# Patient Record
Sex: Male | Born: 1955 | Race: White | Hispanic: No | Smoking: Former smoker
Health system: Southern US, Community
[De-identification: ages and names within clinical notes are randomized; demographics above are authoritative.]

## PROBLEM LIST (undated history)

## (undated) DIAGNOSIS — IMO0002 Reserved for concepts with insufficient information to code with codable children: Secondary | ICD-10-CM

## (undated) DIAGNOSIS — L409 Psoriasis, unspecified: Secondary | ICD-10-CM

## (undated) DIAGNOSIS — D369 Benign neoplasm, unspecified site: Secondary | ICD-10-CM

## (undated) DIAGNOSIS — C61 Malignant neoplasm of prostate: Secondary | ICD-10-CM

## (undated) DIAGNOSIS — M87180 Osteonecrosis due to drugs, jaw: Secondary | ICD-10-CM

## (undated) DIAGNOSIS — K2970 Gastritis, unspecified, without bleeding: Secondary | ICD-10-CM

## (undated) DIAGNOSIS — I10 Essential (primary) hypertension: Secondary | ICD-10-CM

## (undated) DIAGNOSIS — Q631 Lobulated, fused and horseshoe kidney: Secondary | ICD-10-CM

## (undated) DIAGNOSIS — T50905A Adverse effect of unspecified drugs, medicaments and biological substances, initial encounter: Secondary | ICD-10-CM

## (undated) DIAGNOSIS — K219 Gastro-esophageal reflux disease without esophagitis: Secondary | ICD-10-CM

## (undated) DIAGNOSIS — J449 Chronic obstructive pulmonary disease, unspecified: Secondary | ICD-10-CM

## (undated) DIAGNOSIS — J45909 Unspecified asthma, uncomplicated: Secondary | ICD-10-CM

## (undated) DIAGNOSIS — M1990 Unspecified osteoarthritis, unspecified site: Secondary | ICD-10-CM

## (undated) DIAGNOSIS — F32A Depression, unspecified: Secondary | ICD-10-CM

## (undated) DIAGNOSIS — F419 Anxiety disorder, unspecified: Secondary | ICD-10-CM

## (undated) DIAGNOSIS — K5730 Diverticulosis of large intestine without perforation or abscess without bleeding: Secondary | ICD-10-CM

## (undated) DIAGNOSIS — I219 Acute myocardial infarction, unspecified: Secondary | ICD-10-CM

## (undated) DIAGNOSIS — F329 Major depressive disorder, single episode, unspecified: Secondary | ICD-10-CM

## (undated) DIAGNOSIS — I2510 Atherosclerotic heart disease of native coronary artery without angina pectoris: Secondary | ICD-10-CM

## (undated) DIAGNOSIS — K648 Other hemorrhoids: Secondary | ICD-10-CM

## (undated) HISTORY — PX: ANTERIOR FUSION CERVICAL SPINE: SUR626

## (undated) HISTORY — PX: OTHER SURGICAL HISTORY: SHX169

## (undated) HISTORY — DX: Lobulated, fused and horseshoe kidney: Q63.1

## (undated) HISTORY — DX: Diverticulosis of large intestine without perforation or abscess without bleeding: K57.30

## (undated) HISTORY — DX: Other hemorrhoids: K64.8

## (undated) HISTORY — DX: Chronic obstructive pulmonary disease, unspecified: J44.9

## (undated) HISTORY — DX: Reserved for concepts with insufficient information to code with codable children: IMO0002

## (undated) HISTORY — DX: Atherosclerotic heart disease of native coronary artery without angina pectoris: I25.10

## (undated) HISTORY — DX: Unspecified osteoarthritis, unspecified site: M19.90

## (undated) HISTORY — DX: Gastritis, unspecified, without bleeding: K29.70

## (undated) HISTORY — PX: POSTERIOR FUSION CERVICAL SPINE: SUR628

## (undated) HISTORY — DX: Malignant neoplasm of prostate: C61

## (undated) HISTORY — PX: APPENDECTOMY: SHX54

## (undated) HISTORY — DX: Anxiety disorder, unspecified: F41.9

## (undated) HISTORY — DX: Adverse effect of unspecified drugs, medicaments and biological substances, initial encounter: T50.905A

## (undated) HISTORY — DX: Essential (primary) hypertension: I10

## (undated) HISTORY — DX: Gastro-esophageal reflux disease without esophagitis: K21.9

## (undated) HISTORY — DX: Benign neoplasm, unspecified site: D36.9

## (undated) HISTORY — DX: Osteonecrosis due to drugs, jaw: M87.180

## (undated) HISTORY — DX: Depression, unspecified: F32.A

## (undated) HISTORY — DX: Psoriasis, unspecified: L40.9

## (undated) HISTORY — DX: Major depressive disorder, single episode, unspecified: F32.9

---

## 1999-08-23 ENCOUNTER — Encounter: Payer: Self-pay | Admitting: Internal Medicine

## 1999-08-23 ENCOUNTER — Ambulatory Visit (HOSPITAL_COMMUNITY): Admission: RE | Admit: 1999-08-23 | Discharge: 1999-08-23 | Payer: Self-pay | Admitting: Internal Medicine

## 1999-08-30 ENCOUNTER — Encounter: Payer: Self-pay | Admitting: Emergency Medicine

## 1999-08-30 ENCOUNTER — Emergency Department (HOSPITAL_COMMUNITY): Admission: EM | Admit: 1999-08-30 | Discharge: 1999-08-30 | Payer: Self-pay | Admitting: Emergency Medicine

## 2002-05-11 ENCOUNTER — Encounter: Payer: Self-pay | Admitting: Emergency Medicine

## 2002-05-11 ENCOUNTER — Emergency Department (HOSPITAL_COMMUNITY): Admission: EM | Admit: 2002-05-11 | Discharge: 2002-05-11 | Payer: Self-pay | Admitting: Emergency Medicine

## 2004-10-08 ENCOUNTER — Emergency Department (HOSPITAL_COMMUNITY): Admission: EM | Admit: 2004-10-08 | Discharge: 2004-10-08 | Payer: Self-pay | Admitting: Emergency Medicine

## 2005-09-27 ENCOUNTER — Ambulatory Visit (HOSPITAL_COMMUNITY): Admission: RE | Admit: 2005-09-27 | Discharge: 2005-09-27 | Payer: Self-pay | Admitting: Family Medicine

## 2005-11-19 ENCOUNTER — Ambulatory Visit: Payer: Self-pay | Admitting: Orthopedic Surgery

## 2005-11-23 ENCOUNTER — Ambulatory Visit (HOSPITAL_COMMUNITY): Admission: RE | Admit: 2005-11-23 | Discharge: 2005-11-23 | Payer: Self-pay | Admitting: Orthopedic Surgery

## 2005-12-13 ENCOUNTER — Ambulatory Visit: Payer: Self-pay | Admitting: Orthopedic Surgery

## 2005-12-21 ENCOUNTER — Ambulatory Visit (HOSPITAL_COMMUNITY): Admission: RE | Admit: 2005-12-21 | Discharge: 2005-12-21 | Payer: Self-pay | Admitting: Gastroenterology

## 2005-12-21 ENCOUNTER — Ambulatory Visit: Payer: Self-pay | Admitting: Gastroenterology

## 2005-12-25 ENCOUNTER — Encounter (INDEPENDENT_AMBULATORY_CARE_PROVIDER_SITE_OTHER): Payer: Self-pay | Admitting: *Deleted

## 2005-12-25 ENCOUNTER — Other Ambulatory Visit: Admission: RE | Admit: 2005-12-25 | Discharge: 2005-12-25 | Payer: Self-pay | Admitting: Urology

## 2006-01-11 ENCOUNTER — Encounter (HOSPITAL_COMMUNITY): Admission: RE | Admit: 2006-01-11 | Discharge: 2006-02-10 | Payer: Self-pay | Admitting: Urology

## 2006-02-28 HISTORY — PX: PROSTATECTOMY: SHX69

## 2006-07-19 ENCOUNTER — Encounter (HOSPITAL_COMMUNITY): Admission: RE | Admit: 2006-07-19 | Discharge: 2006-08-18 | Payer: Self-pay | Admitting: Oncology

## 2006-07-19 ENCOUNTER — Ambulatory Visit: Admission: RE | Admit: 2006-07-19 | Discharge: 2006-09-24 | Payer: Self-pay | Admitting: Radiation Oncology

## 2006-07-19 ENCOUNTER — Ambulatory Visit (HOSPITAL_COMMUNITY): Payer: Self-pay | Admitting: Oncology

## 2006-08-30 ENCOUNTER — Encounter (HOSPITAL_COMMUNITY): Admission: RE | Admit: 2006-08-30 | Discharge: 2006-09-29 | Payer: Self-pay | Admitting: Oncology

## 2006-10-11 ENCOUNTER — Ambulatory Visit (HOSPITAL_COMMUNITY): Payer: Self-pay | Admitting: Oncology

## 2006-10-11 ENCOUNTER — Encounter (HOSPITAL_COMMUNITY): Admission: RE | Admit: 2006-10-11 | Discharge: 2006-11-10 | Payer: Self-pay | Admitting: Oncology

## 2006-11-12 ENCOUNTER — Encounter (HOSPITAL_COMMUNITY): Admission: RE | Admit: 2006-11-12 | Discharge: 2006-12-12 | Payer: Self-pay | Admitting: Oncology

## 2006-12-09 ENCOUNTER — Ambulatory Visit (HOSPITAL_COMMUNITY): Admission: RE | Admit: 2006-12-09 | Discharge: 2006-12-09 | Payer: Self-pay | Admitting: Neurosurgery

## 2006-12-10 ENCOUNTER — Ambulatory Visit (HOSPITAL_COMMUNITY): Admission: RE | Admit: 2006-12-10 | Discharge: 2006-12-10 | Payer: Self-pay | Admitting: Family Medicine

## 2006-12-17 ENCOUNTER — Encounter (HOSPITAL_COMMUNITY): Admission: RE | Admit: 2006-12-17 | Discharge: 2007-01-16 | Payer: Self-pay | Admitting: Oncology

## 2006-12-17 ENCOUNTER — Ambulatory Visit (HOSPITAL_COMMUNITY): Payer: Self-pay | Admitting: Oncology

## 2007-03-11 ENCOUNTER — Inpatient Hospital Stay (HOSPITAL_COMMUNITY): Admission: RE | Admit: 2007-03-11 | Discharge: 2007-03-13 | Payer: Self-pay | Admitting: Neurosurgery

## 2007-04-15 ENCOUNTER — Ambulatory Visit (HOSPITAL_COMMUNITY): Payer: Self-pay | Admitting: Oncology

## 2007-04-15 ENCOUNTER — Encounter (HOSPITAL_COMMUNITY): Admission: RE | Admit: 2007-04-15 | Discharge: 2007-05-15 | Payer: Self-pay | Admitting: Oncology

## 2007-07-14 ENCOUNTER — Ambulatory Visit (HOSPITAL_COMMUNITY): Payer: Self-pay | Admitting: Oncology

## 2007-07-14 ENCOUNTER — Encounter (HOSPITAL_COMMUNITY): Admission: RE | Admit: 2007-07-14 | Discharge: 2007-08-13 | Payer: Self-pay | Admitting: Oncology

## 2007-08-11 ENCOUNTER — Ambulatory Visit (HOSPITAL_COMMUNITY): Admission: RE | Admit: 2007-08-11 | Discharge: 2007-08-11 | Payer: Self-pay | Admitting: Neurosurgery

## 2007-08-27 ENCOUNTER — Encounter (HOSPITAL_COMMUNITY): Admission: RE | Admit: 2007-08-27 | Discharge: 2007-09-26 | Payer: Self-pay | Admitting: Oncology

## 2007-08-30 ENCOUNTER — Ambulatory Visit (HOSPITAL_COMMUNITY): Admission: RE | Admit: 2007-08-30 | Discharge: 2007-08-30 | Payer: Self-pay | Admitting: Family Medicine

## 2007-10-02 ENCOUNTER — Encounter (HOSPITAL_COMMUNITY): Admission: RE | Admit: 2007-10-02 | Discharge: 2007-11-01 | Payer: Self-pay | Admitting: Neurosurgery

## 2007-10-08 ENCOUNTER — Encounter (HOSPITAL_COMMUNITY): Admission: RE | Admit: 2007-10-08 | Discharge: 2007-11-07 | Payer: Self-pay | Admitting: Oncology

## 2007-10-08 ENCOUNTER — Ambulatory Visit (HOSPITAL_COMMUNITY): Payer: Self-pay | Admitting: Oncology

## 2007-11-03 ENCOUNTER — Encounter (HOSPITAL_COMMUNITY): Admission: RE | Admit: 2007-11-03 | Discharge: 2007-12-03 | Payer: Self-pay | Admitting: Neurosurgery

## 2008-02-17 ENCOUNTER — Ambulatory Visit (HOSPITAL_COMMUNITY): Payer: Self-pay | Admitting: Oncology

## 2008-02-17 ENCOUNTER — Encounter (HOSPITAL_COMMUNITY): Admission: RE | Admit: 2008-02-17 | Discharge: 2008-03-01 | Payer: Self-pay | Admitting: Oncology

## 2008-04-01 ENCOUNTER — Ambulatory Visit (HOSPITAL_COMMUNITY): Admission: RE | Admit: 2008-04-01 | Discharge: 2008-04-01 | Payer: Self-pay | Admitting: Family Medicine

## 2008-05-24 ENCOUNTER — Encounter (HOSPITAL_COMMUNITY): Admission: RE | Admit: 2008-05-24 | Discharge: 2008-06-23 | Payer: Self-pay | Admitting: Oncology

## 2008-05-24 ENCOUNTER — Ambulatory Visit (HOSPITAL_COMMUNITY): Payer: Self-pay | Admitting: Oncology

## 2008-06-08 ENCOUNTER — Ambulatory Visit (HOSPITAL_COMMUNITY): Admission: RE | Admit: 2008-06-08 | Discharge: 2008-06-08 | Payer: Self-pay | Admitting: Family Medicine

## 2008-07-09 ENCOUNTER — Inpatient Hospital Stay (HOSPITAL_COMMUNITY): Admission: RE | Admit: 2008-07-09 | Discharge: 2008-07-10 | Payer: Self-pay | Admitting: Neurosurgery

## 2008-08-12 ENCOUNTER — Encounter (HOSPITAL_COMMUNITY): Admission: RE | Admit: 2008-08-12 | Discharge: 2008-09-11 | Payer: Self-pay | Admitting: Oncology

## 2008-08-12 ENCOUNTER — Ambulatory Visit (HOSPITAL_COMMUNITY): Payer: Self-pay | Admitting: Oncology

## 2008-11-22 ENCOUNTER — Ambulatory Visit (HOSPITAL_COMMUNITY): Payer: Self-pay | Admitting: Oncology

## 2008-11-22 ENCOUNTER — Encounter (HOSPITAL_COMMUNITY): Admission: RE | Admit: 2008-11-22 | Discharge: 2008-12-22 | Payer: Self-pay | Admitting: Oncology

## 2009-01-31 ENCOUNTER — Ambulatory Visit (HOSPITAL_COMMUNITY): Payer: Self-pay | Admitting: Oncology

## 2009-01-31 ENCOUNTER — Encounter (HOSPITAL_COMMUNITY): Admission: RE | Admit: 2009-01-31 | Discharge: 2009-03-02 | Payer: Self-pay | Admitting: Oncology

## 2009-02-08 ENCOUNTER — Ambulatory Visit (HOSPITAL_COMMUNITY): Admission: RE | Admit: 2009-02-08 | Discharge: 2009-02-08 | Payer: Self-pay | Admitting: Oncology

## 2009-05-02 ENCOUNTER — Ambulatory Visit (HOSPITAL_COMMUNITY): Payer: Self-pay | Admitting: Oncology

## 2009-05-02 ENCOUNTER — Encounter (HOSPITAL_COMMUNITY): Admission: RE | Admit: 2009-05-02 | Discharge: 2009-06-01 | Payer: Self-pay | Admitting: Oncology

## 2009-06-14 ENCOUNTER — Ambulatory Visit (HOSPITAL_COMMUNITY): Payer: Self-pay | Admitting: Oncology

## 2009-06-14 ENCOUNTER — Encounter (HOSPITAL_COMMUNITY): Admission: RE | Admit: 2009-06-14 | Discharge: 2009-07-14 | Payer: Self-pay | Admitting: Oncology

## 2009-08-03 ENCOUNTER — Ambulatory Visit (HOSPITAL_COMMUNITY): Payer: Self-pay | Admitting: Oncology

## 2009-08-10 ENCOUNTER — Encounter (HOSPITAL_COMMUNITY): Admission: RE | Admit: 2009-08-10 | Discharge: 2009-09-09 | Payer: Self-pay | Admitting: Oncology

## 2009-08-19 ENCOUNTER — Encounter (HOSPITAL_COMMUNITY): Admission: RE | Admit: 2009-08-19 | Discharge: 2009-09-18 | Payer: Self-pay | Admitting: Oncology

## 2009-09-13 ENCOUNTER — Encounter (HOSPITAL_COMMUNITY): Admission: RE | Admit: 2009-09-13 | Discharge: 2009-10-13 | Payer: Self-pay | Admitting: Oncology

## 2009-09-28 ENCOUNTER — Encounter (HOSPITAL_COMMUNITY): Admission: RE | Admit: 2009-09-28 | Discharge: 2009-10-28 | Payer: Self-pay | Admitting: Oncology

## 2009-09-28 ENCOUNTER — Ambulatory Visit (HOSPITAL_COMMUNITY): Payer: Self-pay | Admitting: Oncology

## 2009-10-14 ENCOUNTER — Encounter (HOSPITAL_COMMUNITY)
Admission: RE | Admit: 2009-10-14 | Discharge: 2009-11-13 | Payer: Self-pay | Source: Home / Self Care | Admitting: Oncology

## 2009-11-08 ENCOUNTER — Ambulatory Visit (HOSPITAL_COMMUNITY): Admission: RE | Admit: 2009-11-08 | Discharge: 2009-11-08 | Payer: Self-pay | Admitting: Neurosurgery

## 2009-11-15 ENCOUNTER — Emergency Department (HOSPITAL_COMMUNITY): Admission: EM | Admit: 2009-11-15 | Discharge: 2009-11-15 | Payer: Self-pay | Admitting: Emergency Medicine

## 2009-11-21 ENCOUNTER — Ambulatory Visit (HOSPITAL_COMMUNITY): Payer: Self-pay | Admitting: Oncology

## 2009-12-16 ENCOUNTER — Encounter (HOSPITAL_COMMUNITY): Admission: RE | Admit: 2009-12-16 | Discharge: 2010-01-15 | Payer: Self-pay | Admitting: Oncology

## 2010-01-13 ENCOUNTER — Ambulatory Visit (HOSPITAL_COMMUNITY): Payer: Self-pay | Admitting: Oncology

## 2010-03-14 ENCOUNTER — Encounter (HOSPITAL_COMMUNITY)
Admission: RE | Admit: 2010-03-14 | Discharge: 2010-04-13 | Payer: Self-pay | Source: Home / Self Care | Admitting: Oncology

## 2010-03-14 ENCOUNTER — Ambulatory Visit (HOSPITAL_COMMUNITY): Payer: Self-pay | Admitting: Oncology

## 2010-04-11 ENCOUNTER — Ambulatory Visit: Payer: Self-pay | Admitting: Gastroenterology

## 2010-04-11 DIAGNOSIS — K625 Hemorrhage of anus and rectum: Secondary | ICD-10-CM | POA: Insufficient documentation

## 2010-04-11 DIAGNOSIS — K219 Gastro-esophageal reflux disease without esophagitis: Secondary | ICD-10-CM | POA: Insufficient documentation

## 2010-05-03 ENCOUNTER — Ambulatory Visit (HOSPITAL_COMMUNITY)
Admission: RE | Admit: 2010-05-03 | Discharge: 2010-05-03 | Payer: Self-pay | Source: Home / Self Care | Admitting: Gastroenterology

## 2010-05-08 ENCOUNTER — Ambulatory Visit (HOSPITAL_COMMUNITY)
Admission: RE | Admit: 2010-05-08 | Discharge: 2010-05-08 | Payer: Self-pay | Source: Home / Self Care | Admitting: Family Medicine

## 2010-05-22 ENCOUNTER — Ambulatory Visit: Payer: Self-pay | Admitting: Internal Medicine

## 2010-05-23 ENCOUNTER — Encounter (INDEPENDENT_AMBULATORY_CARE_PROVIDER_SITE_OTHER): Payer: Self-pay | Admitting: *Deleted

## 2010-05-25 ENCOUNTER — Encounter: Payer: Self-pay | Admitting: Internal Medicine

## 2010-06-02 ENCOUNTER — Ambulatory Visit (HOSPITAL_COMMUNITY): Payer: Self-pay | Admitting: Oncology

## 2010-06-02 ENCOUNTER — Encounter (HOSPITAL_COMMUNITY)
Admission: RE | Admit: 2010-06-02 | Discharge: 2010-07-02 | Payer: Self-pay | Source: Home / Self Care | Attending: Oncology | Admitting: Oncology

## 2010-06-04 HISTORY — PX: OTHER SURGICAL HISTORY: SHX169

## 2010-06-07 ENCOUNTER — Emergency Department (HOSPITAL_COMMUNITY)
Admission: EM | Admit: 2010-06-07 | Discharge: 2010-06-08 | Payer: Self-pay | Source: Home / Self Care | Admitting: Emergency Medicine

## 2010-06-08 ENCOUNTER — Emergency Department (HOSPITAL_COMMUNITY)
Admission: RE | Admit: 2010-06-08 | Discharge: 2010-06-08 | Payer: Self-pay | Source: Home / Self Care | Attending: Internal Medicine | Admitting: Internal Medicine

## 2010-06-08 LAB — BASIC METABOLIC PANEL
BUN: 6 mg/dL (ref 6–23)
CO2: 19 mEq/L (ref 19–32)
Calcium: 8.8 mg/dL (ref 8.4–10.5)
Chloride: 111 mEq/L (ref 96–112)
Creatinine, Ser: 1.06 mg/dL (ref 0.4–1.5)
GFR calc Af Amer: >60 mL/min (ref >60)
GFR calc non Af Amer: >60 mL/min (ref >60)
Glucose, Bld: 95 mg/dL (ref 70–99)
Potassium: 3.6 mEq/L (ref 3.5–5.1)
Sodium: 141 mEq/L (ref 135–145)

## 2010-06-08 LAB — DIFFERENTIAL
Basophils Absolute: 0 10*3/uL (ref 0.0–0.1)
Basophils Relative: 0 % (ref 0–1)
Eosinophils Absolute: 0 10*3/uL (ref 0.0–0.7)
Eosinophils Relative: 1 % (ref 0–5)
Lymphocytes Relative: 24 % (ref 12–46)
Lymphs Abs: 1.6 10*3/uL (ref 0.7–4.0)
Monocytes Absolute: 0.6 10*3/uL (ref 0.1–1.0)
Monocytes Relative: 9 % (ref 3–12)
Neutro Abs: 4.2 10*3/uL (ref 1.7–7.7)
Neutrophils Relative %: 66 % (ref 43–77)

## 2010-06-08 LAB — POCT CARDIAC MARKERS
CKMB, poc: 1.8 ng/mL (ref 1.0–8.0)
Myoglobin, poc: 128 ng/mL (ref 12–200)
Troponin i, poc: <0.05 ng/mL (ref 0.00–0.09)

## 2010-06-08 LAB — CBC
HCT: 33.6 % — ABNORMAL LOW (ref 39.0–52.0)
Hemoglobin: 11.7 g/dL — ABNORMAL LOW (ref 13.0–17.0)
MCH: 30.7 pg (ref 26.0–34.0)
MCHC: 34.8 g/dL (ref 30.0–36.0)
MCV: 88.2 fL (ref 78.0–100.0)
Platelets: 260 10*3/uL (ref 150–400)
RBC: 3.81 MIL/uL — ABNORMAL LOW (ref 4.22–5.81)
RDW: 12.8 % (ref 11.5–15.5)
WBC: 6.4 10*3/uL (ref 4.0–10.5)

## 2010-06-08 LAB — D-DIMER, QUANTITATIVE: D-Dimer, Quant: 0.39 ug/mL-FEU (ref 0.00–0.48)

## 2010-06-21 ENCOUNTER — Encounter: Payer: Self-pay | Admitting: Cardiology

## 2010-06-21 ENCOUNTER — Ambulatory Visit
Admission: RE | Admit: 2010-06-21 | Discharge: 2010-06-21 | Payer: Self-pay | Source: Home / Self Care | Attending: Cardiology | Admitting: Cardiology

## 2010-06-22 ENCOUNTER — Encounter: Payer: Self-pay | Admitting: Cardiology

## 2010-06-28 ENCOUNTER — Inpatient Hospital Stay (HOSPITAL_COMMUNITY)
Admission: AD | Admit: 2010-06-28 | Discharge: 2010-07-04 | Disposition: A | Discharge status: Home / Self Care | Payer: Self-pay | Attending: Internal Medicine | Admitting: Internal Medicine

## 2010-06-28 ENCOUNTER — Ambulatory Visit (HOSPITAL_COMMUNITY): Admission: RE | Admit: 2010-06-28 | Payer: Self-pay | Source: Home / Self Care | Admitting: Cardiology

## 2010-06-28 ENCOUNTER — Encounter (HOSPITAL_COMMUNITY)
Admission: RE | Admit: 2010-06-28 | Discharge: 2010-07-04 | Payer: Self-pay | Source: Home / Self Care | Attending: Cardiology | Admitting: Cardiology

## 2010-06-28 ENCOUNTER — Emergency Department (HOSPITAL_COMMUNITY)
Admission: EM | Admit: 2010-06-28 | Discharge: 2010-06-28 | Disposition: A | Discharge status: Other Acute Inpatient Hospital | Payer: Self-pay | Source: Home / Self Care | Admitting: Emergency Medicine

## 2010-06-28 LAB — CBC
HCT: 34.6 % — ABNORMAL LOW (ref 39.0–52.0)
Hemoglobin: 12.1 g/dL — ABNORMAL LOW (ref 13.0–17.0)
MCH: 31.1 pg (ref 26.0–34.0)
MCHC: 35 g/dL (ref 30.0–36.0)
MCV: 88.9 fL (ref 78.0–100.0)
Platelets: 284 10*3/uL (ref 150–400)
RBC: 3.89 MIL/uL — ABNORMAL LOW (ref 4.22–5.81)
RDW: 12.7 % (ref 11.5–15.5)
WBC: 7.2 10*3/uL (ref 4.0–10.5)

## 2010-06-28 LAB — BASIC METABOLIC PANEL
BUN: 7 mg/dL (ref 6–23)
CO2: 23 mEq/L (ref 19–32)
Calcium: 8.6 mg/dL (ref 8.4–10.5)
Chloride: 103 mEq/L (ref 96–112)
Creatinine, Ser: 0.99 mg/dL (ref 0.4–1.5)
GFR calc Af Amer: >60 mL/min (ref >60)
GFR calc non Af Amer: >60 mL/min (ref >60)
Glucose, Bld: 118 mg/dL — ABNORMAL HIGH (ref 70–99)
Potassium: 3.4 mEq/L — ABNORMAL LOW (ref 3.5–5.1)
Sodium: 136 mEq/L (ref 135–145)

## 2010-06-28 LAB — POCT CARDIAC MARKERS
CKMB, poc: <1 ng/mL — ABNORMAL LOW (ref 1.0–8.0)
CKMB, poc: <1 ng/mL — ABNORMAL LOW (ref 1.0–8.0)
Myoglobin, poc: 51.1 ng/mL (ref 12–200)
Myoglobin, poc: 86.9 ng/mL (ref 12–200)
Troponin i, poc: <0.05 ng/mL (ref 0.00–0.09)
Troponin i, poc: <0.05 ng/mL (ref 0.00–0.09)

## 2010-06-28 LAB — PROTIME-INR
INR: 0.97 (ref 0.00–1.49)
Prothrombin Time: 13.1 seconds (ref 11.6–15.2)

## 2010-06-28 LAB — DIFFERENTIAL
Basophils Absolute: 0.1 10*3/uL (ref 0.0–0.1)
Basophils Relative: 1 % (ref 0–1)
Eosinophils Absolute: 0.3 10*3/uL (ref 0.0–0.7)
Eosinophils Relative: 4 % (ref 0–5)
Lymphocytes Relative: 46 % (ref 12–46)
Lymphs Abs: 3.3 10*3/uL (ref 0.7–4.0)
Monocytes Absolute: 0.5 10*3/uL (ref 0.1–1.0)
Monocytes Relative: 7 % (ref 3–12)
Neutro Abs: 3.1 10*3/uL (ref 1.7–7.7)
Neutrophils Relative %: 43 % (ref 43–77)

## 2010-06-28 LAB — MRSA PCR SCREENING: MRSA by PCR: NEGATIVE

## 2010-06-29 LAB — CBC
HCT: 34.1 % — ABNORMAL LOW (ref 39.0–52.0)
Hemoglobin: 11.2 g/dL — ABNORMAL LOW (ref 13.0–17.0)
MCH: 29.7 pg (ref 26.0–34.0)
MCHC: 32.8 g/dL (ref 30.0–36.0)
MCV: 90.5 fL (ref 78.0–100.0)
Platelets: 261 10*3/uL (ref 150–400)
RBC: 3.77 MIL/uL — ABNORMAL LOW (ref 4.22–5.81)
RDW: 12.8 % (ref 11.5–15.5)
WBC: 6.2 10*3/uL (ref 4.0–10.5)

## 2010-06-29 LAB — BASIC METABOLIC PANEL
BUN: 9 mg/dL (ref 6–23)
CO2: 25 mEq/L (ref 19–32)
Calcium: 8.1 mg/dL — ABNORMAL LOW (ref 8.4–10.5)
Chloride: 113 mEq/L — ABNORMAL HIGH (ref 96–112)
Creatinine, Ser: 0.98 mg/dL (ref 0.4–1.5)
GFR calc Af Amer: >60 mL/min (ref >60)
GFR calc non Af Amer: >60 mL/min (ref >60)
Glucose, Bld: 92 mg/dL (ref 70–99)
Potassium: 3.8 mEq/L (ref 3.5–5.1)
Sodium: 143 mEq/L (ref 135–145)

## 2010-06-30 ENCOUNTER — Encounter: Payer: Self-pay | Admitting: Internal Medicine

## 2010-06-30 ENCOUNTER — Encounter: Payer: Self-pay | Admitting: Gastroenterology

## 2010-06-30 NOTE — H&P (Addendum)
[name], [name] NO.:  [id number]  MEDICAL RECORD NO.:  [id number]          PATIENT TYPE:  EMS  LOCATION:  ED                            FACILITY:  [facility]  PHYSICIAN:  Jonelle Sidle, MD DATE OF BIRTH:  [birth year]  DATE OF ADMISSION:  06/28/2010 DATE OF DISCHARGE:  LH                             HISTORY & PHYSICAL   PRIMARY CARE PHYSICIAN:  [name], M.D.  ONCOLOGIST:  Ladona Horns. Neijstrom, MD.  REASON FOR ADMISSION:  Chest pain concerning for unstable angina.  HISTORY OF PRESENT ILLNESS:  [name] is a 55 year old male, recently seen by me in consultation in the office on June 21, 2010, with symptoms of chest pain and shortness of breath that have been progressive over the last several weeks.  These symptoms are described with both typical and atypical features, and noted in the setting of anxiety, and plan for a gastroenterology evaluation including EGD and colonoscopy based on additional reported symptoms of dysphagia and hematochezia.  In fact, he was originally referred to me in light of chest pain symptoms resulting in cancellation of an EGD and emergency department visit.  Please refer to my full consultation note from June 21, 2010, for additional details.  The patient was referred for a Myoview study, which was to be performed earlier today.  He presented for the initial rest portion of the study, and during this time began to experience chest discomfort consistent with his previous symptoms, left arm discomfort, diaphoresis, and anxiety.  This study was cancelled and he was transported from radiology to the emergency department at [facility] for further assessment.  He continues to complain of intermittent chest discomfort, although his ECG does not show any acute ST-segment changes, and initial point of care cardiac markers are normal.  I discussed the situation with the patient, and also by phone with Dr. Mariel Sleet.  Main  question remains as to whether his symptoms are related to significant obstructive underlying coronary artery disease.  We discussed transfer to [facility] to undergo a diagnostic cardiac catheterization, for clear assessment of the patient's coronary anatomy, and then determine best course of action from there.  The patient is in agreement to proceed, and Dr. Mariel Sleet reports no major reservations with this plan.  ALLERGIES:  ZOMETA.  Past medical history, social history, surgical history and family history were just completely reviewed in my cardiology consultation note from June 21, 2010, and will not be redictated here.  Please refer to this documented for full discussion.  There has otherwise been no major interval change.  REVIEW OF SYSTEMS:  The patient continues to have intermittent chest discomfort.  Denies any recent bloody stools on baseline reported history of hematochezia.  No cough, hemoptysis, fevers or chills. Reports stable appetite.  He does have intermittent anxiety symptoms.  PHYSICAL EXAMINATION:  VITAL SIGNS:  The patient is hemodynamically stable with temperature 97.9 degrees, blood pressure 118/81, heart rate 60 in sinus rhythm by telemetry and respirations 16 with oxygen saturation of 100% on 2 liters nasal cannula. GENERAL APPEARANCE:  He is a chronically ill-appearing male  in no acute distress. HEENT:  Conjunctivae and lids are normal.  Oropharynx with poor dentition. NECK:  Supple.  No elevated JVP or significant carotid bruits.  No thyromegaly. LUNGS:  Exhibit diminished breath sounds but nonlabored breathing at rest and no wheezing. CARDIAC:  Exam reveals a regular rate and rhythm, soft basal systolic murmur, preserved second heart sound, no S3. ABDOMEN:  Soft, nontender.  Bowel sounds present. SKIN:  Warm and dry, scattered ecchymoses on the forearms are less prominent compared to his prior exam.  No petechiae. MUSCULOSKELETAL:  No  kyphosis is noted. EXTREMITIES:  No pitting edema.  Distal pulses are 1+. NEUROPSYCHIATRIC:  The patient alert and oriented x3.  Affect appropriate to situation.  LABORATORY DATA:  WBC 7.2, hemoglobin 12.1, hematocrit 34.6, MCV 88.9, platelets 284.  Sodium 136, potassium 3.4, chloride 103, bicarb 23, glucose 118, BUN 7, creatinine 0.9.  Point of care CK-MB is less than 1.0.  Point of care troponin I is less than 0.05.  Chest x-ray from June 28, 2010, reports bronchitic and emphysematous changes with no acute abnormality.  ECG from today shows normal sinus rhythm at 63 beats per minute with some lead motion artifact and nonspecific T-wave changes.  IMPRESSION: 1. Progressive symptoms of chest pain and shortness of breath, also     associated with diaphoresis and anxiety as outlined above,     concerning for unstable angina, although associated with a     nonspecific electrocardiogram and normal cardiac markers.  Cardiac     risk factors include a significant family history of premature     cardiovascular disease, gender, and former tobacco use.  Following     his original office consultation, noninvasive cardiac testing was     arranged, however, this was not able to be completed related to     recurrent symptoms. 2. History of metastatic prostate cancer, stage IV, status post     radical prostatectomy in 2007, and reportedly quite stable with a     PSA of essentially 0 in close follow-up with Dr. Mariel Sleet. 3. Reported history of dysphasia and hematochezia, gastroenterology     workup deferred related to chest pain symptoms.  Hemoglobin is     noted to be 12.1 with normal MCV. 4. History of anxiety and depression. 5. Reported history of chronic obstructive pulmonary disease, prior     tobacco use. 6. History of degenerative disk disease status post anterior and     posterior cervical fusions. 7. Reported history of "easy bleeding", not on any antiplatelet drugs     long-term.   No proven bleeding diathesis, however.  I discussed     this with Dr. Mariel Sleet, and he voiced no major concerns with the     patient potentially being on aspirin and Plavix were the need to     arise.  This is of course a separate issue from having     gastroenterology clearance to use these medications pending further     evaluation.  PLAN:  I reviewed the situation in detail with the patient.  I also discussed things by phone with Dr. Mariel Sleet and Dr. Riley Kill in the Cardiac Catheterization Lab at [facility].  The plan at this point will be to transfer Mr. Lisbon to [facility] today in anticipation of a diagnostic cardiac catheterization to clearly document his coronary anatomy and help guide further evaluation and management.  If he has nonobstructive disease, then would search for other causes of chest pain and likely  proceed with a gastroenterology evaluation in light of his aforementioned symptoms of dysphagia and hematochezia.  If, on the other hand, he has significant obstructive disease in need of revascularization, then the situation becomes more complicated as he will need further discussion with gastroenterology as it relates to timing  of further assessment of his GI tract prior to potential use of antiplatelet  agents long-term.  The patient is being kept n.p.o. at this point and hydrated  in anticipation of his diagnostic cardiac catheterization later today.     Jonelle Sidle, MD     SGM/MEDQ  D:  06/28/2010  T:  06/28/2010  Job:  [id number]  cc:   [name], M.D. Fax: [phone]  Ladona Horns. Mariel Sleet, MD Fax: [phone]  Electronically Signed by Nona Dell MD on 06/28/2010 02:12:35 PM

## 2010-07-02 LAB — CBC
HCT: 36.7 % — ABNORMAL LOW (ref 39.0–52.0)
Hemoglobin: 11.8 g/dL — ABNORMAL LOW (ref 13.0–17.0)
MCH: 29.4 pg (ref 26.0–34.0)
MCHC: 32.2 g/dL (ref 30.0–36.0)
MCV: 91.3 fL (ref 78.0–100.0)
Platelets: 252 10*3/uL (ref 150–400)
RBC: 4.02 MIL/uL — ABNORMAL LOW (ref 4.22–5.81)
RDW: 12.7 % (ref 11.5–15.5)
WBC: 6.2 10*3/uL (ref 4.0–10.5)

## 2010-07-02 LAB — COMPREHENSIVE METABOLIC PANEL
ALT: 18 U/L (ref 0–53)
AST: 19 U/L (ref 0–37)
Albumin: 3.7 g/dL (ref 3.5–5.2)
Alkaline Phosphatase: 50 U/L (ref 39–117)
BUN: 7 mg/dL (ref 6–23)
CO2: 26 mEq/L (ref 19–32)
Calcium: 9 mg/dL (ref 8.4–10.5)
Chloride: 107 mEq/L (ref 96–112)
Creatinine, Ser: 1 mg/dL (ref 0.4–1.5)
GFR calc Af Amer: >60 mL/min (ref >60)
GFR calc non Af Amer: >60 mL/min (ref >60)
Glucose, Bld: 94 mg/dL (ref 70–99)
Potassium: 3.9 mEq/L (ref 3.5–5.1)
Sodium: 141 mEq/L (ref 135–145)
Total Bilirubin: 0.3 mg/dL (ref 0.3–1.2)
Total Protein: 6.2 g/dL (ref 6.0–8.3)

## 2010-07-02 LAB — TROPONIN I: Troponin I: 0.01 ng/mL (ref 0.00–0.06)

## 2010-07-04 ENCOUNTER — Encounter: Payer: Self-pay | Admitting: Gastroenterology

## 2010-07-04 LAB — BASIC METABOLIC PANEL
BUN: 7 mg/dL (ref 6–23)
CO2: 26 mEq/L (ref 19–32)
Calcium: 8.2 mg/dL — ABNORMAL LOW (ref 8.4–10.5)
Chloride: 110 mEq/L (ref 96–112)
Creatinine, Ser: 0.92 mg/dL (ref 0.4–1.5)
GFR calc Af Amer: >60 mL/min (ref >60)
GFR calc non Af Amer: >60 mL/min (ref >60)
Glucose, Bld: 94 mg/dL (ref 70–99)
Potassium: 4 mEq/L (ref 3.5–5.1)
Sodium: 145 mEq/L (ref 135–145)

## 2010-07-04 LAB — CBC
HCT: 32.8 % — ABNORMAL LOW (ref 39.0–52.0)
Hemoglobin: 10.8 g/dL — ABNORMAL LOW (ref 13.0–17.0)
MCH: 29.7 pg (ref 26.0–34.0)
MCHC: 32.9 g/dL (ref 30.0–36.0)
MCV: 90.1 fL (ref 78.0–100.0)
Platelets: 243 10*3/uL (ref 150–400)
RBC: 3.64 MIL/uL — ABNORMAL LOW (ref 4.22–5.81)
RDW: 12.8 % (ref 11.5–15.5)
WBC: 5.9 10*3/uL (ref 4.0–10.5)

## 2010-07-04 NOTE — Progress Notes (Signed)
  NAMEHAVEN, FOSS                ACCOUNT NO.:  [id number]  MEDICAL RECORD NO.:  [id number]          PATIENT TYPE:  EMS  LOCATION:  ED                            FACILITY:  [facility]  PHYSICIAN:  R. Roetta Sessions, M.D. DATE OF BIRTH:  Oct 28, 1955  DATE OF PROCEDURE: DATE OF DISCHARGE:  06/08/2010                                PROGRESS NOTE   INDICATIONS FOR PROCEDURE:  The patient is a 55 year old gentleman who was seen in our office recently for esophageal dysphagia and intermittent rectal bleeding and set up for EGD with possible esophageal dilation and colonoscopy for today.  He apparently is having trouble with dyspnea and chest pain and last night presented to the emergency department and spent some time in the emergency department overnight and was discharged early this morning. He did take his colonoscopy prep.  He presented today for short stay for his endoscopy and appeared to be in acute distress and complaining of severe shortness of breath, chest pain and numbness and weakness on his left side (upper and lower extremities).  The 02 saturation was 100% on presentation.  However he appears to be in acute distress and has a distant history of pulmonary embolus. I spoke to Dr. Aileen Pilot in the emergency department and I am sending Mr. Worton down to the emergency department for further evaluation.  For now the EGD and colonoscopy have been cancelled. We will regroup once his acute symptoms have been sorted out.     Jonathon Bellows, M.D.     RMR/MEDQ  D:  06/08/2010  T:  06/08/2010  Job:  [id number]  Electronically Signed by Lorrin Goodell M.D. on 07/04/2010 01:43:07 PM

## 2010-07-05 LAB — POCT ACTIVATED CLOTTING TIME: Activated Clotting Time: 404 seconds

## 2010-07-05 NOTE — Discharge Summary (Addendum)
[name] [name]                ACCOUNT NO.:  [id number]  MEDICAL RECORD NO.:  [id number]          [name] TYPE:  INP  LOCATION:  6526                         FACILITY:  [facility]  PHYSICIAN:  [name] [name], M.D.DATE OF BIRTH:  1956-03-26  DATE OF ADMISSION:  06/28/2010 DATE OF DISCHARGE:  07/04/2010                              DISCHARGE SUMMARY   DISCHARGE DIAGNOSES: 1. Unstable angina. 2. Newly diagnosed coronary artery disease, status post overlapping     drug-eluting stent placement to [name] proximal and mid left anterior     descending artery, July 03, 2010. 3. Gastritis/gastroesophageal reflux disease by EGD on June 30, 2010 with no evidence for Helicobacter pylori. 4. Tubular adenoma by pathology as well as internal hemorrhoids by     colonoscopy on June 30, 2010, initiated on PPI. 5. History of sigmoid diverticulosis. 6. Metastatic prostate cancer, stage IV, status post radical     prostatectomy in September 2007. 7. Degenerative joint disease, status post anterior and posterior     cervical fusion. 8. Anxiety/depression. 9. Chronic obstructive pulmonary disease. 10.Psoriasis. 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 12.Status post left foot surgery. 13.Status post appendectomy.  HOSPITAL COURSE:  [name] [name] is a 55 year old gentleman who presented to Dr. Ival [name] office with complaints of both typical and atypical chest discomfort.  He had undergone [name] prep and was n.p.o. for EGD and colonoscopy recently when he began experiencing significant chest discomfort.  He reports a 1 year history of intermittent chest pressure in [name] left to right side usually noted in [name] evening, sometimes waking him up at night.  He also felt muscle cramps in his midsternal area sporadically.  He did have significant family history of premature coronary artery disease.  This is also in [name] setting of tobacco abuse and elevated blood pressure.  EKG was reassuring, but it was  felt that he would need further cardiovascular evaluation to rule out ischemic symptoms.  He underwent Myoview testing.  During [name] resting portion of study, he began experience chest discomfort consistent with his previous symptoms with arm discomfort, diaphoresis, and anxiety.  [name] study was cancelled and he was subsequently transferred to [facility].  EKG did not show any acute ST-segment changes.  Decision was made to transfer [name] [name] to [facility] to undergo diagnostic cardiac cath to clearly assess [name] [name]'s coronary anatomy.  He was transferred and enzymes upon arrival to [facility] were negative.    He underwent diagnostic cath on June 28, 2010, which showed scattered coronary anatomy, abnormalities involving RCA with 40% proximal narrowing as well as moderately high-grade proximal mid LAD stenosis that did not appear to be critical, but likely flow limiting.  However, prior to proceeding with PCI, it was felt that [name] [name] would require GI evaluation to ensure stability for appropriateness of being on blood donner.  He underwent EGD and colonoscopy on June 30, 2010 with [name] above findings.  He was initiated initially on an H2 blocker, but this was changed to Protonix by Dr. Christella [name].  He felt that it was okay to use Plavix and [name] [name] subsequently  underwent IVUS of [name] LAD and overlapping Promus drug-eluting stent placement to [name] proximal and mid LAD by Dr. Excell [name].  After this procedure, [name] [name] was noted to have some confusion, but was not felt to have any signs of acute CVA.  This was helped by Ativan.  Dr. Deborah [name] felt that there may have been some component of narcotic withdrawal.  Because of his home regimen; however, on day of discharge, [name] confusion is resolved and [name] [name] was doing well.  Dr. Deborah [name] has seen and examined him today and feels he is stable for discharge.  [name] [name] also complained of left shoulder/arm pain  and was seen by Neurosurgery, Dr. Venetia [name].  MRI and plain films were obtained and [name] [name] will be instructed to follow his primary care doctor at followup for this problem.  Pain was stable on day of discharge.  DISCHARGE LABS:  WBC 5.9, hemoglobin 10.8, hematocrit 32.8, platelet count 243.  Sodium 145, potassium 4.0, chloride 110, CO2 26, glucose 94, BUN 7, creatinine 0.72.  LFTs within normal limits on July 02, 2010. Cardiac enzymes negative x2.  STUDIES: 1. Chest x-ray on June 28, 2010 showed bronchitic and emphysematous     changes.  No acute abnormalities. 2. MRI of [name] C-spine on June 29, 2010, showed interval additional     posterior body and pedicle screw fixation from C5 through C7.  [name]     ACDF is unchanged, [name] spinal canal is decompressed, mild right     foraminal stenosis with associated uncovertebral sprain at C5     through C6, mild left foraminal stenosis C6-C7 associated with     uncovertebral sprain, C7 through T1 right greater than left,     bilateral facet necrosis. 3. C-spine, plain film on June 29, 2010, showed C5-C6 and C6-C7     anterior posterior fusion with no adverse procedures.  No abnormal     motion identified in [name] C-spine. 4. Cardiac catheterization on June 28, 2010 and July 03, 2010,     please see above for summary as well full report for details.  DISCHARGE MEDICATIONS: 1. Aspirin 81 mg daily. 2. Plavix 75 mg daily. 3. Nitroglycerin sublingual 0.4 mg every 5 minutes as needed up to     three doses. 4. Pravachol 40 mg nightly. 5. Protonix 40 mg daily. 6. Toprol-XL 25 mg daily. 7. Clobex 0.05% spray one application b.i.d. as needed for psoriasis. 8. Gabapentin 300 mg two capsules nightly. 9. Lupron 3.75 mg injection intramuscularly monthly. 10.Oxycodone CR 20 mg q.12 h. 11.Percocet 10/325 mg every 6 hours as needed. 12.Taclonex one application topically daily. 13.Xanax 0.5 mg t.i.d.  DISPOSITION:  [name] [name] will be  discharged in stable condition to home. He is not to lift anything for 1 week or participate in sexual activity for 1 week.  He is not to drive for 3 days.  He is to follow a heart- healthy diet and if he notices any pain, swelling, bleeding or pus at his cath site, he is to call or return.  He will follow up with Dr. Diona Browner on July 13, 2010, as previously scheduled at 8:40 a.m. and then follow up with either Dr. Gerda Diss or [name] neurosurgeon to discuss his back and shoulder pain within 1 week.  DURATION OF DISCHARGE ENCOUNTER:  Greater than 30 minutes including physician and PA time.     Dayna Dunn, P.A.C.   ______________________________ [name] [name], M.D.    DD/MEDQ  D:  07/04/2010  T:  07/05/2010  Job:  [id number]  cc:   Ladona Horns. Mariel Sleet, MD Jonelle Sidle, MD [name], M.D.  Electronically Signed by Ronie Spies  on 07/05/2010 09:30:57 PM Electronically Signed by Roger Shelter M.D. on 07/25/2010 09:12:15 AM

## 2010-07-06 NOTE — Letter (Signed)
Summary: [facility]mill (Nuc Med Stress)  [facility]  [address], [place] [address]   Phone: [phone]  Fax: [phone]    Nuclear Medicine 1-Day Stress Test Information Sheet  Re:     [name] [name]   DOB:     1956/01/30 MRN:     [id number] Weight:  Appointment Date: Register at: Appointment Time: Referring MD:  _X__Exercise Stress  __Adenosine   __Dobutamine  __Lexiscan  __Persantine   __Thallium  Urgency: ____1 (next day)   ____2 (one week)    ____3 (PRN)  Patient will receive Follow Up call with results: Patient needs follow-up appointment:  Instructions regarding medication:  How to prepare for your stress test: 1. DO NOT eat or drink after midnight th night before test. This includes no caffeine (coffee, tea, sodas, chocolate) if you were instructed to take your medications, drink water with it. 2. DO NOT use any tobacco products for at leaset 8 hours prior to arrival. 3. DO NOT wear dresses or any clothing that may have metal clasps or buttons. 4. Wear short sleeve shirts, loose clothing, and comfortalbe walking shoes. 5. DO NOT use lotions, oils or powder on your chest before the test. 6. The test will take approximately 3-4 hours from the time you arrive until completion. 7. To register the day of the test, go to the Short Stay entrance at [facility]. 8. If you must cancel your test, call [phone] as soon as you are aware.  After you arrive for test:   When you arrive at [facility], you will go to Short Stay to be registered. They will then send you to Radiology to check in. The Nuclear Medicine Tech will get you and start an IV in your arm or hand. A small amount of a radioactive tracer will then be injected into your IV. This tracer will then have to circulate for 30-45 minutes. During this time you will wait in the waiting room and you will be able to drink something without caffeine. A series of pictures will be taken  of your heart follwoing this waiting period. After the 1st set of pictures you will go to the stress lab to get ready for your stress test. During the stress test, another small amount of a radioactive tracer will be injected through your IV. When the stress test is complete, there is a short rest period while your heart rate and blood pressure will be monitored. When this monitoring period is complete you will have another set of pictrues taken. (The same as the 1st set of pictures). These pictures are taken between 15 minutes and 1 hour after the stress test. The time depends on the type of stress test you had. Your doctor will inform you of your test results within 7 days after test.    The possibilities of certain changes are possible during the test. They include abnormal blood pressure and disorders of the heart. Side effects of persantine or adenosine can include flushing, chest pain, shortness of breath, stomach tightness, headache and light-headedness. These side effects usually do not last long and are self-resolving. Every effort will be made to keep you comfortable and to minimize complications by obtaining a medical history and by close observation during the test. Emergency equipment, medications, and trained personnel are available to deal with any unusual situation which may arise.  Please notify office at least 48 hours in advance if you are unable to keep this  appt.

## 2010-07-06 NOTE — Assessment & Plan Note (Signed)
Summary: RECTAL BLEEDING/LAW   Visit Type:  Initial Consult Referring Provider:  Neijstrom Primary Care Provider:  Lubertha South  CC:  rectal bleeding.  History of Present Illness: [name] [name] is a pleasant 55 year old male who presents today at the request of Dr. Mariel Sleet secondary to rectal bleeding. He has a hx significant for metastatic prostate ca stage IV, s/p radical prostatectomy by Dr. Margo Aye on 02/28/06. He reports that approximately 2 mos ago was the first incidence of noting blood in stool, bright red. He has a BM daily, soft, formed. Denies constipation. Denies abdominal pain or nausea. Continues to have intermittent brbpr. Also reports + esophageal dysphagia with tough textures X 4-5 months. Hx of GERD at least 3 years, started Ranitidine at that time and reports resolution of reflux. No prior hx of EGD, last TCS here per pt report at least 5 years ago? Chart not available during office visit for report. No loss of weight or appetite. Recent labs 03/27/10: H/H 12.7/37.2.,   Current Medications (verified): 1)  Alprazolam 0.5 Mg Tabs (Alprazolam) .... Take 1 Tablet By Mouth Three Times A Day 2)  Ranitidine Hcl 300 Mg Tabs (Ranitidine Hcl) .... Take 1 Tablet By Mouth Two Times A Day 3)  Spray For Psoriasis 4)  Cream For Psoriasis 5)  Percocet 10-325 Mg Tabs (Oxycodone-Acetaminophen) .... As Needed 6)  Oxycontin 20 Mg Xr12h-Tab (Oxycodone Hcl) .... One Tablet Every 12 Hours 7)  Gabapentin 300 Mg Caps (Gabapentin) .... Take Two Tablets At Bedtime 8)  Prolia 60 Mg/ml Soln (Denosumab) .... As Directed 9)  Lupron Depot 3.75 Mg Kit (Leuprolide Acetate) .... As Directed  Allergies (verified): 1)  ! * Zometa  Past History:  Past Medical History: Prostate ca osteoporosis DDD Anxiety Depression pamidronate-induced skin toxicity COPD (quit smoking 2 years ago) Psoriasis GERD  Past Surgical History: anterior cervical fusion posterior cervical fusion left foot  radical  prostatectomy  Family History: Father: deceased, lung ca Mother: living, alzheimers No FH of Colon Cancer:  Social History: Patient is a former smoker. quit 2 years ago hx of smoking X 15 years Alcohol Use - no Illicit Drug Use - no Occupation: part-time small jobs  Smoking Status:  quit Drug Use:  no  Review of Systems General:  Denies fever, chills, and anorexia. Eyes:  Denies blurring, diplopia, and discharge. ENT:  Denies sore throat, hoarseness, and difficulty swallowing. CV:  Denies chest pains, angina, syncope, and peripheral edema. Resp:  Denies dyspnea at rest, cough, and wheezing. GI:  Complains of difficulty swallowing and bloody BM's; denies pain on swallowing, nausea, indigestion/heartburn, abdominal pain, and constipation. GU:  Denies urinary burning, blood in urine, and urinary frequency. MS:  Denies joint pain / LOM, joint swelling, and joint deformity. Derm:  Denies rash, itching, and dry skin. Neuro:  Denies weakness, syncope, and frequent headaches. Psych:  Denies depression and anxiety. Endo:  Denies cold intolerance and heat intolerance.  Vital Signs:  Patient profile:   55 year old male Height:      69 inches Weight:      162.50 pounds BMI:     24.08 Temp:     97.4 degrees F oral Pulse rate:   68 / minute BP sitting:   120 / 88  (left arm) Cuff size:   regular  Vitals Entered By: Cloria Spring LPN (April 11, 2010 8:55 AM)  Physical Exam  General:  Well developed, well nourished, no acute distress. Head:  Normocephalic and atraumatic. Mouth:  No deformity  or lesions,dentures.  Lungs:  Clear throughout to auscultation. Heart:  Regular rate and rhythm; no murmurs, rubs,  or bruits. Abdomen:  normal bowel sounds, without guarding, without rebound, no distesion, no tenderness, no masses, and no hepatomegally or splenomegaly.   Msk:  Symmetrical with no gross deformities. Normal posture. Pulses:  Normal pulses noted. Extremities:  No clubbing,  cyanosis, edema or deformities noted. Neurologic:  Alert and  oriented x4;  grossly normal neurologically. Psych:  Alert and cooperative. Normal mood and affect.  Impression & Recommendations:  Problem # 1:  RECTAL BLEEDING (ICD-37.65)  55 year old male with brbpr X 2 mos, no abdominal pain, no N/V, no loss of appetite or weight. +BM daily. Recent labs 03/27/10: H/H 12.7/37.2., Last TCS approximately 5 years ago, report not currently available. Reportedly at this office. The risks and benefits have been discussed with the patient; he agrees to proceed and gave verbal consent.   TCS in OR secondary to polypharmacy with Dr. Darrick Penna.   Orders: Consultation Level III (98119)  Problem # 2:  DYSPHAGIA UNSPECIFIED (ICD-787.20)  +dysphagia x 4-5 months, hx of GERD for at least past 3 years, controlled on Ranitidine. diff dx include esophageal web, stricture, ring. Risks and benefits have been discussed and he desires to proceed.  EGD/ED in OR secondary to polypharmacy (along with TCS) Continue Ranitidine as reflux symptoms controlled.   Orders: Consultation Level III (14782)  Problem # 3:  GERD (ICD-530.81)  See #2.   Orders: Consultation Level III [phone])  Patient Instructions: 1)  Continue Ranitidine 2)  EGD/ED with TCS with Dr. Darrick Penna in OR secondary to polypharmacy 3)  Review results of prior TCS (reportedly done here 5+ years ago)  Appended Document: RECTAL BLEEDING/LAW last TCS December 21, 2005 with Dr. Darrick Penna: occasional sigmoid diverticulosis, no polyps, masses, inflammatory changes, normal rectum.

## 2010-07-06 NOTE — Letter (Signed)
Summary: Recall Radiology  [facility]  [address]   [place], [place] [address]   Phone: [phone]  Fax: [phone]    May 23, 2010  HOSIE SHARMAN [address] [place], [place]  [address] 02/12/1956   Dear Mr. [facility],   Our office needs to get you scheduled for your Colonoscopy/Upper Endoscopy. Please give our office a call to schedule this.  You may call the office at your convenience at [phone].  Please ask for the Referral Coordinator to make arrangements for this to be scheduled.  You may have to leave a message on our voice mail.  We will return your call.  If for any reason you do not wish to schedule this, please advise the office.  Please do not neglect your health.   Thank you,    Ave Filter  [facility] Ph: [phone]   Fax: [phone]

## 2010-07-06 NOTE — Letter (Signed)
Summary: tcs/egd with ed order  tcs/egd with ed order   Imported By: Ave Filter 05/22/2010 16:01:37  _____________________________________________________________________  External Attachment:    Type:   Image     Comment:   External Document

## 2010-07-06 NOTE — Assessment & Plan Note (Signed)
Summary: ***NP6 SYNCOPE, CHANGES IN BP, DEHYDRATION   Visit Type:  Initial Consult Referring Provider:  Dr. Glenford Peers Primary Provider:  Dr. Simone Curia   History of Present Illness: 55 year old male referred for cardiology consultation. Records indicate that he was seen recently in the emergency department with chest pain and dyspnea, also anxiety. He states that he had undergone a prep and was n.p.o. for an EGD and colonoscopy. He states that he began experiencing significant chest discomfort approximately 30 minutes after taking some medicines in the evening, specifically alprazolam, Zantac, and gabapentin. Symptoms reportedly continued into the next morning when he was sent to the emergency room.  Recent labs from 5 January showed WBCs 6.4, hemoglobin 11.7, platelets 260, d-dimer 0.39, sodium 141, potassium 3.6, BUN 6, creatinine 1.0, troponin I less than 0.05, CK-MB 1.8.  He reports a one-year history of intermittent chest "pressure" either on the left or right side, usually noted in the evening, sometimes waking him up at night, and present on the side on which he sleeps. Denies any clear reproducible exertional component to this. He also feels a "muscle cramp" in his midsternal area, very sporadic as well, noted over the last 3 months.  He reports a family history of cardiovascular disease stating that his father had cardiac disease diagnosed in his 36s, and that he more recently had a brother die at age 20 of a sudden MI.  He reports that he is metastatic prostate cancer has been stable over the past last few years, essentially with a PSA of zero, and close followup by Dr. Mariel Sleet. I confirmed this with Dr. Mariel Sleet by phone.  The also reports "easy bleeding" and has not been on antiplatelet drugs long-term. In addition to having dysphasia with plans for EGD, he also has had recent hematochezia with plans for colonoscopy.    Preventive Screening-Counseling &  Management  Alcohol-Tobacco     Smoking Status: quit  Current Medications (verified): 1)  Alprazolam 0.5 Mg Tabs (Alprazolam) .... Take 1 Tablet By Mouth Three Times A Day 2)  Ranitidine Hcl 300 Mg Tabs (Ranitidine Hcl) .... Take 1 Tablet By Mouth Two Times A Day 3)  Spray For Psoriasis 4)  Cream For Psoriasis 5)  Percocet 10-325 Mg Tabs (Oxycodone-Acetaminophen) .... As Needed 6)  Oxycontin 20 Mg Xr12h-Tab (Oxycodone Hcl) .... One Tablet Every 12 Hours 7)  Gabapentin 300 Mg Caps (Gabapentin) .... Take Two Tablets At Bedtime 8)  Prolia 60 Mg/ml Soln (Denosumab) .... As Directed 9)  Lupron Depot 3.75 Mg Kit (Leuprolide Acetate) .... As Directed  Allergies (verified): 1)  ! * Zometa  Comments:  Nurse/Medical Assistant: patient didint bring meds or list reviewed from what [facility]  Past History:  Family History: Last updated: 07/07/10 Father: lung cancer, died age 71 with CAD Mother: Alzheimer's dementia Siblings: Brother died age 55 with MI Also maternal uncles with CAD  Social History: Last updated: 2010/07/07 Alcohol Use - no Illicit Drug Use - no Occupation: part-time small jobs  Tobacco Use - Former.   Past Medical History: Metastatic prostate cancer stage IV s/p radical prostatectomy 9/07 Sigmoid diverticulosis DDD Anxiety Depression Pamidronate - induced skin toxicity Zometa - induced toxicity COPD Psoriasis GERD - history of dysphasia Horseshoe kidney - single kidney  Past Surgical History: Anterior cervical fusion Posterior cervical fusion Left foot surgery Radical prostatectomy Appendectomy  Family History: Father: lung cancer, died age 57 with CAD Mother: Alzheimer's dementia Siblings: Brother died age 81 with MI Also maternal  uncles with CAD  Social History: Alcohol Use - no Illicit Drug Use - no Occupation: part-time small jobs  Tobacco Use - Former.   Review of Systems       The patient complains of chest  pain and hematochezia.  The patient denies anorexia, fever, weight gain, syncope, dyspnea on exertion, peripheral edema, prolonged cough, hemoptysis, and melena.         Otherwise negative except as outlined above.  Vital Signs:  Patient profile:   55 year old male Weight:      164 pounds BMI:     24.31 Pulse rate:   67 / minute BP sitting:   146 / 94  (left arm)  Vitals Entered By: Dreama Saa, CNA (June 21, 2010 2:48 PM)  Physical Exam  Additional Exam:  Chronically ill-appearing male in no acute distress. HEENT: Conjunctiva and lids normal, oropharynx with poor dentition. Neck: Supple, no elevated JVP or loud bruits, no thyromegaly. Lungs: Diminished breath sounds, nonlabored, no wheezing. Excellent cardiac: Regular rate and rhythm, soft systolic murmur at the base, but her second heart sound, no S3. Abdomen: Soft, nontender, bowel sounds present. Skin: Warm and dry, somewhat friable and thin, scattered ecchymoses particularly in the forearms. Musculoskeletal: No kyphosis. Extremities: No pitting edema, distal pulses one plus. Neuropsychiatric: Alert and oriented x3, somewhat anxious, affect appropriate.   CXR  Procedure date:  06/08/2010  Findings:      Findings:   Normal heart size, mediastinal contours, and pulmonary vascularity.   Minimal chronic bronchitic changes.   Lungs otherwise clear.   No pleural effusion or pneumothorax.   Prior cervical spine fusion.    IMPRESSION:   Minimal chronic bronchitic changes.   No acute abnormalities.   EKG  Procedure date:  06/21/2010  Findings:      Sinus bradycardia at 58 beats per minutes, sinus arrhythmia, no significant ST segment changes.  Impression & Recommendations:  Problem # 1:  CHEST PAIN (ICD-786.50)  Fairly long-term history of chest pain, more notable over the last few months, and recently culminating in evaluation in the emergency department in association with shortness of breath and anxiety.  Characterized by both typical and atypical features, and noted in the setting of prior history of tobacco use, elevated blood pressure today, and some family history of premature cardiovascular disease. He denies any prior ischemic testing. ECG today is reassuring. We discussed the matter today, and reviewed diagnostic options, including both noninvasive and invasive techniques. Frankly at this point I am concerned about his bleeding risk, and ability to take antiplatelet therapy were we to find obstructive CAD amenable to revascularization. I spoke with Dr. Mariel Sleet about this, and he reported no major reservation about the patient using either aspirin or Plavix. Having said that, he has still not undergone formal gastroenterology evaluation with history of reported hematochezia and dysphasia. It would be important to exclude a bleeding source prior to initiating long-term antiplatelet therapy. In view of these issues and complexity, plan will be to proceed with noninvasive cardiac testing first, specifically an exercise Myoview and echocardiogram, to review ischemic burden and overall risk. I will then bring him back in the office and discuss things further, and formulate a plan going forward.  Orders: Nuclear Stress Test (Nuc Stress Test) 2-D Echocardiogram (2D Echo)  Problem # 2:  R/O HYPERLIPIDEMIA (ICD-272.4)  Uncertain lipid status. Plan followup fasting lipid profile to assess.  Orders: T-Lipid Profile [phone]) T-Hepatic Function [phone])  Problem # 3:  PROSTATE CANCER (ICD-185)  Stage IV metastatic prostate cancer, reportedly well controlled at this time with PSA essentially at zero. Followed by Dr. Mariel Sleet.  Patient Instructions: 1)  Your physician recommends that you schedule a follow-up appointment in: 2 to 3 weeks 2)  Your physician recommends that you return for lab work in: This week 3)  Your physician recommends that you continue on your current medications as  directed. Please refer to the Current Medication list given to you today. 4)  Your physician has requested that you have an echocardiogram.  Echocardiography is a painless test that uses sound waves to create images of your heart. It provides your doctor with information about the size and shape of your heart and how well your heart's chambers and valves are working.  This procedure takes approximately one hour. There are no restrictions for this procedure. 5)  Your physician has requested that you have an exercise stress myoview.  For further information please visit [email or web address]  Please follow instruction sheet, as given.

## 2010-07-06 NOTE — Procedures (Signed)
Summary: Colonoscopy  Patient: [name] [name] Note: All result statuses are Final unless otherwise noted.  Tests: (1) Colonoscopy (COL)   COL Colonoscopy           DONE     [facility]     [address]     [place], [place]  [address]           COLONOSCOPY PROCEDURE REPORT     PATIENT:  [name], [name]  MR#:  [id number]     BIRTHDATE:  1956/03/24, 54 yrs. old  GENDER:  male     ENDOSCOPIST:  Rachael Fee, MD     REF. BY:  Arturo Morton. Riley Kill, M.D.     PROCEDURE DATE:  06/30/2010     PROCEDURE:  Colonoscopy with snare polypectomy     ASA CLASS:  Class II     INDICATIONS:  minor, intermittent rectal bleeding     MEDICATIONS:   Fentanyl 100 mcg IV, Versed 10 mg IV, Benadryl 25     mg IV     DESCRIPTION OF PROCEDURE:   After the risks benefits and     alternatives of the procedure were thoroughly explained, informed     consent was obtained.  Digital rectal exam was performed and     revealed no rectal masses.   The [id number] ([id number]) endoscope was     introduced through the anus and advanced to the cecum, which was     identified by both the appendix and ileocecal valve, without     limitations.  The quality of the prep was good, using MoviPrep.     The instrument was then slowly withdrawn as the colon was fully     examined.     <<PROCEDUREIMAGES>>     FINDINGS:  A sessile polyp was found in the distal transverse     colon. This was 4mm across, removed with cold snare and sent to     pathology (jar 1) (see image004).  Internal hemorrhoids were     found. These were small, not thrombosed.  This was otherwise a     normal examination of the colon (see image006, image001, and     image002).   Retroflexed views in the rectum revealed no     abnormalities.    The scope was then withdrawn from the patient     and the procedure completed.     COMPLICATIONS:  None           ENDOSCOPIC IMPRESSION:     1) Small essile polyp in the distal transverse colon, removed   and sent to pathology     2) Internal hemorrhoids     3) Otherwise normal examination           RECOMMENDATIONS:     1) If the polyp(s) removed today are proven to be adenomatous     (pre-cancerous) polyps, you will need a repeat colonoscopy in 5     years. Otherwise you should continue to follow colorectal cancer     screening guidelines for "routine risk" patients with colonoscopy     in 10 years.     2) You will receive a letter within 1-2 weeks with the results     of your biopsy as well as final recommendations. Please call my     office if you have not received a letter after 3 weeks.     3) EGD now.  ______________________________     Rachael Fee, MD           n.     eSIGNED:   Rachael Fee at 06/30/2010 10:41 AM           Doylene Bode, [id number]  Note: An exclamation mark (!) indicates a result that was not dispersed into the flowsheet. Document Creation Date: 06/30/2010 10:42 AM _______________________________________________________________________  (1) Order result status: Final Collection or observation date-time: 06/30/2010 10:37 Requested date-time:  Receipt date-time:  Reported date-time:  Referring Physician:   Ordering Physician: Rob Bunting [phone]) Specimen Source:  Source: Launa Grill Order Number: [id number] Lab site:

## 2010-07-06 NOTE — Letter (Signed)
Summary: TCS/EGD/ED ORDER  TCS/EGD/ED ORDER   Imported By: Ave Filter 05/25/2010 09:39:31  _____________________________________________________________________  External Attachment:    Type:   Image     Comment:   External Document  Appended Document: TCS/EGD/ED ORDER Pt wants to f/u with his pcp first. He will call back after that ov and reschedule his procedure.

## 2010-07-06 NOTE — Letter (Signed)
Summary: records from [facility]  records from [facility]   Imported By: Faythe Ghee 06/22/2010 11:01:09  _____________________________________________________________________  External Attachment:    Type:   Image     Comment:   External Document

## 2010-07-06 NOTE — Procedures (Signed)
Summary: Upper Endoscopy  Patient: [name] Note: All result statuses are Final unless otherwise noted.  Tests: (1) Upper Endoscopy (EGD)   EGD Upper Endoscopy       DONE     [facility]     [address]     [place], [place]  [address]           ENDOSCOPY PROCEDURE REPORT           PATIENT:  [name] [name], [name] [name]  MR#:  [id number]     BIRTHDATE:  04/25/1956, 54 yrs. old  GENDER:  male           ENDOSCOPIST:  Rachael Fee, MD     PROCEDURE DATE:  06/30/2010     PROCEDURE:  EGD with biopsy, [id number]     ASA CLASS:  Class II     INDICATIONS:  intermittent dysphagia, minor rectal bleeding           MEDICATIONS:  There was residual sedation effect present from     prior procedure., Versed 2 mg IV     TOPICAL ANESTHETIC:  Cetacaine Spray           DESCRIPTION OF PROCEDURE:   After the risks benefits and     alternatives of the procedure were thoroughly explained, informed     consent was obtained.  The [id number] ([id number]) endoscope was     introduced through the mouth and advanced to the second portion of     the duodenum, without limitations.  The instrument was slowly     withdrawn as the mucosa was fully examined.           Pictures were taken, however not saved due to computer problem           There was mild, non specific gastritis. Biopsies taken to check     for H. pylori (pathology jar 1).  There was mild edema at GE     junction, likely GERD related.  Dilation was not performed.     Otherwise the examination was normal.    Retroflexed views revealed     no abnormalities.    The scope was then withdrawn from the patient     and the procedure completed.           COMPLICATIONS:  None           ENDOSCOPIC IMPRESSION:     1) Mild gastritis; biopsied to check for H. pylori     2) GERD related edema at GE junction     3) Otherwise normal examination           RECOMMENDATIONS:     Will change from H2 blocker to PPI given his chronic GERD and  intermittent dysphagia (likely related to GERD).     If biopsies show H. pylori, he will be started on appropriate     antibtiotics.     Ok to use blood thinning meds as needed to treat cardiac     disease.           ______________________________     Rachael Fee, MD           n.     eSIGNED:   Rachael Fee at 06/30/2010 10:54 AM           Doylene Bode, [id number]  Note: An exclamation mark (!) indicates a result that was not dispersed into the flowsheet. Document Creation Date: 06/30/2010 10:54 AM  _______________________________________________________________________  (1) Order result status: Final Collection or observation date-time: 06/30/2010 10:48 Requested date-time:  Receipt date-time:  Reported date-time:  Referring Physician:   Ordering Physician: Rob Bunting [phone]) Specimen Source:  Source: Launa Grill Order Number: [id number] Lab site:

## 2010-07-06 NOTE — Assessment & Plan Note (Signed)
Summary: [name] [name] needs to be seen to set up procedure/cm   Visit Type:  Follow-up Visit Primary Care Provider:  Lubertha South  Chief Complaint:  To schedule procedures.  History of Present Illness: [name] [name] here to set up colonoscopy for hematochezia.  Seen by Gerrit Halls, NP in Nov 11, but car broke down just prior to TCS/EGD.  No further rectal bleeding.  c/o dysphagia w/ beef & some other solids stuck in upper esophagus.  Denies heartburn & indigestion.  Denies constipation or diarrhea.  Denies rectal bleeding or melena.  Denies wt loss, fever, or chills.  Current Problems (verified): 1)  Gerd  (ICD-530.81) 2)  Dysphagia Unspecified  (ICD-787.20) 3)  Rectal Bleeding  (ICD-569.3) 4)  Hx of Pulmonary Embolism  (ICD-415.19)  Current Medications (verified): 1)  Alprazolam 0.5 Mg Tabs (Alprazolam) .... Take 1 Tablet By Mouth Three Times A Day 2)  Ranitidine Hcl 300 Mg Tabs (Ranitidine Hcl) .... Take 1 Tablet By Mouth Two Times A Day 3)  Spray For Psoriasis 4)  Cream For Psoriasis 5)  Percocet 10-325 Mg Tabs (Oxycodone-Acetaminophen) .... As Needed 6)  Oxycontin 20 Mg Xr12h-Tab (Oxycodone Hcl) .... One Tablet Every 12 Hours 7)  Gabapentin 300 Mg Caps (Gabapentin) .... Take Two Tablets At Bedtime 8)  Prolia 60 Mg/ml Soln (Denosumab) .... As Directed 9)  Lupron Depot 3.75 Mg Kit (Leuprolide Acetate) .... As Directed  Allergies (verified): 1)  ! * Zometa  Past History:  Past Medical History: Prostate ca-metastatic prostate ca stage IV, s/p radical prostatectomy by Dr. Margo Aye on 02/28/06. last TCS 7/07 Dr Tomi Bamberger diverticulosis DDD Anxiety Depression pamidronate-induced skin toxicity COPD (quit smoking 2 years ago) Psoriasis GERD horseshoe kidney (1 kidney) hx PE  Past Surgical History: anterior cervical fusion posterior cervical fusion left foot  radical prostatectomy appendectomy age 50  Family History: Father: deceased, lung ca Mother: living, alzheimers No FH CRCA, liver or  chronic GI problems  Social History: Reviewed history from 04/11/2010 and no changes required. Patient is a former smoker. quit 2 years ago hx of smoking X 15 years Alcohol Use - no Illicit Drug Use - no Occupation: part-time small jobs   Review of Systems      See HPI General:  Denies fever, chills, sweats, anorexia, fatigue, weakness, malaise, weight loss, and sleep disorder. CV:  Denies chest pains, angina, palpitations, syncope, dyspnea on exertion, orthopnea, PND, peripheral edema, and claudication. Resp:  Denies dyspnea at rest, dyspnea with exercise, cough, sputum, wheezing, coughing up blood, and pleurisy. GI:  See HPI; Denies jaundice and fecal incontinence. GU:  Denies urinary burning, blood in urine, urinary frequency, urinary hesitancy, and nocturnal urination. MS:  Denies joint pain / LOM, joint swelling, joint stiffness, joint deformity, low back pain, muscle weakness, muscle cramps, muscle atrophy, leg pain at night, leg pain with exertion, and shoulder pain / LOM hand / wrist pain (CTS). Derm:  Denies rash, itching, dry skin, hives, moles, warts, and unhealing ulcers. Psych:  Denies depression, anxiety, memory loss, suicidal ideation, hallucinations, paranoia, phobia, and confusion. Heme:  Denies bruising, bleeding, and enlarged lymph nodes.  Vital Signs:  Patient profile:   55 year old male Height:      69 inches Weight:      168 pounds BMI:     24.90 Temp:     97.9 degrees F oral Pulse rate:   68 / minute BP sitting:   132 / 84  (left arm) Cuff size:   large  Vitals Entered By:  Cloria Spring LPN (May 22, 2010 3:24 PM)  Physical Exam  General:  Well developed, well nourished, no acute distress. Head:  Normocephalic and atraumatic. Eyes:  Sclera clear, no icterus. Ears:  Normal auditory acuity. Nose:  No deformity, discharge,  or lesions. Mouth:  No deformity or lesions,dentures.  Neck:  Supple; no masses or thyromegaly. Lungs:  Clear throughout to  auscultation. Heart:  Regular rate and rhythm; no murmurs, rubs,  or bruits. Abdomen:  normal bowel sounds, without guarding, without rebound, no distesion, no tenderness, no masses, and no hepatomegally or splenomegaly.   Msk:  Symmetrical with no gross deformities. Normal posture. Pulses:  Normal pulses noted. Extremities:  No clubbing, cyanosis, edema or deformities noted. Neurologic:  Alert and  oriented x4;  grossly normal neurologically. Skin:  Intact without significant lesions or rashes. Cervical Nodes:  No significant cervical adenopathy. Psych:  Alert and cooperative. Normal mood and affect.  Impression & Recommendations:  Problem # 1:  RECTAL BLEEDING (ICD-46.38) 55 year old male with hx hematochezia x 2 months here to reschedule colonoscopy.  Diagnostic colonoscopy & EGD with possible esophageal dilation to be performed by Dr. Suszanne Conners Rourk in the near future in Dr. Evelina Dun absence.  I have discussed risks and benefits which include, but are not limited to, bleeding, infection, perforation, or medication reaction.  The patient agrees with this plan and consent will be obtained.  Procedure scheduled in OR secondary to polypharmacy.   Orders: Est. Patient Level III ([address])  Problem # 2:  GERD (ICD-530.81) Stable on Ranitidine two times a day.  Problem # 3:  DYSPHAGIA UNSPECIFIED (ICD-787.20) Solid food dysphagia.  ? esophageal web, ring or stricture.

## 2010-07-07 NOTE — Procedures (Signed)
[name] [name], [name] [name]                ACCOUNT NO.:  [id number]  MEDICAL RECORD NO.:  [id number]          PATIENT TYPE:  INP  LOCATION:  6526                         FACILITY:  [facility]  PHYSICIAN:  Veverly Fells. Excell Seltzer, MD  DATE OF BIRTH:  09/23/1955  DATE OF PROCEDURE:  07/03/2010 DATE OF DISCHARGE:                           CARDIAC CATHETERIZATION   PROCEDURE: 1. Intravascular ultrasound of the left anterior descending. 2. Stenting of the left anterior descending.  SURGEON:  Veverly Fells. Excell Seltzer, MD  Hilarie Fredrickson INDICATIONS:  [name] [name] is a 55 year old gentleman who presented with progressive chest pain.  His symptoms were felt to be consistent with unstable angina and he underwent cardiac catheterization on June 28, 2010 by Dr. Riley Kill.  This demonstrated long segment moderately severe stenosis of the proximal and mid LAD.  The patient was evaluated for lower GI bleeding.  He was found to have internal hemorrhoids.  He had no other clear source of bleeding and his hemoglobin remained stable.  He was referred for PCI of the LAD.  I thought his lesion was indeterminate and should be evaluated with intravascular ultrasound.  There was a long segment of stenosis and I was concerned that it was significant.  DESCRIPTION OF PROCEDURE:  Risks and indications of the procedure were reviewed with the patient.  Informed consent was obtained.  The left wrist was prepped, draped, and anesthetized with 1% lidocaine.  Using modified Seldinger technique, a 6-French sheath was placed in the left radial artery.  A 6-French XB LAD 3.5 cm guide catheter was inserted. Angiomax was used for anticoagulation after therapeutic ACT was achieved.  A Cougar guidewire was advanced into the second diagonal branch beyond the area of concern.  Intracoronary nitroglycerin was administered.  An intravascular ultrasound probe was passed and a mechanical pullback was performed through the lesion.  The  distal reference vessel was large, approximately 3.5 mm in diameter.  There was a long segment of mixed plaque with portions of calcific plaque and other areas of soft plaque.  The minimal lumen area was 3.5 mm2 which was clearly significant with heavy plaque burden.  I decided to primarily stent the vessel using a 3.5 x 28 mm Promus stent.  The first stent was deployed at 14 atmospheres.  A 3.5 x 16 mm stent was then deployed in overlapping fashion over the proximal segment of disease. It was also deployed at 14 atmospheres.  Initially, there was a concern about a distal edge dissection and I passed a 3.0 x 12 mm Promus stent into the distal off the distal stent edge but after some time with positioning the area appeared less significant.  I elected not to deploy the stent and that stent was removed from the body without deployment. The stented segment was first postdilated with a 3.75 x 20 mm Duncan TREK balloon up to 16 atmospheres on 2 inflations.  Intravascular ultrasound was then performed and demonstrated an excellent result.  The transition of the distal edge of the stent was good and there was no evidence of dissection.  The stent was well deployed throughout with mild under- expansion in an  area of heavy calcification but that area had been postdilated aggressively.  There was good apposition throughout the entire segment.  Final angiography demonstrated good result with 0% residual stenosis.  The patient tolerated the procedure well.  The guide catheter and wire were removed.  FINAL ASSESSMENT:  Successful intravascular ultrasound guided percutaneous coronary intervention of the proximal and mid left anterior descending reducing 70% to 0% stenosis with overlapping drug-eluting stents.  There was TIMI-3 flow, both pre and post.  RECOMMENDATIONS:  The patient should continue on dual-antiplatelet therapy with aspirin and Plavix for 12 months.  The patient was loaded with Plavix  just at the completion of the procedure.  He received 600 mg, so he will be continued on Angiomax at full PCI dose for another 2 hours.     Veverly Fells. Excell Seltzer, MD     MDC/MEDQ  D:  07/03/2010  T:  07/04/2010  Job:  [id number]  cc:   Arturo Morton. Riley Kill, MD, [facility]  Electronically Signed by Tonny Bollman MD on 07/07/2010 02:03:51 PM

## 2010-07-07 NOTE — Letter (Signed)
Summary: TCS/EGDED ORDER  TCS/EGDED ORDER   Imported By: Ave Filter 04/11/2010 16:01:53  _____________________________________________________________________  External Attachment:    Type:   Image     Comment:   External Document

## 2010-07-12 ENCOUNTER — Ambulatory Visit (HOSPITAL_COMMUNITY): Payer: Medicare Other

## 2010-07-12 DIAGNOSIS — M1990 Unspecified osteoarthritis, unspecified site: Secondary | ICD-10-CM

## 2010-07-12 DIAGNOSIS — C61 Malignant neoplasm of prostate: Secondary | ICD-10-CM

## 2010-07-12 NOTE — Letter (Signed)
Summary: Results Letter  [facility]  [address] [place], [place] [address]   Phone: [phone]  Fax: [phone]        July 04, 2010 MRN: [id number]    JOHNTAVIUS SHEPARD [address] [place], [place]  [address]    Dear Mr. Dimmitt,   At least one of the polyps removed during your recent procedure was proven to be adenomatous.  These are pre-cancerous polyps that may have grown into cancers if they had not been removed.  Based on current nationally recognized surveillance guidelines, I recommend that you have a repeat colonoscopy in 5 years.  The biopsies from your stomach showed no infection or cancer.  We will therefore put your information in our reminder system and will contact you in 5 years to schedule a repeat colonoscopy.  Please call if you have any questions or concerns.       Sincerely,  Rachael Fee MD  This letter has been electronically signed by your physician.  Appended Document: Results Letter letter mailed

## 2010-07-13 ENCOUNTER — Ambulatory Visit (INDEPENDENT_AMBULATORY_CARE_PROVIDER_SITE_OTHER): Payer: Medicare Other | Admitting: Cardiology

## 2010-07-13 ENCOUNTER — Encounter: Payer: Self-pay | Admitting: Cardiology

## 2010-07-13 DIAGNOSIS — I2510 Atherosclerotic heart disease of native coronary artery without angina pectoris: Secondary | ICD-10-CM

## 2010-07-13 DIAGNOSIS — E782 Mixed hyperlipidemia: Secondary | ICD-10-CM

## 2010-07-20 NOTE — Letter (Signed)
Summary: [facility] [employer]  [address] [place] [address]   Phone: [phone]  Fax: [phone]     July 13, 2010 MRN: [id number]   JP EASTHAM [address] [place], [place]  [address]      YOUR LAB WORK IS DUE  August 21, 2010 _________________________________________  Please go to [facility], located across the street from [facility] on the second floor.  Hours are Monday - Friday 7am until 7:30pm         Saturday 8am until 12noon    _X_  DO NOT EAT OR DRINK AFTER MIDNIGHT EVENING PRIOR TO LABWORK  __ YOUR LABWORK IS NOT FASTING --YOU MAY EAT PRIOR TO LABWORK

## 2010-07-20 NOTE — Assessment & Plan Note (Signed)
Summary: 3wk followup/rm   Visit Type:  Follow-up Referring Provider:  Dr. Glenford Peers Primary Provider:  Dr. Simone Curia   History of Present Illness: 55 year old male presents for followup. He is status post recent hospitalization with unstable angina and diagnosis of CAD resulting in placement of overlapping drug-eluting stents in the proximal and mid left anterior descending. I actually saw him in consultation during this time, in the setting of a canceled stress test with progressive symptoms. He did undergo formal gastroenterology evaluation prior to stent placement with findings of gastritis by EGD as well as tubular adenoma and internal hemorrhoids by colonoscopy. He was placed on a proton pump inhibitor, and it was felt reasonable for him to be able to take dual antiplatelet therapy for a year.  He reports improvement in chest pain symptoms, although still has some that may be more related to reflux at times. He reports better energy. No obvious bleeding problems indicated. He's been taking Plavix but not aspirin. I reviewed his medications today.  We reviewed the results of his cardiac catheterization, and the importance of dual antiplatelet therapy in light of his recent drug-eluting stents.  Current Medications (verified): 1)  Alprazolam 0.5 Mg Tabs (Alprazolam) .... Take 1 Tablet By Mouth Three Times A Day 2)  Spray For Psoriasis 3)  Cream For Psoriasis 4)  Oxycontin 20 Mg Xr12h-Tab (Oxycodone Hcl) .... One Tablet Every 12 Hours 5)  Gabapentin 300 Mg Caps (Gabapentin) .... Take Two Tablets At Bedtime 6)  Prolia 60 Mg/ml Soln (Denosumab) .... As Directed 7)  Lupron Depot 3.75 Mg Kit (Leuprolide Acetate) .... As Directed 8)  Plavix 75 Mg Tabs (Clopidogrel Bisulfate) .... Take 1 Tab Daily 9)  Pravachol 40 Mg Tabs (Pravastatin Sodium) .... Take 1 Tab Daily 10)  Metoprolol Succinate 25 Mg Xr24h-Tab (Metoprolol Succinate) .... Take 1 Tab Daily 11)  Protonix 40 Mg Tbec  (Pantoprazole Sodium) .... Take 1 Tab Daily 12)  Nitrostat 0.4 Mg Subl (Nitroglycerin) .... Take As Directed For Chest Pain  Allergies (verified): 1)  ! * Zometa  Comments:  Nurse/Medical Assistant: patient brought meds he uses laynes   Past History:  Social History: Last updated: 06/21/2010 Alcohol Use - no Illicit Drug Use - no Occupation: part-time small jobs  Tobacco Use - Former.   Past Medical History: Metastatic prostate cancer stage IV s/p radical prostatectomy 9/07 Sigmoid diverticulosis DDD Anxiety Depression Pamidronate - induced skin toxicity Zometa - induced toxicity COPD Psoriasis GERD, gastritis - EGD 1/12 Tubular adenoma, internal hemorrhoids - colonoscopy 1/12 Horseshoe kidney - single kidney CAD - DES to LAD 1/12  Review of Systems  The patient denies anorexia, fever, syncope, dyspnea on exertion, peripheral edema, prolonged cough, hemoptysis, melena, and hematochezia.         Otherwise reviewed and negative except as outlined.  Vital Signs:  Patient profile:   55 year old male Weight:      158 pounds BMI:     23.42 Pulse rate:   64 / minute BP sitting:   94 / 68  (left arm)  Vitals Entered By: Dreama Saa, CNA (July 13, 2010 8:49 AM)  Physical Exam  Additional Exam:  Chronically ill-appearing male in no acute distress. HEENT: Conjunctiva and lids normal, oropharynx with poor dentition. Neck: Supple, no elevated JVP or loud bruits, no thyromegaly. Lungs: Diminished breath sounds, nonlabored, no wheezing.  Cardiac: Regular rate and rhythm, soft systolic murmur at the base, but her second heart sound, no S3. Abdomen: Soft, nontender,  bowel sounds present. Skin: Warm and dry, scattered ecchymoses particularly in the forearms. Musculoskeletal: No kyphosis. Extremities: No pitting edema, distal pulses one plus. Neuropsychiatric: Alert and oriented x3, affect appropriate.   Cardiac Cath  Procedure date:  06/28/2010  Findings:        ANGIOGRAPHIC DATA:   1. Ventriculography in the RAO projection revealed hyperdynamic       vigorous LV function.  Because of ventricular ectopy, an ejection       fraction would not be accurately calculated but clearly there are       no definite wall motion abnormalities.   2. The left main is free of critical disease.   3. The LAD demonstrates a long area of segmental disease in the       proximal portion, at its most severe point, it would measure       between 70 and 80% luminal reduction.  The vessel is segmentally       diseased beyond that but less severely diseased with about 50%       narrowing up to just proximal to the takeoff of the diagonal.  Just       after the diagonal was about 40% eccentric plaquing.  The distal       LAD and the diagonal were large-caliber vessels.   4. The ramus intermedius is a smallish artery with about 80% proximal       narrowing.   5. The AV circumflex has mild luminal irregularities and moderately       large marginal system and is free of critical disease.   6. The right coronary artery is a fairly large-caliber vessel.  The       vessel has an anterior takeoff.  We used a left bypass to engage       the vessel.  There is segmental 40-50% narrowing in the proximal       segment but the minimum lumen diameter is well in excess of 2.5 mm       and does not appear to be flow limiting.  It is in the junction       between the proximal and mid vessel and is graded at 40%.  The       distal vessel is a large-caliber vessel supplying a huge area of       inferior myocardium with a PDA and two very large posterolateral       branches.  Impression & Recommendations:  Problem # 1:  CORONARY ATHEROSCLEROSIS NATIVE CORONARY ARTERY (ICD-414.01)  Symptomatically improved, recently diagnosed at catheterization, status post drug-eluting stent placement to the left anterior descending. I underscored the importance of dual antiplatelet therapy. He will begin  taking aspirin as well as Plavix. Otherwise we will continue clinical observation, followup in 6 weeks.  His updated medication list for this problem includes:    Plavix 75 Mg Tabs (Clopidogrel bisulfate) .Marland Kitchen... Take 1 tab daily    Metoprolol Succinate 25 Mg Xr24h-tab (Metoprolol succinate) .Marland Kitchen... Take 1 tab daily    Nitrostat 0.4 Mg Subl (Nitroglycerin) .Marland Kitchen... Take as directed for chest pain  Problem # 2:  R/O HYPERLIPIDEMIA (ICD-272.4)  Now on statin therapy, followup fasting lipid profile and liver function tests for his next visit.  His updated medication list for this problem includes:    Pravachol 40 Mg Tabs (Pravastatin sodium) .Marland Kitchen... Take 1 tab daily  Future Orders: T-Lipid Profile ([id number]) ... 08/21/2010 T-Hepatic Function [id number]) ... 08/21/2010  Problem #  3:  GERD (ICD-530.81)  Associated gastritis by recent EGD. Now on proton pump inhibitor.  The following medications were removed from the medication list:    Ranitidine Hcl 300 Mg Tabs (Ranitidine hcl) .Marland Kitchen... Take 1 tablet by mouth two times a day His updated medication list for this problem includes:    Protonix 40 Mg Tbec (Pantoprazole sodium) .Marland Kitchen... Take 1 tab daily  Problem # 4:  RECTAL BLEEDING (ICD-569.3)  Now status post assessment with colonoscopy, findings of tubular adenoma as well as internal hemorrhoids.  Patient Instructions: 1)  Your physician recommends that you schedule a follow-up appointment in: 6 weeks 2)  Your physician recommends that you return for lab work in: 6 weeks, just before next office visit 3)  Your physician recommends that you continue on your current medications as directed. Please refer to the Current Medication list given to you today.

## 2010-07-26 NOTE — Procedures (Signed)
NAMEGERHARD, RAPPAPORT NO.:  [id number]  MEDICAL RECORD NO.:  [id number]          PATIENT TYPE:  INP  LOCATION:  2912                         FACILITY:  [facility]  PHYSICIAN:  Arturo Morton. Riley Kill, MD, FACCDATE OF BIRTH:  1956/02/13  DATE OF PROCEDURE:  06/28/2010 DATE OF DISCHARGE:                           CARDIAC CATHETERIZATION   INDICATIONS:  [name] is a very nice 55 year old gentleman transferred down for further evaluation.  He has had intermittent chest pain.  Importantly, the patient also has had blood in his stool for several months.  Colonoscopy has been planned that he has had problems with this.  He has had some recent chest pain which has gotten somewhat worse.  They were unable to complete an outpatient Myoview today, and he was seen by Dr. Diona Browner and subsequently referred for further evaluation.  Importantly, the patient does have a history of prostate cancer and has been on Lupron.  Risks, benefits, and alternatives were discussed with the patient.  He consented to proceed.  PROCEDURES: 1. Left heart catheterization. 2. Selective coronary arteriography. 3. Selective left ventriculography.  DESCRIPTION OF THE PROCEDURE:  We initially attempted to approach from the right radial artery; however, the patient has had extensive surgery in the wrist as a child, and we were unable to cannulate the radial artery with reasonable feeding of the wire.  As a result, attention was shifted to the femoral artery, a 5-French sheath was placed.  Views of the left and right coronary arteries were obtained in multiple angiographic projections.  Central aortic and left ventricular pressures were measured with pigtail.  Ventriculography was performed in the RAO projection.  I reviewed the films with the patient and subsequently his two sisters.  There were no major complications.  HEMODYNAMIC DATA: 1. The central aortic pressure was 100/75, mean 87. 2. LV  pressure 104/11. 3. No gradient or pullback across the aortic valve.  ANGIOGRAPHIC DATA: 1. Ventriculography in the RAO projection revealed hyperdynamic     vigorous LV function.  Because of ventricular ectopy, an ejection     fraction would not be accurately calculated but clearly there are     no definite wall motion abnormalities. 2. The left main is free of critical disease. 3. The LAD demonstrates a long area of segmental disease in the     proximal portion, at its most severe point, it would measure     between 70 and 80% luminal reduction.  The vessel is segmentally     diseased beyond that but less severely diseased with about 50%     narrowing up to just proximal to the takeoff of the diagonal.  Just     after the diagonal was about 40% eccentric plaquing.  The distal     LAD and the diagonal were large-caliber vessels. 4. The ramus intermedius is a smallish artery with about 80% proximal     narrowing. 5. The AV circumflex has mild luminal irregularities and moderately     large marginal system and is free of critical disease. 6. The right coronary artery is a fairly large-caliber vessel.  The  vessel has an anterior takeoff.  We used a left bypass to engage     the vessel.  There is segmental 40-50% narrowing in the proximal     segment but the minimum lumen diameter is well in excess of 2.5 mm     and does not appear to be flow limiting.  It is in the junction     between the proximal and mid vessel and is graded at 40%.  The     distal vessel is a large-caliber vessel supplying a huge area of     inferior myocardium with a PDA and two very large posterolateral     branches.  CONCLUSIONS: 1. Vigorous global systolic function. 2. Scattered coronary abnormalities involving the right coronary     artery with 40% proximal narrowing. 3. Moderately high-grade proximal mid left anterior descending artery     stenosis that does not appear to be critical but likely flow      limiting.  DISPOSITION:  Given the patient's history with chest pain and increasing frequency, we are concerned about this.  The disease is very segmental after the very tightest area and could be spot stented with a nondrug eluting or possibly stented with a drug eluting.  Alternatively medical therapy could be considered but the patient has had some increasing symptoms.  Notably, the patient has had blood in his stool for about 3 months, and they have been trying to do a colonoscopy and upper endo. We will get a GI consult.  I think he could safely undergo endoscopic studies prior to any type of approach on this.  Further decisions will be made following a GI evaluation.     Arturo Morton. Riley Kill, MD, [facility]     TDS/MEDQ  D:  06/28/2010  T:  06/29/2010  Job:  [id number]  cc:   Jonelle Sidle, MD Ladona Horns. Mariel Sleet, MD Dr. [facility]  Electronically Signed by Shawnie Pons MD [facility] on 07/26/2010 09:09:23 PM

## 2010-08-02 DIAGNOSIS — C61 Malignant neoplasm of prostate: Secondary | ICD-10-CM

## 2010-08-02 DIAGNOSIS — K219 Gastro-esophageal reflux disease without esophagitis: Secondary | ICD-10-CM

## 2010-08-09 ENCOUNTER — Ambulatory Visit (HOSPITAL_COMMUNITY): Payer: Medicare Other

## 2010-08-09 DIAGNOSIS — C7951 Secondary malignant neoplasm of bone: Secondary | ICD-10-CM

## 2010-08-09 DIAGNOSIS — C7952 Secondary malignant neoplasm of bone marrow: Secondary | ICD-10-CM

## 2010-08-09 DIAGNOSIS — C61 Malignant neoplasm of prostate: Secondary | ICD-10-CM

## 2010-08-14 LAB — BASIC METABOLIC PANEL
BUN: 5 mg/dL — ABNORMAL LOW (ref 6–23)
CO2: 24 mEq/L (ref 19–32)
Calcium: 8.9 mg/dL (ref 8.4–10.5)
Chloride: 107 mEq/L (ref 96–112)
Creatinine, Ser: 0.98 mg/dL (ref 0.4–1.5)
GFR calc Af Amer: >60 mL/min (ref >60)
GFR calc non Af Amer: >60 mL/min (ref >60)
Glucose, Bld: 98 mg/dL (ref 70–99)
Potassium: 4 mEq/L (ref 3.5–5.1)
Sodium: 139 mEq/L (ref 135–145)

## 2010-08-14 LAB — HEMOGLOBIN AND HEMATOCRIT, BLOOD
HCT: 35.2 % — ABNORMAL LOW (ref 39.0–52.0)
Hemoglobin: 12.2 g/dL — ABNORMAL LOW (ref 13.0–17.0)

## 2010-08-15 LAB — BASIC METABOLIC PANEL
BUN: 10 mg/dL (ref 6–23)
CO2: 26 mEq/L (ref 19–32)
Calcium: 8.7 mg/dL (ref 8.4–10.5)
Chloride: 103 mEq/L (ref 96–112)
Creatinine, Ser: 1.01 mg/dL (ref 0.4–1.5)
GFR calc Af Amer: >60 mL/min (ref >60)
GFR calc non Af Amer: >60 mL/min (ref >60)
Glucose, Bld: 74 mg/dL (ref 70–99)
Potassium: 3.8 mEq/L (ref 3.5–5.1)
Sodium: 139 mEq/L (ref 135–145)

## 2010-08-15 LAB — PSA: PSA: 0 ng/mL — ABNORMAL LOW (ref <=4.00)

## 2010-08-15 LAB — HEMOGLOBIN AND HEMATOCRIT, BLOOD
HCT: 35.6 % — ABNORMAL LOW (ref 39.0–52.0)
Hemoglobin: 12 g/dL — ABNORMAL LOW (ref 13.0–17.0)

## 2010-08-16 LAB — COMPREHENSIVE METABOLIC PANEL
ALT: 23 U/L (ref 0–53)
AST: 20 U/L (ref 0–37)
Albumin: 4.3 g/dL (ref 3.5–5.2)
Alkaline Phosphatase: 51 U/L (ref 39–117)
BUN: 10 mg/dL (ref 6–23)
CO2: 28 mEq/L (ref 19–32)
Calcium: 9.1 mg/dL (ref 8.4–10.5)
Chloride: 103 mEq/L (ref 96–112)
Creatinine, Ser: 0.75 mg/dL (ref 0.4–1.5)
GFR calc Af Amer: >60 mL/min (ref >60)
GFR calc non Af Amer: >60 mL/min (ref >60)
Glucose, Bld: 95 mg/dL (ref 70–99)
Potassium: 4.3 mEq/L (ref 3.5–5.1)
Sodium: 140 mEq/L (ref 135–145)
Total Bilirubin: 0.4 mg/dL (ref 0.3–1.2)
Total Protein: 6.9 g/dL (ref 6.0–8.3)

## 2010-08-16 LAB — CBC
HCT: 37.2 % — ABNORMAL LOW (ref 39.0–52.0)
Hemoglobin: 12.7 g/dL — ABNORMAL LOW (ref 13.0–17.0)
MCH: 32.2 pg (ref 26.0–34.0)
MCHC: 34 g/dL (ref 30.0–36.0)
MCV: 94.6 fL (ref 78.0–100.0)
Platelets: 291 10*3/uL (ref 150–400)
RBC: 3.94 MIL/uL — ABNORMAL LOW (ref 4.22–5.81)
RDW: 14.7 % (ref 11.5–15.5)
WBC: 11.8 10*3/uL — ABNORMAL HIGH (ref 4.0–10.5)

## 2010-08-16 LAB — DIFFERENTIAL
Basophils Absolute: 0.1 10*3/uL (ref 0.0–0.1)
Basophils Relative: 1 % (ref 0–1)
Eosinophils Absolute: 0 10*3/uL (ref 0.0–0.7)
Eosinophils Relative: 0 % (ref 0–5)
Lymphocytes Relative: 23 % (ref 12–46)
Lymphs Abs: 2.7 10*3/uL (ref 0.7–4.0)
Monocytes Absolute: 0.9 10*3/uL (ref 0.1–1.0)
Monocytes Relative: 8 % (ref 3–12)
Neutro Abs: 8.1 10*3/uL — ABNORMAL HIGH (ref 1.7–7.7)
Neutrophils Relative %: 69 % (ref 43–77)

## 2010-08-16 LAB — PSA: PSA: 0.03 ng/mL — ABNORMAL LOW (ref 0.10–4.00)

## 2010-08-19 LAB — COMPREHENSIVE METABOLIC PANEL
ALT: 13 U/L (ref 0–53)
AST: 20 U/L (ref 0–37)
Albumin: 4.3 g/dL (ref 3.5–5.2)
Alkaline Phosphatase: 52 U/L (ref 39–117)
BUN: 4 mg/dL — ABNORMAL LOW (ref 6–23)
CO2: 25 mEq/L (ref 19–32)
Calcium: 8.6 mg/dL (ref 8.4–10.5)
Chloride: 108 mEq/L (ref 96–112)
Creatinine, Ser: 0.85 mg/dL (ref 0.4–1.5)
GFR calc Af Amer: >60 mL/min (ref >60)
GFR calc non Af Amer: >60 mL/min (ref >60)
Glucose, Bld: 114 mg/dL — ABNORMAL HIGH (ref 70–99)
Potassium: 4 mEq/L (ref 3.5–5.1)
Sodium: 139 mEq/L (ref 135–145)
Total Bilirubin: 0.5 mg/dL (ref 0.3–1.2)
Total Protein: 6.5 g/dL (ref 6.0–8.3)

## 2010-08-19 LAB — DIFFERENTIAL
Basophils Absolute: 0 10*3/uL (ref 0.0–0.1)
Basophils Relative: 1 % (ref 0–1)
Eosinophils Absolute: 0.2 10*3/uL (ref 0.0–0.7)
Eosinophils Relative: 4 % (ref 0–5)
Lymphocytes Relative: 35 % (ref 12–46)
Lymphs Abs: 2.2 10*3/uL (ref 0.7–4.0)
Monocytes Absolute: 0.5 10*3/uL (ref 0.1–1.0)
Monocytes Relative: 7 % (ref 3–12)
Neutro Abs: 3.3 10*3/uL (ref 1.7–7.7)
Neutrophils Relative %: 53 % (ref 43–77)

## 2010-08-19 LAB — CBC
HCT: 33.8 % — ABNORMAL LOW (ref 39.0–52.0)
Hemoglobin: 11.5 g/dL — ABNORMAL LOW (ref 13.0–17.0)
MCH: 30.9 pg (ref 26.0–34.0)
MCHC: 34.2 g/dL (ref 30.0–36.0)
MCV: 90.3 fL (ref 78.0–100.0)
Platelets: 292 10*3/uL (ref 150–400)
RBC: 3.74 MIL/uL — ABNORMAL LOW (ref 4.22–5.81)
RDW: 14.2 % (ref 11.5–15.5)
WBC: 6.2 10*3/uL (ref 4.0–10.5)

## 2010-08-19 LAB — PSA: PSA: <0.01 ng/mL — ABNORMAL LOW (ref 0.10–4.00)

## 2010-08-20 LAB — COMPREHENSIVE METABOLIC PANEL
ALT: 19 U/L (ref 0–53)
AST: 23 U/L (ref 0–37)
Albumin: 4.3 g/dL (ref 3.5–5.2)
Alkaline Phosphatase: 64 U/L (ref 39–117)
BUN: 14 mg/dL (ref 6–23)
CO2: 25 mEq/L (ref 19–32)
Calcium: 9.1 mg/dL (ref 8.4–10.5)
Chloride: 105 mEq/L (ref 96–112)
Creatinine, Ser: 0.99 mg/dL (ref 0.4–1.5)
GFR calc Af Amer: >60 mL/min (ref >60)
GFR calc non Af Amer: >60 mL/min (ref >60)
Glucose, Bld: 90 mg/dL (ref 70–99)
Potassium: 3.5 mEq/L (ref 3.5–5.1)
Sodium: 139 mEq/L (ref 135–145)
Total Bilirubin: 0.8 mg/dL (ref 0.3–1.2)
Total Protein: 7 g/dL (ref 6.0–8.3)

## 2010-08-20 LAB — DIFFERENTIAL
Basophils Absolute: 0 10*3/uL (ref 0.0–0.1)
Basophils Relative: 1 % (ref 0–1)
Eosinophils Absolute: 0.3 10*3/uL (ref 0.0–0.7)
Eosinophils Relative: 4 % (ref 0–5)
Lymphocytes Relative: 31 % (ref 12–46)
Lymphs Abs: 2.1 10*3/uL (ref 0.7–4.0)
Monocytes Absolute: 0.6 10*3/uL (ref 0.1–1.0)
Monocytes Relative: 9 % (ref 3–12)
Neutro Abs: 3.8 10*3/uL (ref 1.7–7.7)
Neutrophils Relative %: 56 % (ref 43–77)

## 2010-08-20 LAB — CBC
HCT: 37 % — ABNORMAL LOW (ref 39.0–52.0)
Hemoglobin: 12.7 g/dL — ABNORMAL LOW (ref 13.0–17.0)
MCHC: 34.5 g/dL (ref 30.0–36.0)
MCV: 92.4 fL (ref 78.0–100.0)
Platelets: 281 10*3/uL (ref 150–400)
RBC: 4 MIL/uL — ABNORMAL LOW (ref 4.22–5.81)
RDW: 13.4 % (ref 11.5–15.5)
WBC: 6.8 10*3/uL (ref 4.0–10.5)

## 2010-08-20 LAB — PSA: PSA: <0.01 ng/mL — ABNORMAL LOW (ref 0.10–4.00)

## 2010-08-22 LAB — PSA: PSA: <0.01 ng/mL — ABNORMAL LOW (ref 0.10–4.00)

## 2010-08-25 ENCOUNTER — Ambulatory Visit (INDEPENDENT_AMBULATORY_CARE_PROVIDER_SITE_OTHER): Payer: Medicare Other | Admitting: Cardiology

## 2010-08-25 ENCOUNTER — Other Ambulatory Visit: Payer: Self-pay | Admitting: Cardiology

## 2010-08-25 ENCOUNTER — Encounter: Payer: Self-pay | Admitting: Cardiology

## 2010-08-25 DIAGNOSIS — K625 Hemorrhage of anus and rectum: Secondary | ICD-10-CM

## 2010-08-25 DIAGNOSIS — K219 Gastro-esophageal reflux disease without esophagitis: Secondary | ICD-10-CM

## 2010-08-25 DIAGNOSIS — E782 Mixed hyperlipidemia: Secondary | ICD-10-CM

## 2010-08-25 DIAGNOSIS — I10 Essential (primary) hypertension: Secondary | ICD-10-CM

## 2010-08-25 DIAGNOSIS — I2510 Atherosclerotic heart disease of native coronary artery without angina pectoris: Secondary | ICD-10-CM

## 2010-08-25 MED ORDER — ASPIRIN EC 81 MG PO TBEC: 81.0000 mg | DELAYED_RELEASE_TABLET | Freq: Every day | ORAL | Status: AC

## 2010-08-25 NOTE — Patient Instructions (Signed)
**Note De-Identified [name] [name] Obfuscation** Your physician recommends that you schedule a follow-up appointment in: 4 months Your physician recommends that you return for lab work in: December 18, 2010, just before next office visit

## 2010-08-25 NOTE — Assessment & Plan Note (Signed)
Symptomatically stable on medical therapy. Continue regular exercise, followup in 4 months.

## 2010-08-25 NOTE — Assessment & Plan Note (Signed)
Continue current dose of Pravachol, with plan for fasting lipid profile and liver function test around the time of his next visit. Aiming for LDL control closer to 70.

## 2010-08-25 NOTE — Progress Notes (Signed)
Clinical Summary [name] [name] is a 55 y.o.male presenting for followup. He was seen in February. He states that he is feeling fairly well, no active chest pain or progressive shortness of breath. He states he is trying to walk for exercise, usually at least a mile at a time. He reports compliance with medications, outlined below.  Followup labs from 19 March showed cholesterol 164, triglycerides118, HDL 46, LDL 94, AST 21, ALT 16. LDL trend is better, however not yet at goal.  He does report intermittent hematochezia. We discussed his GI workup done earlier in the year. I suspect this is related to his internal hemorrhoids. He has been able to continue dual antiplatelet therapy however.   Allergies  Allergen Reactions  . Zoledronic Acid     Current outpatient prescriptions:ALPRAZolam (XANAX) 0.5 MG tablet, Take 0.5 mg by mouth 3 (three) times daily.  , Disp: , Rfl: ;  aspirin EC 81 MG EC tablet, Take 1 tablet (81 mg total) by mouth daily., Disp: 150 tablet, Rfl: 2;  CAPEX 0.01 % SHAM, , Disp: , Rfl: ;  CLOBEX SPRAY 0.05 % external spray, , Disp: , Rfl: ;  clopidogrel (PLAVIX) 75 MG tablet, Take 75 mg by mouth daily.  , Disp: , Rfl:  denosumab (PROLIA) 60 MG/ML SOLN, Inject 60 mg into the skin as directed.  , Disp: , Rfl: ;  ENDOCET 10-325 MG per tablet, , Disp: , Rfl: ;  gabapentin (NEURONTIN) 300 MG capsule, Take 300 mg by mouth at bedtime. TAKE TWO TABLETS AT BEDTIME. , Disp: , Rfl: ;  leuprolide (LUPRON) 3.75 MG injection, Inject 3.75 mg into the muscle as directed.  , Disp: , Rfl: ;  methocarbamol (ROBAXIN) 500 MG tablet, , Disp: , Rfl:  metoprolol succinate (TOPROL-XL) 25 MG 24 hr tablet, Take 25 mg by mouth daily.  , Disp: , Rfl: ;  nitroGLYCERIN (NITROSTAT) 0.4 MG SL tablet, Place 0.4 mg under the tongue every 5 (five) minutes as needed. FOR CHEST PAIN. , Disp: , Rfl: ;  oxyCODONE (OXYCONTIN) 20 MG 12 hr tablet, Take 20 mg by mouth every 12 (twelve) hours.  , Disp: , Rfl: ;  pantoprazole  (PROTONIX) 40 MG tablet, Take 40 mg by mouth daily.  , Disp: , Rfl:  pravastatin (PRAVACHOL) 40 MG tablet, Take 40 mg by mouth daily.  , Disp: , Rfl: ;  TACLONEX ointment, , Disp: , Rfl: ;  VENTOLIN HFA 108 (90 BASE) MCG/ACT inhaler, , Disp: , Rfl: ;  ranitidine (ZANTAC) 300 MG tablet, , Disp: , Rfl: ;  TRILYTE 420 G solution, , Disp: , Rfl:   Past Medical History  Diagnosis Date  . Coronary artery disease     DES to LAD 1/12  . COPD (chronic obstructive pulmonary disease)   . Sigmoid diverticulosis   . DDD (degenerative disc disease)   . Anxiety   . Depression   . Psoriasis   . Tubular adenoma     Colonoscopy 1/12  . Horseshoe kidney     Single kidney  . Prostate cancer     Metastatic, stage IV  - Dr. Mariel Sleet  . Internal hemorrhoids     Colonoscopy 1/12  . Drug reaction     Pamidronate - induced skin toxicity, Zometa - induced toxicity  . GERD (gastroesophageal reflux disease)   . Gastritis     Social History [name] [name] reports that he quit smoking about 3 years ago. His smoking use included Cigarettes. He has a 15 pack-year smoking  history. He has never used smokeless tobacco. [name] [name] reports that he does not drink alcohol.  Review of Systems No fevers, chills, or unusual weight change. No chest pain, progressive shortness of breath, cough, hemoptysis, or wheezing. No orthopnea, PND, or lower extremity edema. No focal motor weakness, memory problems, or speech deficits. Otherwise systems reviewed and negative except as already outlined.   Physical Examination Filed Vitals:   08/25/10 1318  BP: 120/77  Pulse: 52  Chronically ill-appearing male in no acute distress.   HEENT: Conjunctiva and lids normal, oropharynx with poor dentition.   Neck: Supple, no elevated JVP or loud bruits, no thyromegaly.   Lungs: Diminished breath sounds, nonlabored, no wheezing.   Cardiac: Regular rate and rhythm, soft systolic murmur at the base, but her second heart sound, no S3.    Abdomen: Soft, nontender, bowel sounds present.   Skin: Warm and dry, scattered ecchymoses particularly in the forearms.   Musculoskeletal: No kyphosis.   Extremities: No pitting edema, distal pulses one plus.   Neuropsychiatric: Alert and oriented x3, affect appropriate.   Problem List and Plan

## 2010-08-25 NOTE — Assessment & Plan Note (Signed)
Intermittent hematochezia, likely related to documented internal hemorrhoids based on colonoscopy in January. Do not plan to interrupt dual antiplatelet therapy at this time in light of recently placed drug-eluting stents. I asked him to keep a close eye on this, increase fiber in his diet, consider stool softeners if necessary. Followup CBC for his next visit. If symptoms worsen, he should let us know sooner.

## 2010-08-25 NOTE — Assessment & Plan Note (Signed)
Continue on proton pump inhibitor.

## 2010-08-30 NOTE — Progress Notes (Signed)
**Note De-Identified [name] [name] Obfuscation** Labs are scheduled for the week of December 18, 2010. Orders will be mailed to pt's address at that time.

## 2010-09-06 LAB — CBC
HCT: 37.8 % — ABNORMAL LOW (ref 39.0–52.0)
Hemoglobin: 13 g/dL (ref 13.0–17.0)
MCHC: 34.5 g/dL (ref 30.0–36.0)
MCV: 92.4 fL (ref 78.0–100.0)
Platelets: 285 K/uL (ref 150–400)
RBC: 4.09 MIL/uL — ABNORMAL LOW (ref 4.22–5.81)
RDW: 13.2 % (ref 11.5–15.5)
WBC: 7 K/uL (ref 4.0–10.5)

## 2010-09-06 LAB — COMPREHENSIVE METABOLIC PANEL
ALT: 21 U/L (ref 0–53)
AST: 30 U/L (ref 0–37)
Albumin: 4.4 g/dL (ref 3.5–5.2)
Alkaline Phosphatase: 56 U/L (ref 39–117)
BUN: 10 mg/dL (ref 6–23)
CO2: 31 mEq/L (ref 19–32)
Calcium: 8.9 mg/dL (ref 8.4–10.5)
Chloride: 102 mEq/L (ref 96–112)
Creatinine, Ser: 0.98 mg/dL (ref 0.4–1.5)
GFR calc Af Amer: >60 mL/min (ref >60)
GFR calc non Af Amer: >60 mL/min (ref >60)
Glucose, Bld: 76 mg/dL (ref 70–99)
Potassium: 4 mEq/L (ref 3.5–5.1)
Sodium: 137 mEq/L (ref 135–145)
Total Bilirubin: 0.4 mg/dL (ref 0.3–1.2)
Total Protein: 6.9 g/dL (ref 6.0–8.3)

## 2010-09-06 LAB — PSA: PSA: 0.01 ng/mL — ABNORMAL LOW (ref 0.10–4.00)

## 2010-09-08 ENCOUNTER — Encounter: Payer: Self-pay | Admitting: *Deleted

## 2010-09-09 LAB — CBC
HCT: 34.7 % — ABNORMAL LOW (ref 39.0–52.0)
Hemoglobin: 12.2 g/dL — ABNORMAL LOW (ref 13.0–17.0)
MCHC: 35 g/dL (ref 30.0–36.0)
MCV: 92.1 fL (ref 78.0–100.0)
Platelets: 261 10*3/uL (ref 150–400)
RBC: 3.77 MIL/uL — ABNORMAL LOW (ref 4.22–5.81)
RDW: 13.9 % (ref 11.5–15.5)
WBC: 6.8 10*3/uL (ref 4.0–10.5)

## 2010-09-09 LAB — BASIC METABOLIC PANEL
BUN: 10 mg/dL (ref 6–23)
CO2: 28 mEq/L (ref 19–32)
Calcium: 9.1 mg/dL (ref 8.4–10.5)
Chloride: 108 mEq/L (ref 96–112)
Creatinine, Ser: 0.97 mg/dL (ref 0.4–1.5)
GFR calc Af Amer: >60 mL/min (ref >60)
GFR calc non Af Amer: >60 mL/min (ref >60)
Glucose, Bld: 58 mg/dL — ABNORMAL LOW (ref 70–99)
Potassium: 3.8 mEq/L (ref 3.5–5.1)
Sodium: 142 mEq/L (ref 135–145)

## 2010-09-09 LAB — PSA: PSA: <0.01 ng/mL — ABNORMAL LOW (ref 0.10–4.00)

## 2010-09-11 LAB — PSA: PSA: <0.01 ng/mL — ABNORMAL LOW (ref 0.10–4.00)

## 2010-09-14 LAB — COMPREHENSIVE METABOLIC PANEL
ALT: 17 U/L (ref 0–53)
AST: 25 U/L (ref 0–37)
Albumin: 4.5 g/dL (ref 3.5–5.2)
Alkaline Phosphatase: 63 U/L (ref 39–117)
BUN: 7 mg/dL (ref 6–23)
CO2: 22 mEq/L (ref 19–32)
Calcium: 9.3 mg/dL (ref 8.4–10.5)
Chloride: 103 mEq/L (ref 96–112)
Creatinine, Ser: 1.13 mg/dL (ref 0.4–1.5)
GFR calc Af Amer: >60 mL/min (ref >60)
GFR calc non Af Amer: >60 mL/min (ref >60)
Glucose, Bld: 116 mg/dL — ABNORMAL HIGH (ref 70–99)
Potassium: 3 mEq/L — ABNORMAL LOW (ref 3.5–5.1)
Sodium: 138 mEq/L (ref 135–145)
Total Bilirubin: 0.6 mg/dL (ref 0.3–1.2)
Total Protein: 7.2 g/dL (ref 6.0–8.3)

## 2010-09-14 LAB — CBC
HCT: 39.1 % (ref 39.0–52.0)
Hemoglobin: 13.4 g/dL (ref 13.0–17.0)
MCHC: 34.3 g/dL (ref 30.0–36.0)
MCV: 88.4 fL (ref 78.0–100.0)
Platelets: 267 10*3/uL (ref 150–400)
RBC: 4.43 MIL/uL (ref 4.22–5.81)
RDW: 14 % (ref 11.5–15.5)
WBC: 8.8 10*3/uL (ref 4.0–10.5)

## 2010-09-14 LAB — PSA: PSA: 0 ng/mL — ABNORMAL LOW (ref 0.10–4.00)

## 2010-09-19 LAB — CBC
HCT: 40.8 % (ref 39.0–52.0)
Hemoglobin: 13.5 g/dL (ref 13.0–17.0)
MCHC: 33 g/dL (ref 30.0–36.0)
MCV: 91.5 fL (ref 78.0–100.0)
Platelets: 286 10*3/uL (ref 150–400)
RBC: 4.47 MIL/uL (ref 4.22–5.81)
RDW: 13.5 % (ref 11.5–15.5)
WBC: 7.3 10*3/uL (ref 4.0–10.5)

## 2010-09-19 LAB — BASIC METABOLIC PANEL
BUN: 8 mg/dL (ref 6–23)
CO2: 26 mEq/L (ref 19–32)
Calcium: 9.3 mg/dL (ref 8.4–10.5)
Chloride: 103 mEq/L (ref 96–112)
Creatinine, Ser: 0.92 mg/dL (ref 0.4–1.5)
GFR calc Af Amer: >60 mL/min (ref >60)
GFR calc non Af Amer: >60 mL/min (ref >60)
Glucose, Bld: 91 mg/dL (ref 70–99)
Potassium: 4 mEq/L (ref 3.5–5.1)
Sodium: 138 mEq/L (ref 135–145)

## 2010-09-20 ENCOUNTER — Other Ambulatory Visit (HOSPITAL_COMMUNITY): Payer: Self-pay | Admitting: Oncology

## 2010-09-20 ENCOUNTER — Encounter (HOSPITAL_COMMUNITY): Payer: Medicare Other

## 2010-09-20 ENCOUNTER — Encounter (HOSPITAL_COMMUNITY): Payer: Medicare Other | Attending: Oncology | Admitting: Oncology

## 2010-09-20 DIAGNOSIS — C7951 Secondary malignant neoplasm of bone: Secondary | ICD-10-CM

## 2010-09-20 DIAGNOSIS — C61 Malignant neoplasm of prostate: Secondary | ICD-10-CM | POA: Insufficient documentation

## 2010-09-20 DIAGNOSIS — Z79899 Other long term (current) drug therapy: Secondary | ICD-10-CM | POA: Insufficient documentation

## 2010-09-20 DIAGNOSIS — C7952 Secondary malignant neoplasm of bone marrow: Secondary | ICD-10-CM

## 2010-09-21 LAB — PSA: PSA: <0.01 ng/mL — ABNORMAL LOW (ref <=4.00)

## 2010-10-17 NOTE — Procedures (Signed)
[name] [name], GILL NO.:  [id number]   MEDICAL RECORD NO.:  [id number]          PATIENT TYPE:  OUT   LOCATION:  [facility]                          FACILITY:  [facility]   PHYSICIAN:  [name], M.D.DATE OF BIRTH:  1955-07-26   DATE OF PROCEDURE:  04/01/2008  DATE OF DISCHARGE:                                  STRESS TEST   HISTORY:  The patient is a 55 year old Caucasian male with a history of  prostate cancer.  He has taken oral Actonel for osteoporosis the past  couple of years.  His most recent bone scan showed no significant  improvement, in fact worsening.  Based on this, his urologist proceeded  with IV therapy.  The patient had an IV medication, I believe it to be  Forteo.  He had a response which included chest pain.  He was sent to  the emergency room and evaluated by the ER doctors there, they felt the  patient should have a stress test to further stratify his risk of having  underlying coronary artery disease.   STRESS TEST:  Stress test was performed in standard Bruce protocol.  Resting EKG revealed normal sinus rhythm.  No significant ST-T changes.  The patient had normal blood pressure to start off.  He tolerated the  first stage well.  Second stage, the patient had increased heart rate.  His sub max predicted heart rate was 142.  He surpassed this 30 seconds  into the third stage and reached above 150s.  He had some shortness of  breath, but at no point experienced chest pain.  At the peak rate, his  blood pressure had an expected hypertensive response at 170/90 blood  pressure.  His EKG at 0.08 seconds past the J-point revealed sharply  ascending slope with minimal ST depression.   IMPRESSION:  Negative adequate stress test.   PLAN:  The patient encouraged to get on exercise program.      W. Simone Curia, M.D.  Electronically Signed     WSL/MEDQ  D:  04/01/2008  T:  04/02/2008  Job:  [id number]

## 2010-10-17 NOTE — Op Note (Signed)
[name] [name], [name] [name]                ACCOUNT NO.:  [id number]   MEDICAL RECORD NO.:  [id number]          PATIENT TYPE:  OIB   LOCATION:  3039                         FACILITY:  [facility]   PHYSICIAN:  [name]. [name], M.D.  DATE OF BIRTH:  Apr 20, 1956   DATE OF PROCEDURE:  03/11/2007  DATE OF DISCHARGE:                               OPERATIVE REPORT   PREOPERATIVE DIAGNOSIS:  Herniated cervical disk with spondylosis,  degenerative disk disease and radiculopathy, C5-6 and C6-7 levels.   POSTOPERATIVE DIAGNOSIS:  Herniated cervical disk with spondylosis,  degenerative disk disease and radiculopathy, C5-6 and C6-7 levels.   PROCEDURE:  Anterior cervical decompression and fusion C5-6 and C6-7  with allograft bone along with morselized bone autograft and anterior  cervical plate.   SURGEON:  [name]. [name], M.D.   ASSISTANT:  Hewitt Shorts, M.D. and Georgiann Cocker, RN   ANESTHESIA:  General endotracheal anesthesia.   ESTIMATED BLOOD LOSS:  Less than 50 mL.   COMPLICATIONS:  None.   DISPOSITION:  Recovery.   INDICATIONS:  [name] [name] is a 55 year old man with cervical  spondylosis and foraminal stenosis left greater than right at C5-6 and  C6-7.  It was elected to take him to surgery for anterior cervical  decompression and fusion at these affected levels.   PROCEDURE:  [name] was brought to the operating room.  Following  satisfactory uncomplicated induction of general endotracheal anesthesia  placement of, intravenous lines, the patient was placed in supine  position on the operating table.  His neck was placed in slight  extension.  He was placed in 10 pounds halter traction and anterior neck  was then prepped and draped in usual sterile fashion.  The area of  planned incision was infiltrated with 0.25% Marcaine and 0.5% lidocaine  with 1:200,000 epinephrine.  Incision was made from midline to the  anterior border sternocleidomastoid muscle on the left side of midline,  carried sharply through platysma layer.  Subplatysmal dissection was  performed exposing the anterior border sternocleidomastoid muscle, using  blunt dissection the carotid sheath was kept lateral, trachea and  esophagus kept medial exposing the anterior cervical spine.  Bent spinal  needle was placed at what was felt to be the C5-6 level and this was  confirmed on intraoperative x-ray.  Subsequently longus colli muscles  were taken down from the anterior cervical spine from C5-C7 bilaterally  using electrocautery and Key elevator and self-retaining Shadow Line  retractors placed along with up and down retractors to facilitate  exposure.  Disk space retractor pins were then placed at C6 and C7, the  interspace was incised and distracted slightly using a variety of Carlen  curettes.  The endplates were stripped of residual disk material and  then using high-speed drill the endplates were decorticated.  Uncinate  spurs were drilled down, diskectomy was completed with 2 and 3 mm gold  tip Kerrison rongeurs. Both the central spinal cord dura and both C7  nerve roots widely decompressed.  Hemostasis was assured with Gelfoam  soaked in thrombin.  Attention was then turned to the C5-6  level where  similar distraction was performed, interspace was incised and disk  material was removed in piecemeal fashion.  The endplates were  decorticated stripped of residual cartilaginous material and uncinate  spurs were drilled down.  Both the C6 nerve roots and the central spinal  cord dura were decompressed under microscopic visualization and both  neural foramina were both felt to be widely decompressed.  Hemostasis  was assured.  After trial sizing, a 6-mm medium sized  cortical  allograft wedge was fashioned with the high-speed drill, packed with  morcellized bone autograft which was preserved from the drilling of the  endplates, inserted in the interspace and countersunk appropriately.  Attention then  turned to the C6-7 level where a 7 mm graft was then  again fashioned and packed with morselized bone autograft graft,  inserted in the interspace, countersunk appropriately.  Traction weight  was removed. A 32 mm anterior cervical plate was then affixed to the  anterior cervical spine.  This was a Trestle plate and the 14 mm  variable angle screws used, two at each level.  Final x-ray demonstrated  the upper aspect of the construct appeared to be visualized was correct  level.  Hemostasis was then assured and soft tissues inspected and found  to be in good repair.  The platysma layer was then closed with 3-0  Vicryl suture.  The skin edges were approximated with 3-0 Vicryl  interrupted inverted sutures and the wound was dressed with Dermabond.  The patient was extubated in the operating room and taken to the  recovery room in stable satisfactory condition, having tolerated the  operation well.  Counts correct at the end of the case.      [name]. [name], M.D.  Electronically Signed     JDS/MEDQ  D:  03/11/2007  T:  03/12/2007  Job:  [id number]

## 2010-10-17 NOTE — Op Note (Signed)
NAMEJAIVYN, [name] [name]                ACCOUNT NO.:  [id number]   MEDICAL RECORD NO.:  [id number]          PATIENT TYPE:  OIB   LOCATION:  3528                         FACILITY:  [facility]   PHYSICIAN:  [name]. [name], M.D.  DATE OF BIRTH:  12/19/1955   DATE OF PROCEDURE:  07/08/2008  DATE OF DISCHARGE:                               OPERATIVE REPORT   PREOPERATIVE DIAGNOSIS:  Pseudoarthrosis with neck pain, status post C5  through C7 anterior cervical fusion.   POSTOPERATIVE DIAGNOSIS:  Pseudoarthrosis with neck pain, status post C5  through C7 anterior cervical fusion.   PROCEDURE:  Posterior cervical fusion, C5 through C7 with lateral mass  screws, posterolateral arthrodesis with bone morphogenic protein and  EquivaBone.   SURGEON:  [name]. [name], MD   ASSISTANTS:  1. Georgiann Cocker, RN  2. Clydene Fake, MD   ANESTHESIA:  General endotracheal anesthesia.   ESTIMATED BLOOD LOSS:  Minimal.   COMPLICATIONS:  None.   DISPOSITION:  To recovery.   INDICATIONS:  [name] [name] is a 55 year old man who had previously  undergone anterior cervical decompression and fusion for cervical  radiculopathy.  He had spondylosis.  He has prostate cancer, is on  Lupron injections, has osteoporosis, undergone radiation therapy, and  was a smoker.  He did not heal from his two-level anterior cervical  fusion and had no evidence of bone incorporation across the grafts and  was complaining of significant neck pain.  Preoperative myelography  demonstrated no evidence of any other disk disease.  It was elected to  take him to surgery for posterior cervical fusion at these affected  levels.   PROCEDURE:  Mr. Hatfield was brought to the operating room.  Following  satisfactory and uncomplicated induction of general endotracheal  anesthesia and placement of intravenous lines and a Foley catheter, he  was placed in 3-pin head fixation and was turned into prone position on  chest rolls.  His neck was  maintained in neutral to slightly flexed  alignment.  His posterior neck was then prepped and draped in the usual  sterile fashion.  The area of planned incision was infiltrated with  local lidocaine.  An incision was made overlying the spinous processes  of C5 through C7 and carried through the midline avascular plane and  subperiosteal dissection was performed exposing the C5, C6, and C7  lateral masses.  Intraoperative x-ray confirmed the towel clip on the C5  spinous process and the awl in the C5 lateral mass.  By using C-arm  fluoroscopy, the 12-mm lateral mass screws were placed at C5, C6, and C7  bilaterally using the standard landmarks and trajectories.  The  intraoperative x-ray demonstrated well-positioned lateral mass screws.  There were no evidence any cutouts.  The rods were then lordosed  appropriately to fit of the screws and locked down in situ.  The facet  joints were decorticated and laminae and lateral masses were also  decorticated with a high-speed drill and extra small BMP was utilized  with 10 mL of EquivaBone which was packed overlying the grafts over the  BMP.  The self-retaining retractor was removed.  The posterior cervical  fascia was reapproximated with 0 Vicryl sutures, subcutaneous tissues  were reapproximated with 2-0 Vicryl interrupted inverted sutures, and  skin edges were reapproximated with interrupted 3-0 Vicryl subcuticular  stitch.  The wound was dressed with Benzoin, Steri-Strips, Telfa gauze,  and tape.  He was placed in a cervical collar, taken out of 3-pin head  fixation, extubated, and taken to recovery, having tolerated the  procedure without difficulty.      [name]. [name], M.D.  Electronically Signed     JDS/MEDQ  D:  07/08/2008  T:  07/09/2008  Job:  [id number]

## 2010-10-18 ENCOUNTER — Other Ambulatory Visit (HOSPITAL_COMMUNITY): Payer: Self-pay | Admitting: Oncology

## 2010-10-18 ENCOUNTER — Encounter (HOSPITAL_COMMUNITY): Payer: Medicare Other | Attending: Oncology

## 2010-10-18 DIAGNOSIS — C7951 Secondary malignant neoplasm of bone: Secondary | ICD-10-CM

## 2010-10-18 DIAGNOSIS — C61 Malignant neoplasm of prostate: Secondary | ICD-10-CM | POA: Insufficient documentation

## 2010-10-18 DIAGNOSIS — C7952 Secondary malignant neoplasm of bone marrow: Secondary | ICD-10-CM

## 2010-10-18 DIAGNOSIS — Z79899 Other long term (current) drug therapy: Secondary | ICD-10-CM | POA: Insufficient documentation

## 2010-10-18 LAB — COMPREHENSIVE METABOLIC PANEL
ALT: 17 U/L (ref 0–53)
AST: 20 U/L (ref 0–37)
Albumin: 4.3 g/dL (ref 3.5–5.2)
Alkaline Phosphatase: 58 U/L (ref 39–117)
BUN: 14 mg/dL (ref 6–23)
CO2: 25 mEq/L (ref 19–32)
Calcium: 9.7 mg/dL (ref 8.4–10.5)
Chloride: 104 mEq/L (ref 96–112)
Creatinine, Ser: 0.94 mg/dL (ref 0.4–1.5)
GFR calc Af Amer: >60 mL/min (ref >60)
GFR calc non Af Amer: >60 mL/min (ref >60)
Glucose, Bld: 110 mg/dL — ABNORMAL HIGH (ref 70–99)
Potassium: 3.7 mEq/L (ref 3.5–5.1)
Sodium: 139 mEq/L (ref 135–145)
Total Bilirubin: 0.3 mg/dL (ref 0.3–1.2)
Total Protein: 7.6 g/dL (ref 6.0–8.3)

## 2010-10-18 LAB — DIFFERENTIAL
Basophils Absolute: 0.1 10*3/uL (ref 0.0–0.1)
Basophils Relative: 1 % (ref 0–1)
Eosinophils Absolute: 0.3 10*3/uL (ref 0.0–0.7)
Eosinophils Relative: 5 % (ref 0–5)
Lymphocytes Relative: 39 % (ref 12–46)
Lymphs Abs: 2.5 10*3/uL (ref 0.7–4.0)
Monocytes Absolute: 0.5 10*3/uL (ref 0.1–1.0)
Monocytes Relative: 8 % (ref 3–12)
Neutro Abs: 3.1 10*3/uL (ref 1.7–7.7)
Neutrophils Relative %: 48 % (ref 43–77)

## 2010-10-18 LAB — CBC
HCT: 39.1 % (ref 39.0–52.0)
Hemoglobin: 12.7 g/dL — ABNORMAL LOW (ref 13.0–17.0)
MCH: 29.2 pg (ref 26.0–34.0)
MCHC: 32.5 g/dL (ref 30.0–36.0)
MCV: 89.9 fL (ref 78.0–100.0)
Platelets: 332 10*3/uL (ref 150–400)
RBC: 4.35 MIL/uL (ref 4.22–5.81)
RDW: 13.6 % (ref 11.5–15.5)
WBC: 6.4 10*3/uL (ref 4.0–10.5)

## 2010-11-16 ENCOUNTER — Ambulatory Visit (HOSPITAL_COMMUNITY): Payer: Medicare Other

## 2010-11-17 ENCOUNTER — Other Ambulatory Visit (HOSPITAL_COMMUNITY): Payer: Self-pay | Admitting: Oncology

## 2010-11-17 ENCOUNTER — Encounter (HOSPITAL_COMMUNITY): Payer: Medicare Other | Attending: Oncology

## 2010-11-17 DIAGNOSIS — C61 Malignant neoplasm of prostate: Secondary | ICD-10-CM

## 2010-11-17 DIAGNOSIS — Z79899 Other long term (current) drug therapy: Secondary | ICD-10-CM | POA: Insufficient documentation

## 2010-11-17 LAB — CBC
HCT: 35.3 % — ABNORMAL LOW (ref 39.0–52.0)
Hemoglobin: 11.6 g/dL — ABNORMAL LOW (ref 13.0–17.0)
MCH: 29.7 pg (ref 26.0–34.0)
MCHC: 32.9 g/dL (ref 30.0–36.0)
MCV: 90.3 fL (ref 78.0–100.0)
Platelets: 285 10*3/uL (ref 150–400)
RBC: 3.91 MIL/uL — ABNORMAL LOW (ref 4.22–5.81)
RDW: 13.5 % (ref 11.5–15.5)
WBC: 6.8 10*3/uL (ref 4.0–10.5)

## 2010-11-17 LAB — COMPREHENSIVE METABOLIC PANEL
ALT: 14 U/L (ref 0–53)
AST: 22 U/L (ref 0–37)
Albumin: 4.2 g/dL (ref 3.5–5.2)
Alkaline Phosphatase: 52 U/L (ref 39–117)
BUN: 12 mg/dL (ref 6–23)
CO2: 27 mEq/L (ref 19–32)
Calcium: 8.7 mg/dL (ref 8.4–10.5)
Chloride: 104 mEq/L (ref 96–112)
Creatinine, Ser: 0.95 mg/dL (ref 0.50–1.35)
GFR calc Af Amer: >60 mL/min (ref >60)
GFR calc non Af Amer: >60 mL/min (ref >60)
Glucose, Bld: 78 mg/dL (ref 70–99)
Potassium: 3.8 mEq/L (ref 3.5–5.1)
Sodium: 138 mEq/L (ref 135–145)
Total Bilirubin: 0.3 mg/dL (ref 0.3–1.2)
Total Protein: 7.2 g/dL (ref 6.0–8.3)

## 2010-11-17 LAB — DIFFERENTIAL
Basophils Absolute: 0.1 10*3/uL (ref 0.0–0.1)
Basophils Relative: 1 % (ref 0–1)
Eosinophils Absolute: 0.2 10*3/uL (ref 0.0–0.7)
Eosinophils Relative: 3 % (ref 0–5)
Lymphocytes Relative: 34 % (ref 12–46)
Lymphs Abs: 2.3 10*3/uL (ref 0.7–4.0)
Monocytes Absolute: 0.6 10*3/uL (ref 0.1–1.0)
Monocytes Relative: 9 % (ref 3–12)
Neutro Abs: 3.7 10*3/uL (ref 1.7–7.7)
Neutrophils Relative %: 54 % (ref 43–77)

## 2010-12-20 ENCOUNTER — Encounter (HOSPITAL_COMMUNITY): Payer: Medicare Other | Attending: Oncology

## 2010-12-20 DIAGNOSIS — C7951 Secondary malignant neoplasm of bone: Secondary | ICD-10-CM | POA: Insufficient documentation

## 2010-12-20 DIAGNOSIS — C61 Malignant neoplasm of prostate: Secondary | ICD-10-CM | POA: Insufficient documentation

## 2010-12-28 ENCOUNTER — Other Ambulatory Visit (HOSPITAL_COMMUNITY): Payer: Self-pay | Admitting: Oncology

## 2010-12-28 DIAGNOSIS — C61 Malignant neoplasm of prostate: Secondary | ICD-10-CM

## 2010-12-29 ENCOUNTER — Other Ambulatory Visit (HOSPITAL_COMMUNITY): Payer: Self-pay | Admitting: Oncology

## 2010-12-29 ENCOUNTER — Encounter (HOSPITAL_COMMUNITY): Payer: Self-pay | Admitting: Oncology

## 2010-12-29 ENCOUNTER — Encounter (HOSPITAL_BASED_OUTPATIENT_CLINIC_OR_DEPARTMENT_OTHER): Payer: Medicare Other

## 2010-12-29 DIAGNOSIS — C61 Malignant neoplasm of prostate: Secondary | ICD-10-CM

## 2010-12-29 DIAGNOSIS — C7951 Secondary malignant neoplasm of bone: Secondary | ICD-10-CM

## 2010-12-29 DIAGNOSIS — C772 Secondary and unspecified malignant neoplasm of intra-abdominal lymph nodes: Secondary | ICD-10-CM | POA: Insufficient documentation

## 2010-12-29 MED ORDER — LEUPROLIDE ACETATE (6 MONTH) 45 MG ~~LOC~~ KIT: 45.0000 mg | PACK | SUBCUTANEOUS | Status: DC

## 2010-12-29 MED ORDER — LEUPROLIDE ACETATE (4 MONTH) 30 MG IM KIT
45.0000 mg | PACK | Freq: Once | INTRAMUSCULAR | Status: DC
  Filled 2010-12-29: qty 60

## 2010-12-29 MED ORDER — DENOSUMAB 120 MG/1.7ML ~~LOC~~ SOLN
120.0000 mg | Freq: Once | SUBCUTANEOUS | Status: AC
  Administered 2010-12-29: 120 mg via SUBCUTANEOUS
  Filled 2010-12-29: qty 1.7

## 2010-12-29 MED ORDER — OXYCODONE-ACETAMINOPHEN 10-325 MG PO TABS: 1.0000 | ORAL_TABLET | Freq: Four times a day (QID) | ORAL | Status: AC | PRN

## 2010-12-29 MED ORDER — LEUPROLIDE ACETATE (6 MONTH) 45 MG ~~LOC~~ KIT
45.0000 mg | PACK | Freq: Once | SUBCUTANEOUS | Status: DC
  Filled 2010-12-29: qty 45

## 2010-12-29 MED ORDER — OXYCODONE HCL 20 MG PO TB12
20.0000 mg | ORAL_TABLET | Freq: Two times a day (BID) | ORAL | Status: AC
Start: 2010-12-29 — End: 2011-01-29

## 2010-12-29 NOTE — Progress Notes (Signed)
Administered Eligard 45 mg subtutaneously to pt. Unable to document this on the [facility] because Lupron shows up instead.

## 2010-12-29 NOTE — Progress Notes (Signed)
Labs drawn today for psa 

## 2010-12-30 LAB — PSA: PSA: 0.01 ng/mL — ABNORMAL LOW (ref <=4.00)

## 2011-01-18 ENCOUNTER — Encounter: Payer: Self-pay | Admitting: Cardiology

## 2011-01-18 ENCOUNTER — Ambulatory Visit (INDEPENDENT_AMBULATORY_CARE_PROVIDER_SITE_OTHER): Payer: Medicare Other | Admitting: Cardiology

## 2011-01-18 ENCOUNTER — Other Ambulatory Visit: Payer: Self-pay | Admitting: Cardiology

## 2011-01-18 VITALS — BP 111/73 | HR 54 | Resp 18 | Ht 69.0 in | Wt 167.0 lb

## 2011-01-18 DIAGNOSIS — E782 Mixed hyperlipidemia: Secondary | ICD-10-CM

## 2011-01-18 DIAGNOSIS — I2510 Atherosclerotic heart disease of native coronary artery without angina pectoris: Secondary | ICD-10-CM

## 2011-01-18 NOTE — Assessment & Plan Note (Signed)
Followup lipids are pending. Will review.

## 2011-01-18 NOTE — Assessment & Plan Note (Signed)
Symptomatically stable, doing well on medical therapy. Continue diet and exercise, observation.

## 2011-01-18 NOTE — Patient Instructions (Signed)
**Note De-identified Kinsey Cowsert Obfuscation** Your physician recommends that you continue on your current medications as directed. Please refer to the Current Medication list given to you today.  Your physician recommends that you schedule a follow-up appointment in: 6 months  

## 2011-01-18 NOTE — Progress Notes (Signed)
Clinical Summary [name] [name] is a 55 y.o.male presenting for followup. He was seen in March of this year. He reports no significant chest pain or progress shortness of breath. Stays active by walking, also doing some yard work. He reports compliance with his medications. He had followup lipids this morning, results pending.  He states he has not had any further hematochezia. Hemoglobin back in January was 10.8, up to 12.7 in May, and more recently 11.6 in June.  Allergies  Allergen Reactions  . Zoledronic Acid     Medication list reviewed.  Past Medical History  Diagnosis Date  . Coronary artery disease     DES to LAD 1/12  . COPD (chronic obstructive pulmonary disease)   . Sigmoid diverticulosis   . DDD (degenerative disc disease)   . Anxiety   . Depression   . Psoriasis   . Tubular adenoma     Colonoscopy 1/12  . Horseshoe kidney     Single kidney  . Prostate cancer     Metastatic, stage IV  - Dr. Mariel Sleet  . Internal hemorrhoids     Colonoscopy 1/12  . Drug reaction     Pamidronate - induced skin toxicity, Zometa - induced toxicity  . GERD (gastroesophageal reflux disease)   . Gastritis   . Prostate ca 12/29/2010  . Bone metastases 12/29/2010    Past Surgical History  Procedure Date  . Anterior fusion cervical spine   . Posterior fusion cervical spine   . Left foot surgery   . Prostatectomy   . Appendectomy     Family History  Problem Relation Age of Onset  . Alzheimer's disease Mother   . Lung cancer Father   . Heart attack Brother     Social History [name] [name] reports that he quit smoking about 3 years ago. His smoking use included Cigarettes. He has a 15 pack-year smoking history. He has never used smokeless tobacco. [name] [name] reports that he does not drink alcohol.  Review of Systems No palpitations or syncope. Stable appetite. Otherwise negative.  Physical Examination Filed Vitals:   01/18/11 1312  BP: 111/73  Pulse: 54  Resp: 18    Chronically ill-appearing male in no acute distress.  HEENT: Conjunctiva and lids normal, oropharynx with poor dentition.  Neck: Supple, no elevated JVP or loud bruits, no thyromegaly.  Lungs: Diminished breath sounds, nonlabored, no wheezing.  Cardiac: Regular rate and rhythm, soft systolic murmur at the base, but her second heart sound, no S3.  Abdomen: Soft, nontender, bowel sounds present.  Skin: Warm and dry, scattered ecchymoses particularly in the forearms.  Musculoskeletal: No kyphosis.  Extremities: No pitting edema, distal pulses one plus.  Neuropsychiatric: Alert and oriented x3, affect appropriate.   ECG Sinus bradycardia at 54 beats per minute.   Problem List and Plan

## 2011-01-19 LAB — LIPID PANEL
Cholesterol: 188 mg/dL (ref 0–200)
HDL: 46 mg/dL (ref >39)
LDL Cholesterol: 94 mg/dL (ref 0–99)
Total CHOL/HDL Ratio: 4.1 Ratio
Triglycerides: 240 mg/dL — ABNORMAL HIGH (ref <150)
VLDL: 48 mg/dL — ABNORMAL HIGH (ref 0–40)

## 2011-01-19 LAB — HEPATIC FUNCTION PANEL
ALT: 10 U/L (ref 0–53)
AST: 18 U/L (ref 0–37)
Albumin: 4.6 g/dL (ref 3.5–5.2)
Alkaline Phosphatase: 51 U/L (ref 39–117)
Bilirubin, Direct: 0.1 mg/dL (ref 0.0–0.3)
Indirect Bilirubin: 0.3 mg/dL (ref 0.0–0.9)
Total Bilirubin: 0.4 mg/dL (ref 0.3–1.2)
Total Protein: 6.6 g/dL (ref 6.0–8.3)

## 2011-01-26 ENCOUNTER — Encounter (HOSPITAL_COMMUNITY): Payer: Medicare Other | Attending: Oncology

## 2011-01-26 DIAGNOSIS — C61 Malignant neoplasm of prostate: Secondary | ICD-10-CM | POA: Insufficient documentation

## 2011-01-26 DIAGNOSIS — C7951 Secondary malignant neoplasm of bone: Secondary | ICD-10-CM | POA: Insufficient documentation

## 2011-01-26 DIAGNOSIS — C7952 Secondary malignant neoplasm of bone marrow: Secondary | ICD-10-CM

## 2011-01-26 LAB — COMPREHENSIVE METABOLIC PANEL
ALT: 12 U/L (ref 0–53)
AST: 20 U/L (ref 0–37)
Albumin: 4.3 g/dL (ref 3.5–5.2)
Alkaline Phosphatase: 67 U/L (ref 39–117)
BUN: 7 mg/dL (ref 6–23)
CO2: 29 mEq/L (ref 19–32)
Calcium: 9 mg/dL (ref 8.4–10.5)
Chloride: 97 mEq/L (ref 96–112)
Creatinine, Ser: 0.83 mg/dL (ref 0.50–1.35)
GFR calc Af Amer: >60 mL/min (ref >60)
GFR calc non Af Amer: >60 mL/min (ref >60)
Glucose, Bld: 99 mg/dL (ref 70–99)
Potassium: 3 mEq/L — ABNORMAL LOW (ref 3.5–5.1)
Sodium: 137 mEq/L (ref 135–145)
Total Bilirubin: 0.6 mg/dL (ref 0.3–1.2)
Total Protein: 7.6 g/dL (ref 6.0–8.3)

## 2011-01-26 LAB — CBC
HCT: 35.6 % — ABNORMAL LOW (ref 39.0–52.0)
Hemoglobin: 12 g/dL — ABNORMAL LOW (ref 13.0–17.0)
MCH: 29.6 pg (ref 26.0–34.0)
MCHC: 33.7 g/dL (ref 30.0–36.0)
MCV: 87.7 fL (ref 78.0–100.0)
Platelets: 284 10*3/uL (ref 150–400)
RBC: 4.06 MIL/uL — ABNORMAL LOW (ref 4.22–5.81)
RDW: 13.7 % (ref 11.5–15.5)
WBC: 7.8 10*3/uL (ref 4.0–10.5)

## 2011-01-26 LAB — DIFFERENTIAL
Basophils Absolute: 0 10*3/uL (ref 0.0–0.1)
Basophils Relative: 0 % (ref 0–1)
Eosinophils Absolute: 0.2 10*3/uL (ref 0.0–0.7)
Eosinophils Relative: 2 % (ref 0–5)
Lymphocytes Relative: 24 % (ref 12–46)
Lymphs Abs: 1.9 10*3/uL (ref 0.7–4.0)
Monocytes Absolute: 0.9 10*3/uL (ref 0.1–1.0)
Monocytes Relative: 11 % (ref 3–12)
Neutro Abs: 4.9 10*3/uL (ref 1.7–7.7)
Neutrophils Relative %: 62 % (ref 43–77)

## 2011-01-26 MED ORDER — DENOSUMAB 120 MG/1.7ML ~~LOC~~ SOLN
120.0000 mg | Freq: Once | SUBCUTANEOUS | Status: AC
  Administered 2011-01-26: 120 mg via SUBCUTANEOUS
  Filled 2011-01-26: qty 1.7

## 2011-01-26 NOTE — Progress Notes (Signed)
[name] [name] presented for labwork. Labs per MD order drawn via Peripheral Line 24 gauge needle inserted in rt ac Good blood return present. Procedure without incident.  Needle removed intact. Patient tolerated procedure well.  [name] [name] presents today for injection per MD orders. Denosumab 120mg  administered SQ in right Abdomen. Administration without incident. Patient tolerated well.

## 2011-01-26 NOTE — Progress Notes (Signed)
Dr. Jodene Nam said it was ok to administer denosumab without getting cmet results back.

## 2011-01-29 ENCOUNTER — Other Ambulatory Visit (HOSPITAL_COMMUNITY): Payer: Self-pay | Admitting: *Deleted

## 2011-01-29 ENCOUNTER — Telehealth (HOSPITAL_COMMUNITY): Payer: Self-pay | Admitting: *Deleted

## 2011-01-29 DIAGNOSIS — E876 Hypokalemia: Secondary | ICD-10-CM

## 2011-01-29 NOTE — Telephone Encounter (Signed)
Message copied by Dennie Maizes on Mon Jan 29, 2011 12:52 PM ------      Message from: Mariel Sleet, ERIC S      Created: Mon Jan 29, 2011 10:11 AM       I did not see a diuretic on his med list, so call in K-dur # 100 and take one bid until gone      K+ in 8 weeks

## 2011-01-29 NOTE — Telephone Encounter (Signed)
Spoke with pt. kdur called to [employer].

## 2011-02-23 LAB — COMPREHENSIVE METABOLIC PANEL
ALT: 67 — ABNORMAL HIGH
AST: 68 — ABNORMAL HIGH
Albumin: 4.1
Alkaline Phosphatase: 89
BUN: 9
CO2: 25
Calcium: 9.3
Chloride: 107
Creatinine, Ser: 1.06
GFR calc Af Amer: >60
GFR calc non Af Amer: >60
Glucose, Bld: 132 — ABNORMAL HIGH
Potassium: 3.9
Sodium: 140
Total Bilirubin: 0.5
Total Protein: 7.1

## 2011-02-23 LAB — CBC
HCT: 39.5
Hemoglobin: 13.9
MCHC: 35.1
MCV: 90.7
Platelets: 374
RBC: 4.36
RDW: 13.1
WBC: 9.8

## 2011-02-23 LAB — PSA: PSA: 0.01 — ABNORMAL LOW

## 2011-02-26 ENCOUNTER — Ambulatory Visit (HOSPITAL_COMMUNITY): Payer: Medicare Other

## 2011-02-26 LAB — HEPATIC FUNCTION PANEL
ALT: 19
AST: 21
Albumin: 4.2
Alkaline Phosphatase: 84
Bilirubin, Direct: 0.1
Indirect Bilirubin: 0.8
Total Bilirubin: 0.9
Total Protein: 7.3

## 2011-03-01 ENCOUNTER — Encounter (HOSPITAL_COMMUNITY): Payer: Medicare Other | Attending: Oncology

## 2011-03-01 ENCOUNTER — Other Ambulatory Visit (HOSPITAL_COMMUNITY): Payer: Self-pay | Admitting: Oncology

## 2011-03-01 DIAGNOSIS — C7951 Secondary malignant neoplasm of bone: Secondary | ICD-10-CM

## 2011-03-01 DIAGNOSIS — C61 Malignant neoplasm of prostate: Secondary | ICD-10-CM

## 2011-03-01 DIAGNOSIS — F419 Anxiety disorder, unspecified: Secondary | ICD-10-CM

## 2011-03-01 MED ORDER — OXYCODONE HCL 20 MG PO TB12: 20.0000 mg | ORAL_TABLET | Freq: Two times a day (BID) | ORAL | Status: DC

## 2011-03-01 MED ORDER — OXYCODONE-ACETAMINOPHEN 10-325 MG PO TABS: 1.0000 | ORAL_TABLET | Freq: Four times a day (QID) | ORAL | Status: DC | PRN

## 2011-03-01 MED ORDER — ALPRAZOLAM 0.5 MG PO TABS: 0.5000 mg | ORAL_TABLET | Freq: Three times a day (TID) | ORAL | Status: DC

## 2011-03-01 MED ORDER — DENOSUMAB 120 MG/1.7ML ~~LOC~~ SOLN
120.0000 mg | Freq: Once | SUBCUTANEOUS | Status: AC
  Administered 2011-03-01: 120 mg via SUBCUTANEOUS
  Filled 2011-03-01: qty 1.7

## 2011-03-05 LAB — CBC
HCT: 41
Hemoglobin: 13.9
MCHC: 33.9
MCV: 92.1
Platelets: 324
RBC: 4.46
RDW: 13.6
WBC: 10.6 — ABNORMAL HIGH

## 2011-03-05 LAB — COMPREHENSIVE METABOLIC PANEL
ALT: 16
AST: 19
Albumin: 4.6
Alkaline Phosphatase: 71
BUN: 12
CO2: 26
Calcium: 9.5
Chloride: 106
Creatinine, Ser: 0.93
GFR calc Af Amer: >60
GFR calc non Af Amer: >60
Glucose, Bld: 93
Potassium: 4
Sodium: 138
Total Bilirubin: 0.8
Total Protein: 7.5

## 2011-03-05 LAB — PSA: PSA: <0.01 — ABNORMAL LOW

## 2011-03-09 LAB — FREE PSA: PSA, Free: <0.1 ng/mL

## 2011-03-09 LAB — PSA: PSA: <0.01 ng/mL — ABNORMAL LOW (ref 0.10–4.00)

## 2011-03-13 LAB — CBC
HCT: 38 — ABNORMAL LOW
Hemoglobin: 13.1
MCHC: 34.5
MCV: 92.3
Platelets: 293
RBC: 4.12 — ABNORMAL LOW
RDW: 12.9
WBC: 6.8

## 2011-03-13 LAB — COMPREHENSIVE METABOLIC PANEL
ALT: 17
AST: 24
Albumin: 4
Alkaline Phosphatase: 75
BUN: 12
CO2: 25
Calcium: 9.1
Chloride: 106
Creatinine, Ser: 0.96
GFR calc Af Amer: >60
GFR calc non Af Amer: >60
Glucose, Bld: 100 — ABNORMAL HIGH
Potassium: 3.4 — ABNORMAL LOW
Sodium: 136
Total Bilirubin: 0.6
Total Protein: 6.9

## 2011-03-13 LAB — LIPID PANEL
Cholesterol: 206 — ABNORMAL HIGH
HDL: 45
LDL Cholesterol: 124 — ABNORMAL HIGH
Total CHOL/HDL Ratio: 4.6
Triglycerides: 186 — ABNORMAL HIGH
VLDL: 37

## 2011-03-13 LAB — PSA: PSA: <0.01 — ABNORMAL LOW

## 2011-03-15 LAB — CBC
HCT: 41.4
Hemoglobin: 14.1
MCHC: 34.1
MCV: 94.1
Platelets: 295
RBC: 4.4
RDW: 12.3
WBC: 8.1

## 2011-03-19 LAB — PSA: PSA: <0.01 — ABNORMAL LOW

## 2011-03-22 LAB — PSA: PSA: <0.01 — ABNORMAL LOW

## 2011-03-23 ENCOUNTER — Ambulatory Visit (HOSPITAL_COMMUNITY): Payer: Medicare Other

## 2011-03-29 ENCOUNTER — Other Ambulatory Visit (HOSPITAL_COMMUNITY): Payer: Self-pay | Admitting: Oncology

## 2011-03-29 ENCOUNTER — Encounter (HOSPITAL_COMMUNITY): Payer: Medicare Other | Attending: Oncology

## 2011-03-29 DIAGNOSIS — C61 Malignant neoplasm of prostate: Secondary | ICD-10-CM | POA: Insufficient documentation

## 2011-03-29 DIAGNOSIS — E876 Hypokalemia: Secondary | ICD-10-CM | POA: Insufficient documentation

## 2011-03-29 DIAGNOSIS — C7951 Secondary malignant neoplasm of bone: Secondary | ICD-10-CM

## 2011-03-29 DIAGNOSIS — C7952 Secondary malignant neoplasm of bone marrow: Secondary | ICD-10-CM

## 2011-03-29 MED ORDER — OXYCODONE HCL 20 MG PO TB12: 20.0000 mg | ORAL_TABLET | Freq: Two times a day (BID) | ORAL | Status: DC

## 2011-03-29 MED ORDER — OXYCODONE-ACETAMINOPHEN 10-325 MG PO TABS: 1.0000 | ORAL_TABLET | Freq: Four times a day (QID) | ORAL | Status: DC | PRN

## 2011-03-29 MED ORDER — DENOSUMAB 120 MG/1.7ML ~~LOC~~ SOLN
120.0000 mg | Freq: Once | SUBCUTANEOUS | Status: AC
  Administered 2011-03-29: 120 mg via SUBCUTANEOUS
  Filled 2011-03-29: qty 1.7

## 2011-03-29 NOTE — Progress Notes (Signed)
[name] [name] presents today for injection per MD orders. Denosumab administered SQ in left Abdomen. Administration without incident. Patient tolerated well.

## 2011-04-01 ENCOUNTER — Encounter (HOSPITAL_COMMUNITY): Payer: Self-pay

## 2011-04-02 ENCOUNTER — Encounter (HOSPITAL_BASED_OUTPATIENT_CLINIC_OR_DEPARTMENT_OTHER): Payer: Medicare Other | Admitting: Oncology

## 2011-04-02 ENCOUNTER — Encounter (HOSPITAL_COMMUNITY): Payer: Medicare Other

## 2011-04-02 ENCOUNTER — Encounter (HOSPITAL_COMMUNITY): Payer: Self-pay | Admitting: Oncology

## 2011-04-02 VITALS — BP 123/76 | HR 76 | Temp 97.8°F | Wt 155.0 lb

## 2011-04-02 DIAGNOSIS — M810 Age-related osteoporosis without current pathological fracture: Secondary | ICD-10-CM

## 2011-04-02 DIAGNOSIS — C61 Malignant neoplasm of prostate: Secondary | ICD-10-CM

## 2011-04-02 DIAGNOSIS — E876 Hypokalemia: Secondary | ICD-10-CM

## 2011-04-02 LAB — COMPREHENSIVE METABOLIC PANEL
ALT: 10 U/L (ref 0–53)
AST: 19 U/L (ref 0–37)
Albumin: 3.8 g/dL (ref 3.5–5.2)
Alkaline Phosphatase: 73 U/L (ref 39–117)
BUN: 11 mg/dL (ref 6–23)
CO2: 28 mEq/L (ref 19–32)
Calcium: 9.3 mg/dL (ref 8.4–10.5)
Chloride: 103 mEq/L (ref 96–112)
Creatinine, Ser: 0.94 mg/dL (ref 0.50–1.35)
GFR calc Af Amer: >90 mL/min (ref >90)
GFR calc non Af Amer: >90 mL/min (ref >90)
Glucose, Bld: 87 mg/dL (ref 70–99)
Potassium: 4 mEq/L (ref 3.5–5.1)
Sodium: 139 mEq/L (ref 135–145)
Total Bilirubin: 0.2 mg/dL — ABNORMAL LOW (ref 0.3–1.2)
Total Protein: 6.5 g/dL (ref 6.0–8.3)

## 2011-04-02 LAB — CBC
HCT: 34.2 % — ABNORMAL LOW (ref 39.0–52.0)
Hemoglobin: 11.5 g/dL — ABNORMAL LOW (ref 13.0–17.0)
MCH: 30.7 pg (ref 26.0–34.0)
MCHC: 33.6 g/dL (ref 30.0–36.0)
MCV: 91.2 fL (ref 78.0–100.0)
Platelets: 284 10*3/uL (ref 150–400)
RBC: 3.75 MIL/uL — ABNORMAL LOW (ref 4.22–5.81)
RDW: 14.5 % (ref 11.5–15.5)
WBC: 7.5 10*3/uL (ref 4.0–10.5)

## 2011-04-02 NOTE — Progress Notes (Signed)
This office note has been dictated.

## 2011-04-02 NOTE — Patient Instructions (Signed)
[facility]  Discharge Instructions  RECOMMENDATIONS MADE BY THE CONSULTANT AND ANY TEST RESULTS WILL BE SENT TO YOUR REFERRING DOCTOR.   EXAM FINDINGS BY MD TODAY AND SIGNS AND SYMPTOMS TO REPORT TO CLINIC OR PRIMARY MD: you are doing well.  Need to check labs today.  MEDICATIONS PRESCRIBED: none   INSTRUCTIONS GIVEN AND DISCUSSED: Report uncontrolled pain.  SPECIAL INSTRUCTIONS/FOLLOW-UP: Other (Referral/Appointments) See schedule.   I acknowledge that I have been informed and understand all the instructions given to me and received a copy. I do not have any more questions at this time, but understand that I may call the [facility]ty [facility] at [facility] at [phone] during business hours should I have any further questions or need assistance in obtaining follow-up care.    __________________________________________  _____________  __________ Signature of Patient or Authorized Representative            Date                   Time    __________________________________________ Nurse's Signature

## 2011-04-03 ENCOUNTER — Telehealth (HOSPITAL_COMMUNITY): Payer: Self-pay

## 2011-04-03 LAB — PSA: PSA: 0.01 ng/mL — ABNORMAL LOW (ref <=4.00)

## 2011-04-03 NOTE — Telephone Encounter (Signed)
Message left for patient to call clinic to discuss appointments.

## 2011-04-04 ENCOUNTER — Other Ambulatory Visit (HOSPITAL_COMMUNITY): Payer: Self-pay | Admitting: Oncology

## 2011-04-04 DIAGNOSIS — C61 Malignant neoplasm of prostate: Secondary | ICD-10-CM

## 2011-04-13 ENCOUNTER — Other Ambulatory Visit (HOSPITAL_COMMUNITY): Payer: Self-pay

## 2011-04-16 ENCOUNTER — Encounter (HOSPITAL_COMMUNITY): Payer: Medicare Other | Attending: Oncology

## 2011-04-16 DIAGNOSIS — E876 Hypokalemia: Secondary | ICD-10-CM | POA: Insufficient documentation

## 2011-04-16 DIAGNOSIS — C61 Malignant neoplasm of prostate: Secondary | ICD-10-CM | POA: Insufficient documentation

## 2011-04-16 DIAGNOSIS — C7952 Secondary malignant neoplasm of bone marrow: Secondary | ICD-10-CM | POA: Insufficient documentation

## 2011-04-16 DIAGNOSIS — C7951 Secondary malignant neoplasm of bone: Secondary | ICD-10-CM | POA: Insufficient documentation

## 2011-04-16 LAB — BASIC METABOLIC PANEL
BUN: 13 mg/dL (ref 6–23)
CO2: 26 mEq/L (ref 19–32)
Calcium: 9.2 mg/dL (ref 8.4–10.5)
Chloride: 102 mEq/L (ref 96–112)
Creatinine, Ser: 0.96 mg/dL (ref 0.50–1.35)
GFR calc Af Amer: >90 mL/min (ref >90)
GFR calc non Af Amer: >90 mL/min (ref >90)
Glucose, Bld: 90 mg/dL (ref 70–99)
Potassium: 3.7 mEq/L (ref 3.5–5.1)
Sodium: 136 mEq/L (ref 135–145)

## 2011-04-16 NOTE — Progress Notes (Signed)
Labs drawn today for bmp 

## 2011-04-30 ENCOUNTER — Encounter (HOSPITAL_BASED_OUTPATIENT_CLINIC_OR_DEPARTMENT_OTHER): Payer: Medicare Other

## 2011-04-30 DIAGNOSIS — C7951 Secondary malignant neoplasm of bone: Secondary | ICD-10-CM

## 2011-04-30 DIAGNOSIS — Z23 Encounter for immunization: Secondary | ICD-10-CM

## 2011-04-30 DIAGNOSIS — C61 Malignant neoplasm of prostate: Secondary | ICD-10-CM

## 2011-04-30 MED ORDER — DENOSUMAB 120 MG/1.7ML ~~LOC~~ SOLN
120.0000 mg | Freq: Once | SUBCUTANEOUS | Status: AC
  Administered 2011-04-30: 120 mg via SUBCUTANEOUS
  Filled 2011-04-30: qty 1.7

## 2011-04-30 MED ORDER — INFLUENZA VIRUS VACC SPLIT PF IM SUSP
INTRAMUSCULAR | Status: AC
  Filled 2011-04-30: qty 0.5

## 2011-04-30 MED ORDER — INFLUENZA VIRUS VACC SPLIT PF IM SUSP
0.5000 mL | INTRAMUSCULAR | Status: AC
  Administered 2011-04-30: 0.5 mL via INTRAMUSCULAR

## 2011-04-30 NOTE — Progress Notes (Signed)
CC:   [name], M.D. [name]. [name], M.D. [name], MD  DIAGNOSES: 1. 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  He is status post surgery, namely a radical     prostatectomy by Dr. Margo Aye on 02/28/2006 and it was at that time     that he was found to have lymph node involvement.  His bone scan     was negative.  He has been on Depot Lupron in the past and most     recently Eligard 45 mg every 6 months. 2. Osteoporosis and degenerative disk and joint disease of the neck     and back, status post C5-C6 and C6-C7 fusion and occasional     epidural steroid injections by Dr. [name]. 3. Anxiety and depression which are improved. 4. Weakness and fatigue possibly from his medications including     metoprolol for his heart. 5. History of stent placement for his heart disease which took place     recently.  He continues to see his cardiologist.  He actually had     unstable angina prompting this stent placement February 2012.     Procedure date itself for cardiac catheterization was 06/28/2010. He is still getting his denosumab on a regular basis, doing very well with it.  He tells me today that he and his wife have split up after 20 years of marriage.  They live [place] the same [place] so the kids go back and forth readily and he continues to have contact with them.  It appears that it is an amicable separation.  He does not look depressed necessarily but he states he has no interest anymore of course sexually, etc.  His vital signs show his weight has gone from 168 pounds in April to 155 pounds today.  He does look slightly thinner.  He does not admit to being depressed really.  He is in no acute distress.  He is not having any new pains or different pains.  He states the steroid injections still help quite a bit when he gets them and he had one recently, he states.  Blood pressure is 123/76 today left arm sitting position, pulse 76  and regular, respirations 18 and unlabored.  He is afebrile.  He has no peripheral edema.  He has an abdomen which is soft and nontender, without organomegaly.  Bowel sounds are normal.  His heart shows a regular rhythm and rate and I did not appreciate a murmur or gallop today.  His lungs are clear.  He has no adenopathy.  I think he is doing well but we are going to continue his Eligard, his denosumab and his labs.  His potassium was low last time he was here. We are going to repeat it today and see where we stand.  He is not on any active potassium presently.  I think he has done remarkably well with stage IV disease since 2007.   ______________________________ Ladona Horns. Mariel Sleet, MD ESN/MEDQ  D:  04/02/2011  T:  04/02/2011  Job:  [id number]

## 2011-04-30 NOTE — Progress Notes (Signed)
[name] [name] presents today for injection per MD orders. Denosumab administered SQ in left Abdomen.  Flu Vaccine administered IN in left arm.   Administration without incident. Patient tolerated well.

## 2011-05-30 ENCOUNTER — Encounter (HOSPITAL_COMMUNITY): Payer: Medicare Other | Attending: Oncology

## 2011-05-30 DIAGNOSIS — C7952 Secondary malignant neoplasm of bone marrow: Secondary | ICD-10-CM

## 2011-05-30 DIAGNOSIS — C61 Malignant neoplasm of prostate: Secondary | ICD-10-CM | POA: Insufficient documentation

## 2011-05-30 DIAGNOSIS — E876 Hypokalemia: Secondary | ICD-10-CM

## 2011-05-30 DIAGNOSIS — C7951 Secondary malignant neoplasm of bone: Secondary | ICD-10-CM

## 2011-05-30 LAB — CBC
HCT: 36.3 % — ABNORMAL LOW (ref 39.0–52.0)
Hemoglobin: 11.8 g/dL — ABNORMAL LOW (ref 13.0–17.0)
MCH: 30.3 pg (ref 26.0–34.0)
MCHC: 32.5 g/dL (ref 30.0–36.0)
MCV: 93.1 fL (ref 78.0–100.0)
Platelets: 288 10*3/uL (ref 150–400)
RBC: 3.9 MIL/uL — ABNORMAL LOW (ref 4.22–5.81)
RDW: 13.9 % (ref 11.5–15.5)
WBC: 7 10*3/uL (ref 4.0–10.5)

## 2011-05-30 LAB — COMPREHENSIVE METABOLIC PANEL
ALT: 12 U/L (ref 0–53)
AST: 17 U/L (ref 0–37)
Albumin: 4 g/dL (ref 3.5–5.2)
Alkaline Phosphatase: 52 U/L (ref 39–117)
BUN: 11 mg/dL (ref 6–23)
CO2: 28 mEq/L (ref 19–32)
Calcium: 9.1 mg/dL (ref 8.4–10.5)
Chloride: 103 mEq/L (ref 96–112)
Creatinine, Ser: 1.02 mg/dL (ref 0.50–1.35)
GFR calc Af Amer: >90 mL/min (ref >90)
GFR calc non Af Amer: 80 mL/min — ABNORMAL LOW (ref >90)
Glucose, Bld: 87 mg/dL (ref 70–99)
Potassium: 4 mEq/L (ref 3.5–5.1)
Sodium: 140 mEq/L (ref 135–145)
Total Bilirubin: 0.2 mg/dL — ABNORMAL LOW (ref 0.3–1.2)
Total Protein: 7 g/dL (ref 6.0–8.3)

## 2011-05-30 MED ORDER — DENOSUMAB 120 MG/1.7ML ~~LOC~~ SOLN
120.0000 mg | Freq: Once | SUBCUTANEOUS | Status: AC
  Administered 2011-05-30: 120 mg via SUBCUTANEOUS
  Filled 2011-05-30: qty 1.7

## 2011-05-30 NOTE — Progress Notes (Signed)
[name] [name] presented for labwork. Labs per MD order drawn via Peripheral Line 23 gauge needle inserted in left antecubital.  Good blood return present. Labs drawn for cbc, cmet and psa. Procedure without incident.  Needle removed intact. Patient tolerated procedure well.  [name] [name] presents today for injection per MD orders. Xgeva administered SQ in right Abdomen. Administration without incident. Patient tolerated well.

## 2011-05-31 LAB — PSA: PSA: <0.01 ng/mL — ABNORMAL LOW (ref <=4.00)

## 2011-06-12 ENCOUNTER — Other Ambulatory Visit (HOSPITAL_COMMUNITY): Payer: Self-pay | Admitting: Oncology

## 2011-06-12 ENCOUNTER — Encounter (HOSPITAL_COMMUNITY): Payer: Medicare Other | Attending: Oncology

## 2011-06-12 DIAGNOSIS — E876 Hypokalemia: Secondary | ICD-10-CM | POA: Insufficient documentation

## 2011-06-12 DIAGNOSIS — Z5111 Encounter for antineoplastic chemotherapy: Secondary | ICD-10-CM

## 2011-06-12 DIAGNOSIS — C61 Malignant neoplasm of prostate: Secondary | ICD-10-CM | POA: Insufficient documentation

## 2011-06-12 DIAGNOSIS — C7952 Secondary malignant neoplasm of bone marrow: Secondary | ICD-10-CM | POA: Insufficient documentation

## 2011-06-12 DIAGNOSIS — C801 Malignant (primary) neoplasm, unspecified: Secondary | ICD-10-CM | POA: Insufficient documentation

## 2011-06-12 DIAGNOSIS — C7951 Secondary malignant neoplasm of bone: Secondary | ICD-10-CM | POA: Insufficient documentation

## 2011-06-12 DIAGNOSIS — F411 Generalized anxiety disorder: Secondary | ICD-10-CM | POA: Insufficient documentation

## 2011-06-12 MED ORDER — LEUPROLIDE ACETATE (6 MONTH) 45 MG ~~LOC~~ KIT
45.0000 mg | PACK | SUBCUTANEOUS | Status: DC
  Administered 2011-06-12: 45 mg via SUBCUTANEOUS
  Filled 2011-06-12: qty 45

## 2011-06-12 NOTE — Progress Notes (Signed)
Tolerated injection well. 

## 2011-06-25 ENCOUNTER — Encounter (HOSPITAL_BASED_OUTPATIENT_CLINIC_OR_DEPARTMENT_OTHER): Payer: Medicare Other

## 2011-06-25 DIAGNOSIS — C61 Malignant neoplasm of prostate: Secondary | ICD-10-CM

## 2011-06-25 DIAGNOSIS — E876 Hypokalemia: Secondary | ICD-10-CM

## 2011-06-25 LAB — COMPREHENSIVE METABOLIC PANEL
ALT: 11 U/L (ref 0–53)
AST: 18 U/L (ref 0–37)
Albumin: 4 g/dL (ref 3.5–5.2)
Alkaline Phosphatase: 55 U/L (ref 39–117)
BUN: 10 mg/dL (ref 6–23)
CO2: 28 mEq/L (ref 19–32)
Calcium: 9.4 mg/dL (ref 8.4–10.5)
Chloride: 104 mEq/L (ref 96–112)
Creatinine, Ser: 1.06 mg/dL (ref 0.50–1.35)
GFR calc Af Amer: 88 mL/min — ABNORMAL LOW (ref >90)
GFR calc non Af Amer: 76 mL/min — ABNORMAL LOW (ref >90)
Glucose, Bld: 104 mg/dL — ABNORMAL HIGH (ref 70–99)
Potassium: 4.1 mEq/L (ref 3.5–5.1)
Sodium: 139 mEq/L (ref 135–145)
Total Bilirubin: 0.4 mg/dL (ref 0.3–1.2)
Total Protein: 6.8 g/dL (ref 6.0–8.3)

## 2011-06-25 LAB — CBC
HCT: 35.7 % — ABNORMAL LOW (ref 39.0–52.0)
Hemoglobin: 12.1 g/dL — ABNORMAL LOW (ref 13.0–17.0)
MCH: 30.9 pg (ref 26.0–34.0)
MCHC: 33.9 g/dL (ref 30.0–36.0)
MCV: 91.1 fL (ref 78.0–100.0)
Platelets: 293 10*3/uL (ref 150–400)
RBC: 3.92 MIL/uL — ABNORMAL LOW (ref 4.22–5.81)
RDW: 13.5 % (ref 11.5–15.5)
WBC: 6.6 10*3/uL (ref 4.0–10.5)

## 2011-06-25 LAB — PSA: PSA: <0.01 ng/mL — ABNORMAL LOW

## 2011-06-25 NOTE — Progress Notes (Signed)
Labs drawn today for cbc/diff,cmp,psa 

## 2011-06-26 ENCOUNTER — Telehealth (HOSPITAL_COMMUNITY): Payer: Self-pay

## 2011-06-26 NOTE — Telephone Encounter (Signed)
Message left for patient

## 2011-06-26 NOTE — Telephone Encounter (Signed)
Message copied by Sterling Big on Tue Jun 26, 2011 12:28 PM ------      Message from: Mariel Sleet, ERIC S      Created: Tue Jun 26, 2011  9:25 AM       Randie Heinz call him

## 2011-06-28 ENCOUNTER — Encounter (HOSPITAL_BASED_OUTPATIENT_CLINIC_OR_DEPARTMENT_OTHER): Payer: Medicare Other

## 2011-06-28 DIAGNOSIS — F419 Anxiety disorder, unspecified: Secondary | ICD-10-CM

## 2011-06-28 DIAGNOSIS — C61 Malignant neoplasm of prostate: Secondary | ICD-10-CM

## 2011-06-28 DIAGNOSIS — C7951 Secondary malignant neoplasm of bone: Secondary | ICD-10-CM

## 2011-06-28 DIAGNOSIS — C7952 Secondary malignant neoplasm of bone marrow: Secondary | ICD-10-CM

## 2011-06-28 MED ORDER — OXYCODONE-ACETAMINOPHEN 10-325 MG PO TABS: 1.0000 | ORAL_TABLET | Freq: Four times a day (QID) | ORAL | Status: DC | PRN

## 2011-06-28 MED ORDER — ALPRAZOLAM 0.5 MG PO TABS: 0.5000 mg | ORAL_TABLET | Freq: Three times a day (TID) | ORAL | Status: DC

## 2011-06-28 MED ORDER — DENOSUMAB 120 MG/1.7ML ~~LOC~~ SOLN
120.0000 mg | Freq: Once | SUBCUTANEOUS | Status: AC
  Administered 2011-06-28: 120 mg via SUBCUTANEOUS
  Filled 2011-06-28: qty 1.7

## 2011-06-28 MED ORDER — OXYCODONE HCL 20 MG PO TB12: 20.0000 mg | ORAL_TABLET | Freq: Two times a day (BID) | ORAL | Status: DC

## 2011-06-28 NOTE — Progress Notes (Signed)
[name] [name] presents today for injection per MD orders. Denosumab 120mg administered SQ in left Abdomen. Administration without incident. Patient tolerated well.  

## 2011-06-29 ENCOUNTER — Ambulatory Visit (HOSPITAL_COMMUNITY): Payer: Medicare Other

## 2011-07-17 ENCOUNTER — Ambulatory Visit (INDEPENDENT_AMBULATORY_CARE_PROVIDER_SITE_OTHER): Payer: Medicare Other | Admitting: Cardiology

## 2011-07-17 ENCOUNTER — Encounter: Payer: Self-pay | Admitting: Cardiology

## 2011-07-17 VITALS — BP 144/88 | HR 74 | Resp 18 | Ht 69.0 in | Wt 170.0 lb

## 2011-07-17 DIAGNOSIS — I2510 Atherosclerotic heart disease of native coronary artery without angina pectoris: Secondary | ICD-10-CM

## 2011-07-17 DIAGNOSIS — E782 Mixed hyperlipidemia: Secondary | ICD-10-CM

## 2011-07-17 NOTE — Assessment & Plan Note (Signed)
Symptomatically stable on medical therapy, ECG is normal. Plan is to stop Plavix at this, one year out from DES to LAD, with prior documented anemia, bruising, and hematochezia. Would expect that he could proceed with lumbar injection without need for further cardiac testing. Followup arranged.

## 2011-07-17 NOTE — Assessment & Plan Note (Signed)
Will arrange FLP and LFT on statin therapy.

## 2011-07-17 NOTE — Progress Notes (Signed)
Clinical Summary [name] [name] is a 56 y.o.male presenting for followup. He was seen in August 2012. He is doing well, reports no chest pain or progressive dyspnea. He indicates compliance with his medications.  Recent CBC per Dr. Mariel Sleet showed Hgb 12.1, platelets 293. Other labwork in 1/13 showed AST 18, ALT 11. Lipid profile from last visit showed cholesterol 188, triglycerides 240, HDL 46, LDL 94.  He states that he is to have a lumbar injection by Dr. [name] with [facility], has had these previously. He has already stopped his Plavix on his own as of this week. We discussed Plavix and at this point plan is to stay off Plavix since he has completed a year after his DES, and has had easy bruising and prior hematochezia/anemia.  ECG shows normal sinus rhythm.   Allergies  Allergen Reactions  . Zoledronic Acid     ELEVATED BP, DIAPHORETIC & DIZZINESS    Current Outpatient Prescriptions  Medication Sig Dispense Refill  . ALBUTEROL IN Inhale into the lungs as needed.        . ALPRAZolam (XANAX) 0.5 MG tablet Take 1 tablet (0.5 mg total) by mouth 3 (three) times daily.  90 tablet  3  . aspirin EC 81 MG EC tablet Take 1 tablet (81 mg total) by mouth daily.  150 tablet  2  . calcipotriene (DOVONOX) 0.005 % cream Apply topically 2 (two) times daily.        . Calcium Carbonate-Vitamin D (CALTRATE 600+D PO) Take by mouth daily.        Marland Kitchen CAPEX 0.01 % SHAM       . CLOBEX SPRAY 0.05 % external spray       . clopidogrel (PLAVIX) 75 MG tablet Take 75 mg by mouth daily.        Marland Kitchen denosumab (PROLIA) 60 MG/ML SOLN Inject 60 mg into the skin as directed.        . gabapentin (NEURONTIN) 300 MG capsule Take 300 mg by mouth at bedtime. TAKE TWO TABLETS AT BEDTIME.       Marland Kitchen leuprolide (ELIGARD) 45 MG injection Inject 45 mg into the skin every 6 (six) months.  1 each  12  . leuprolide (LUPRON) 3.75 MG injection Inject 3.75 mg into the muscle as directed.        . metoprolol succinate (TOPROL-XL) 25 MG 24 hr  tablet Take 25 mg by mouth daily.        . nitroGLYCERIN (NITROSTAT) 0.4 MG SL tablet Place 0.4 mg under the tongue every 5 (five) minutes as needed. FOR CHEST PAIN.       Marland Kitchen oxyCODONE (OXYCONTIN) 20 MG 12 hr tablet Take 1 tablet (20 mg total) by mouth every 12 (twelve) hours.  68 tablet  0  . oxyCODONE-acetaminophen (PERCOCET) 10-325 MG per tablet Take 1 tablet by mouth every 6 (six) hours as needed for pain.  100 tablet  0  . pantoprazole (PROTONIX) 40 MG tablet Take 40 mg by mouth daily.        . pravastatin (PRAVACHOL) 40 MG tablet Take 40 mg by mouth daily.        . TACLONEX ointment       . VENTOLIN HFA 108 (90 BASE) MCG/ACT inhaler         Past Medical History  Diagnosis Date  . Coronary artery disease     DES to LAD 1/12  . COPD (chronic obstructive pulmonary disease)   . Sigmoid diverticulosis   . DDD (  degenerative disc disease)   . Anxiety   . Depression   . Psoriasis   . Tubular adenoma     Colonoscopy 1/12  . Horseshoe kidney     Single kidney  . Prostate cancer     Metastatic, stage IV  - Dr. Mariel Sleet  . Internal hemorrhoids     Colonoscopy 1/12  . Drug reaction     Pamidronate - induced skin toxicity, Zometa - induced toxicity  . GERD (gastroesophageal reflux disease)   . Gastritis   . Prostate ca 12/29/2010  . Bone metastases 12/29/2010  . Osteoarthritis     Social History [name] [name] reports that he quit smoking about 3 years ago. His smoking use included Cigarettes. He has a 15 pack-year smoking history. He has never used smokeless tobacco. [name] [name] reports that he does not drink alcohol.  Review of Systems No palpitations or syncope. No orthopnea or PND. Complains of chronic back pain. Otherwise negative.  Physical Examination Filed Vitals:   07/17/11 0921  BP: 144/88  Pulse: 74  Resp: 18   Chronically ill-appearing male in no acute distress.  HEENT: Conjunctiva and lids normal, oropharynx with poor dentition.  Neck: Supple, no elevated JVP or  loud bruits, no thyromegaly.  Lungs: Diminished breath sounds, nonlabored, no wheezing.  Cardiac: Regular rate and rhythm, soft systolic murmur at the base, but her second heart sound, no S3.  Abdomen: Soft, nontender, bowel sounds present.  Skin: Warm and dry, scattered ecchymoses. Musculoskeletal: No kyphosis.  Extremities: No pitting edema, distal pulses one plus. .    Problem List and Plan

## 2011-07-17 NOTE — Patient Instructions (Signed)
**Note De-identified Thaila [name] [name] Obfuscation** Your physician recommends that you continue on your current medications as directed. Please refer to the Current Medication list given to you today.  Your physician recommends that you return for lab work in: this week   Your physician recommends that you schedule a follow-up appointment in: 6 months  

## 2011-07-23 ENCOUNTER — Telehealth: Payer: Self-pay | Admitting: Cardiology

## 2011-07-23 NOTE — Telephone Encounter (Signed)
All Cardiac/Hospital faxed to [facility] @ [phone] 07/23/11/KM

## 2011-07-26 ENCOUNTER — Other Ambulatory Visit (HOSPITAL_COMMUNITY): Payer: Self-pay | Admitting: Oncology

## 2011-07-26 ENCOUNTER — Encounter (HOSPITAL_COMMUNITY): Payer: Medicare Other | Attending: Oncology

## 2011-07-26 DIAGNOSIS — C7951 Secondary malignant neoplasm of bone: Secondary | ICD-10-CM

## 2011-07-26 DIAGNOSIS — C61 Malignant neoplasm of prostate: Secondary | ICD-10-CM

## 2011-07-26 DIAGNOSIS — C7952 Secondary malignant neoplasm of bone marrow: Secondary | ICD-10-CM | POA: Insufficient documentation

## 2011-07-26 DIAGNOSIS — C801 Malignant (primary) neoplasm, unspecified: Secondary | ICD-10-CM | POA: Insufficient documentation

## 2011-07-26 MED ORDER — OXYCODONE-ACETAMINOPHEN 10-325 MG PO TABS: 1.0000 | ORAL_TABLET | Freq: Four times a day (QID) | ORAL | Status: DC | PRN

## 2011-07-26 MED ORDER — DENOSUMAB 120 MG/1.7ML ~~LOC~~ SOLN
120.0000 mg | Freq: Once | SUBCUTANEOUS | Status: AC
  Administered 2011-07-26: 120 mg via SUBCUTANEOUS
  Filled 2011-07-26: qty 1.7

## 2011-07-26 MED ORDER — OXYCODONE HCL 20 MG PO TB12: 20.0000 mg | ORAL_TABLET | Freq: Two times a day (BID) | ORAL | Status: DC

## 2011-07-26 NOTE — Progress Notes (Signed)
[name] [name] presents today for injection per MD orders. xgeva 120 mg administered SQ in right Abdomen. Administration without incident. Patient tolerated well.

## 2011-07-27 LAB — HEPATIC FUNCTION PANEL
ALT: 11 U/L (ref 0–53)
AST: 20 U/L (ref 0–37)
Albumin: 4.5 g/dL (ref 3.5–5.2)
Alkaline Phosphatase: 47 U/L (ref 39–117)
Bilirubin, Direct: 0.1 mg/dL (ref 0.0–0.3)
Indirect Bilirubin: 0.5 mg/dL (ref 0.0–0.9)
Total Bilirubin: 0.6 mg/dL (ref 0.3–1.2)
Total Protein: 6.6 g/dL (ref 6.0–8.3)

## 2011-07-27 LAB — LIPID PANEL
Cholesterol: 146 mg/dL (ref 0–200)
HDL: 47 mg/dL
LDL Cholesterol: 72 mg/dL (ref 0–99)
Total CHOL/HDL Ratio: 3.1 ratio
Triglycerides: 135 mg/dL
VLDL: 27 mg/dL (ref 0–40)

## 2011-08-23 ENCOUNTER — Other Ambulatory Visit (HOSPITAL_COMMUNITY): Payer: Self-pay | Admitting: Oncology

## 2011-08-23 ENCOUNTER — Encounter (HOSPITAL_COMMUNITY): Payer: Medicare Other | Attending: Oncology

## 2011-08-23 DIAGNOSIS — C61 Malignant neoplasm of prostate: Secondary | ICD-10-CM

## 2011-08-23 DIAGNOSIS — C7951 Secondary malignant neoplasm of bone: Secondary | ICD-10-CM | POA: Insufficient documentation

## 2011-08-23 DIAGNOSIS — C801 Malignant (primary) neoplasm, unspecified: Secondary | ICD-10-CM | POA: Insufficient documentation

## 2011-08-23 DIAGNOSIS — C7952 Secondary malignant neoplasm of bone marrow: Secondary | ICD-10-CM

## 2011-08-23 MED ORDER — OXYCODONE HCL 20 MG PO TB12: 20.0000 mg | ORAL_TABLET | Freq: Two times a day (BID) | ORAL | Status: DC

## 2011-08-23 MED ORDER — DENOSUMAB 120 MG/1.7ML ~~LOC~~ SOLN
120.0000 mg | Freq: Once | SUBCUTANEOUS | Status: AC
  Administered 2011-08-23: 120 mg via SUBCUTANEOUS
  Filled 2011-08-23: qty 1.7

## 2011-08-23 MED ORDER — OXYCODONE-ACETAMINOPHEN 10-325 MG PO TABS: 1.0000 | ORAL_TABLET | Freq: Four times a day (QID) | ORAL | Status: DC | PRN

## 2011-08-23 NOTE — Progress Notes (Signed)
[name] [name] presents today for injection per MD orders. Denosumab 120mg  administered SQ in left Abdomen. Administration without incident. Patient tolerated well.

## 2011-09-17 ENCOUNTER — Encounter (HOSPITAL_COMMUNITY): Payer: Medicare Other | Attending: Oncology

## 2011-09-17 DIAGNOSIS — C801 Malignant (primary) neoplasm, unspecified: Secondary | ICD-10-CM | POA: Insufficient documentation

## 2011-09-17 DIAGNOSIS — C7951 Secondary malignant neoplasm of bone: Secondary | ICD-10-CM

## 2011-09-17 DIAGNOSIS — C61 Malignant neoplasm of prostate: Secondary | ICD-10-CM

## 2011-09-17 DIAGNOSIS — E876 Hypokalemia: Secondary | ICD-10-CM | POA: Insufficient documentation

## 2011-09-17 LAB — COMPREHENSIVE METABOLIC PANEL
ALT: 14 U/L (ref 0–53)
AST: 16 U/L (ref 0–37)
Albumin: 4.3 g/dL (ref 3.5–5.2)
Alkaline Phosphatase: 50 U/L (ref 39–117)
BUN: 11 mg/dL (ref 6–23)
CO2: 28 mEq/L (ref 19–32)
Calcium: 9.8 mg/dL (ref 8.4–10.5)
Chloride: 100 mEq/L (ref 96–112)
Creatinine, Ser: 0.96 mg/dL (ref 0.50–1.35)
GFR calc Af Amer: >90 mL/min (ref >90)
GFR calc non Af Amer: >90 mL/min (ref >90)
Glucose, Bld: 82 mg/dL (ref 70–99)
Potassium: 4.1 mEq/L (ref 3.5–5.1)
Sodium: 138 mEq/L (ref 135–145)
Total Bilirubin: 0.4 mg/dL (ref 0.3–1.2)
Total Protein: 7.2 g/dL (ref 6.0–8.3)

## 2011-09-17 LAB — CBC
HCT: 39.5 % (ref 39.0–52.0)
Hemoglobin: 13.1 g/dL (ref 13.0–17.0)
MCH: 29.8 pg (ref 26.0–34.0)
MCHC: 33.2 g/dL (ref 30.0–36.0)
MCV: 90 fL (ref 78.0–100.0)
Platelets: 294 10*3/uL (ref 150–400)
RBC: 4.39 MIL/uL (ref 4.22–5.81)
RDW: 13.2 % (ref 11.5–15.5)
WBC: 10.3 10*3/uL (ref 4.0–10.5)

## 2011-09-17 MED ORDER — OXYCODONE HCL 20 MG PO TB12: 20.0000 mg | ORAL_TABLET | Freq: Two times a day (BID) | ORAL | Status: DC

## 2011-09-17 MED ORDER — OXYCODONE-ACETAMINOPHEN 10-325 MG PO TABS: 1.0000 | ORAL_TABLET | Freq: Four times a day (QID) | ORAL | Status: DC | PRN

## 2011-09-17 NOTE — Progress Notes (Signed)
[name] [name] presented for labwork. Labs per MD order drawn via Peripheral Line 23 gauge needle inserted in left AC  Good blood return present. Procedure without incident.  Needle removed intact. Patient tolerated procedure well.

## 2011-09-18 LAB — PSA: PSA: <0.01 ng/mL — ABNORMAL LOW (ref <=4.00)

## 2011-10-01 ENCOUNTER — Other Ambulatory Visit (HOSPITAL_COMMUNITY): Payer: Medicare Other

## 2011-10-01 ENCOUNTER — Encounter (HOSPITAL_COMMUNITY): Payer: Self-pay

## 2011-10-01 ENCOUNTER — Encounter (HOSPITAL_COMMUNITY): Payer: Medicare Other

## 2011-10-01 ENCOUNTER — Encounter (HOSPITAL_BASED_OUTPATIENT_CLINIC_OR_DEPARTMENT_OTHER): Payer: Medicare Other

## 2011-10-01 VITALS — BP 132/97 | HR 70 | Temp 97.9°F | Ht 69.0 in | Wt 175.0 lb

## 2011-10-01 DIAGNOSIS — C61 Malignant neoplasm of prostate: Secondary | ICD-10-CM

## 2011-10-01 DIAGNOSIS — M545 Low back pain: Secondary | ICD-10-CM

## 2011-10-01 DIAGNOSIS — C7951 Secondary malignant neoplasm of bone: Secondary | ICD-10-CM

## 2011-10-01 MED ORDER — DENOSUMAB 120 MG/1.7ML ~~LOC~~ SOLN
120.0000 mg | Freq: Once | SUBCUTANEOUS | Status: AC
  Administered 2011-10-01: 120 mg via SUBCUTANEOUS
  Filled 2011-10-01: qty 1.7

## 2011-10-01 NOTE — Progress Notes (Signed)
CC:   [name] [name], M.D. [name] Dubin, MD [name] [name]. [name] [name], M.D.  IDENTIFYING STATEMENT:  The patient is a 56 year old gentleman with stage IV prostate cancer who presents for followup.  HISTORY OF PRESENT ILLNESS:  The patient was seen 6 months ago.  He has had no new concerns since his last followup visit.  He has chronic low back pain associated with right leg numbness and sees Dr. Maeola [name]. His pain is well managed with medication.  He is tolerating Eligard with very minimal difficulty.  He also receives monthly denosumab.  He supplements his diet with calcium/vitamin D.  He has not lost any weight.  He has no nausea, vomiting.  At his last visit he had complained of psoriasis flareup which is now well-controlled.  MEDICATIONS:  Reviewed and updated.  ALLERGIES:  Santonic acid.  Past medical history, family history, social history unchanged.  REVIEW OF SYSTEMS:  Besides chronic low back pain controlled with pain medication and fatigue he is able to perform all his ADLs.  Review of systems negative.  PHYSICAL EXAMINATION:  General:  The patient is alert and oriented x3. Vitals:  Pulse 70, blood pressure 130/97, temperature 97.9, respirations 18, weight 175 pounds.  HEENT:  Head is atraumatic, normocephalic. Sclerae anicteric.  Mouth moist.  Neck:  Supple.  Chest:  Clear.  CVS: Unremarkable.  Abdomen:  Soft, nontender.  Bowel sounds present. Extremities:  No edema.  Lymph nodes:  No adenopathy.  CNS:  Nonfocal.  LABORATORY DATA:  On 09/17/2011 white cell count 10.3, hemoglobin 13.1, hematocrit 39.5, platelets 294.  Sodium 138, potassium 4.1, chloride 100, CO2 28, BUN 11, creatinine 0.96, glucose 82, T bilirubin 0.4, alkaline phosphatase 50, AST 16, ALT 14.  PSA less than 0.01.  IMPRESSION AND PLAN:  [name] [name] is a 56 year old gentleman with stage IV (T1c N1) poorly differentiated adenocarcinoma of the prostate.  His PSA level remains low.  He is asymptomatic.  The  patient will continue with denosumab monthly and Eligard every 6 months.  Next dose has been scheduled for 07/08/201. He follows up in 6 months' time.    ______________________________ Laurice Record, M.D. LIO/MEDQ  D:  10/01/2011  T:  10/01/2011  Job:  [id number]

## 2011-10-01 NOTE — Progress Notes (Signed)
[name] [name] presents today for injection per MD orders. xgeva 120 mg administered SQ in right Abdomen. Administration without incident. Patient tolerated well.

## 2011-10-01 NOTE — Progress Notes (Signed)
This office note has been dictated.

## 2011-10-01 NOTE — Patient Instructions (Signed)
Patient to follow up as instructed.   Current Outpatient Prescriptions  Medication Sig Dispense Refill  . ALBUTEROL IN Inhale into the lungs as needed.        . ALPRAZolam (XANAX) 0.5 MG tablet Take 1 tablet (0.5 mg total) by mouth 3 (three) times daily.  90 tablet  3  . calcipotriene (DOVONOX) 0.005 % cream Apply topically 2 (two) times daily.        . Calcium Carbonate-Vitamin D (CALTRATE 600+D PO) Take by mouth daily.        Marland Kitchen CAPEX 0.01 % SHAM       . CLOBEX SPRAY 0.05 % external spray       . denosumab (PROLIA) 60 MG/ML SOLN Inject 60 mg into the skin as directed.        . gabapentin (NEURONTIN) 300 MG capsule Take 300 mg by mouth at bedtime. TAKE TWO TABLETS AT BEDTIME.       Marland Kitchen leuprolide (ELIGARD) 45 MG injection Inject 45 mg into the skin every 6 (six) months.  1 each  12  . meclizine (ANTIVERT) 25 MG tablet Take 25 mg by mouth 3 (three) times daily as needed.      . metoprolol succinate (TOPROL-XL) 25 MG 24 hr tablet Take 25 mg by mouth daily.        Marland Kitchen oxyCODONE (OXYCONTIN) 20 MG 12 hr tablet Take 1 tablet (20 mg total) by mouth every 12 (twelve) hours.  68 tablet  0  . oxyCODONE-acetaminophen (PERCOCET) 10-325 MG per tablet Take 1 tablet by mouth every 6 (six) hours as needed for pain.  100 tablet  0  . pantoprazole (PROTONIX) 40 MG tablet Take 40 mg by mouth daily.        . pravastatin (PRAVACHOL) 40 MG tablet Take 40 mg by mouth daily.        . TACLONEX ointment       . VENTOLIN HFA 108 (90 BASE) MCG/ACT inhaler       . aspirin 81 MG tablet Take 81 mg by mouth daily.      . clopidogrel (PLAVIX) 75 MG tablet Take 75 mg by mouth daily.        . nitroGLYCERIN (NITROSTAT) 0.4 MG SL tablet Place 0.4 mg under the tongue every 5 (five) minutes as needed. FOR CHEST PAIN.        Current Facility-Administered Medications  Medication Dose Route Frequency Provider Last Rate Last Dose  . denosumab (XGEVA) injection 120 mg  120 mg Subcutaneous Once Ellouise Newer, [place]             April 2013  Sunday Monday Tuesday Wednesday Thursday Friday Saturday     1   2   3   4   5   6            7   8   9   10   11   12   13            14   15   LAB 10  12:00 PM  (10 min.)  [facility] 16   17   18   19   20            21   22   23   24   25   26   27            28    29  INJECTION 30  11:00 AM  (30 min.)  [facility] Chair 7  [facility]   OFFICE VISIT 30  11:00 AM  (30 min.)  [facility] Covering Provider  [facility] 30                 Cycle 7, Day 1 + Add'l treatments         Treatment Details  10/01/2011 - Cycle 7, Day 1 + Additional treatments     Other: SCHEDULING COMMUNICATION     Hydration: sodium chloride     Nursing Orders: sodium chloride, heparin lock flush, heparin lock flush, alteplase (CATHFLO ACTIVASE), sodium chloride     Supportive Care: denosumab (XGEVA)     Emergency Medications: diphenhydrAMINE (BENADRYL), sodium chloride, methylPREDNISolone sodium succinate (SOLU-MEDROL), EPINEPHrine (ADRENALIN), EPINEPHrine (ADRENALIN), diphenhydrAMINE (BENADRYL), albuterol (PROVENTIL)     Nursing Communication: SCHEDULING COMMUNICATION     Supportive Care: denosumab (XGEVA)     Other: leuprolide (LUPRON)     May 2013  Sunday Monday Tuesday Wednesday Thursday Friday Saturday           1   2   3   4            5   6   7   8   9   10   11            12   13   14   15   16   17   18            19   20   21   22   23   24   25            26    27   28   29   30   31         Cycle 8, Day 1         Treatment Details  10/29/2011 - Cycle 8, Day 1     Other: SCHEDULING COMMUNICATION     Hydration: sodium chloride     Nursing Orders: sodium chloride, heparin lock flush, heparin lock flush, alteplase (CATHFLO ACTIVASE), sodium chloride     Supportive  Care: denosumab (XGEVA)     Emergency Medications: diphenhydrAMINE (BENADRYL), sodium chloride, methylPREDNISolone sodium succinate (SOLU-MEDROL), EPINEPHrine (ADRENALIN), EPINEPHrine (ADRENALIN), diphenhydrAMINE (BENADRYL), albuterol (PROVENTIL)

## 2011-10-22 ENCOUNTER — Other Ambulatory Visit (HOSPITAL_COMMUNITY): Payer: Self-pay

## 2011-10-22 DIAGNOSIS — F419 Anxiety disorder, unspecified: Secondary | ICD-10-CM

## 2011-10-22 MED ORDER — ALPRAZOLAM 0.5 MG PO TABS: 0.5000 mg | ORAL_TABLET | Freq: Three times a day (TID) | ORAL | Status: DC

## 2011-10-30 ENCOUNTER — Ambulatory Visit (HOSPITAL_COMMUNITY): Payer: Medicare Other

## 2011-11-01 ENCOUNTER — Ambulatory Visit (HOSPITAL_COMMUNITY): Payer: Medicare Other

## 2011-11-02 ENCOUNTER — Ambulatory Visit (HOSPITAL_COMMUNITY): Payer: Medicare Other

## 2011-11-22 ENCOUNTER — Other Ambulatory Visit (HOSPITAL_COMMUNITY): Payer: Self-pay | Admitting: Oncology

## 2011-11-22 DIAGNOSIS — C7951 Secondary malignant neoplasm of bone: Secondary | ICD-10-CM

## 2011-11-22 DIAGNOSIS — C61 Malignant neoplasm of prostate: Secondary | ICD-10-CM

## 2011-11-22 MED ORDER — OXYCODONE-ACETAMINOPHEN 10-325 MG PO TABS: 1.0000 | ORAL_TABLET | Freq: Four times a day (QID) | ORAL | Status: DC | PRN

## 2011-11-22 MED ORDER — OXYCODONE HCL 20 MG PO TB12: 20.0000 mg | ORAL_TABLET | Freq: Two times a day (BID) | ORAL | Status: DC

## 2011-11-30 ENCOUNTER — Encounter (HOSPITAL_COMMUNITY): Payer: Medicare Other | Attending: Oncology

## 2011-11-30 ENCOUNTER — Telehealth (HOSPITAL_COMMUNITY): Payer: Self-pay | Admitting: Oncology

## 2011-11-30 VITALS — BP 134/84 | HR 51 | Temp 97.0°F

## 2011-11-30 DIAGNOSIS — C7952 Secondary malignant neoplasm of bone marrow: Secondary | ICD-10-CM

## 2011-11-30 DIAGNOSIS — C7951 Secondary malignant neoplasm of bone: Secondary | ICD-10-CM

## 2011-11-30 DIAGNOSIS — C801 Malignant (primary) neoplasm, unspecified: Secondary | ICD-10-CM | POA: Insufficient documentation

## 2011-11-30 DIAGNOSIS — C61 Malignant neoplasm of prostate: Secondary | ICD-10-CM | POA: Insufficient documentation

## 2011-11-30 MED ORDER — DENOSUMAB 120 MG/1.7ML ~~LOC~~ SOLN
120.0000 mg | Freq: Once | SUBCUTANEOUS | Status: AC
  Administered 2011-11-30: 120 mg via SUBCUTANEOUS
  Filled 2011-11-30: qty 1.7

## 2011-11-30 NOTE — Progress Notes (Signed)
Tolerated inj well. According to pharmacy metabolic panel needed prior to inj.  Dr. Mariel Sleet made aware.Stated that he would talk to Kathlene November in pharmacy on Monday if there was a problem. We have never done labs before prior to inj and in fact according to drug representatives, labs are not needed with this med.

## 2011-11-30 NOTE — Telephone Encounter (Signed)
[facility] MEDICARE [phone] ? IF CPT J9217 ELIGARD REQUIRE PRE AUTH CSR-JAMIL M REF#[id number] NO AUTH REQUIRED

## 2011-12-10 ENCOUNTER — Encounter (HOSPITAL_COMMUNITY): Payer: Medicare Other | Attending: Oncology

## 2011-12-10 ENCOUNTER — Other Ambulatory Visit (HOSPITAL_COMMUNITY): Payer: Self-pay | Admitting: Oncology

## 2011-12-10 DIAGNOSIS — Z5111 Encounter for antineoplastic chemotherapy: Secondary | ICD-10-CM

## 2011-12-10 DIAGNOSIS — C61 Malignant neoplasm of prostate: Secondary | ICD-10-CM

## 2011-12-10 DIAGNOSIS — C7952 Secondary malignant neoplasm of bone marrow: Secondary | ICD-10-CM | POA: Insufficient documentation

## 2011-12-10 DIAGNOSIS — C801 Malignant (primary) neoplasm, unspecified: Secondary | ICD-10-CM | POA: Insufficient documentation

## 2011-12-10 DIAGNOSIS — C7951 Secondary malignant neoplasm of bone: Secondary | ICD-10-CM | POA: Insufficient documentation

## 2011-12-10 DIAGNOSIS — E876 Hypokalemia: Secondary | ICD-10-CM | POA: Insufficient documentation

## 2011-12-10 LAB — CBC
HCT: 37.4 % — ABNORMAL LOW (ref 39.0–52.0)
Hemoglobin: 12.5 g/dL — ABNORMAL LOW (ref 13.0–17.0)
MCH: 30.6 pg (ref 26.0–34.0)
MCHC: 33.4 g/dL (ref 30.0–36.0)
MCV: 91.7 fL (ref 78.0–100.0)
Platelets: 261 10*3/uL (ref 150–400)
RBC: 4.08 MIL/uL — ABNORMAL LOW (ref 4.22–5.81)
RDW: 14 % (ref 11.5–15.5)
WBC: 7 10*3/uL (ref 4.0–10.5)

## 2011-12-10 LAB — COMPREHENSIVE METABOLIC PANEL
ALT: 11 U/L (ref 0–53)
AST: 22 U/L (ref 0–37)
Albumin: 4.2 g/dL (ref 3.5–5.2)
Alkaline Phosphatase: 45 U/L (ref 39–117)
BUN: 8 mg/dL (ref 6–23)
CO2: 28 mEq/L (ref 19–32)
Calcium: 9.2 mg/dL (ref 8.4–10.5)
Chloride: 104 mEq/L (ref 96–112)
Creatinine, Ser: 0.94 mg/dL (ref 0.50–1.35)
GFR calc Af Amer: >90 mL/min (ref >90)
GFR calc non Af Amer: >90 mL/min (ref >90)
Glucose, Bld: 89 mg/dL (ref 70–99)
Potassium: 3.7 mEq/L (ref 3.5–5.1)
Sodium: 140 mEq/L (ref 135–145)
Total Bilirubin: 0.5 mg/dL (ref 0.3–1.2)
Total Protein: 6.9 g/dL (ref 6.0–8.3)

## 2011-12-10 LAB — PSA: PSA: <0.01 ng/mL — ABNORMAL LOW (ref <=4.00)

## 2011-12-10 MED ORDER — SODIUM CHLORIDE 0.9 % IJ SOLN
INTRAMUSCULAR | Status: AC
  Filled 2011-12-10: qty 10

## 2011-12-10 MED ORDER — HEPARIN SOD (PORK) LOCK FLUSH 100 UNIT/ML IV SOLN
INTRAVENOUS | Status: AC
  Filled 2011-12-10: qty 5

## 2011-12-10 MED ORDER — LEUPROLIDE ACETATE (4 MONTH) 30 MG IM KIT: 45.0000 mg | PACK | Freq: Once | INTRAMUSCULAR | Status: DC

## 2011-12-10 MED ORDER — LEUPROLIDE ACETATE (6 MONTH) 45 MG ~~LOC~~ KIT
45.0000 mg | PACK | SUBCUTANEOUS | Status: DC
  Administered 2011-12-10: 45 mg via SUBCUTANEOUS
  Filled 2011-12-10: qty 45

## 2011-12-10 NOTE — Progress Notes (Signed)
[name] [name]'s reason for visit today are for labs as scheduled per MD orders.  Venipuncture performed with a 23 gauge butterfly needle to L Antecubital.  [name] [name] tolerated venipuncture well and without incident; questions were answered and patient was discharged.  [name] [name] also rc'd eligard injection as scheduled.  No questions/complaints/concerns at present.

## 2011-12-14 ENCOUNTER — Other Ambulatory Visit (HOSPITAL_COMMUNITY): Payer: Self-pay | Admitting: Neurosurgery

## 2011-12-14 DIAGNOSIS — M545 Low back pain: Secondary | ICD-10-CM

## 2011-12-17 ENCOUNTER — Ambulatory Visit (HOSPITAL_COMMUNITY)
Admission: RE | Admit: 2011-12-17 | Discharge: 2011-12-17 | Disposition: A | Discharge status: Home / Self Care | Payer: Medicare Other | Source: Ambulatory Visit | Attending: Neurosurgery | Admitting: Neurosurgery

## 2011-12-17 ENCOUNTER — Telehealth (HOSPITAL_COMMUNITY): Payer: Self-pay | Admitting: Oncology

## 2011-12-17 ENCOUNTER — Other Ambulatory Visit (HOSPITAL_COMMUNITY): Payer: Self-pay | Admitting: Oncology

## 2011-12-17 DIAGNOSIS — M545 Low back pain, unspecified: Secondary | ICD-10-CM | POA: Insufficient documentation

## 2011-12-17 DIAGNOSIS — C61 Malignant neoplasm of prostate: Secondary | ICD-10-CM

## 2011-12-17 DIAGNOSIS — M5126 Other intervertebral disc displacement, lumbar region: Secondary | ICD-10-CM | POA: Insufficient documentation

## 2011-12-17 DIAGNOSIS — C7951 Secondary malignant neoplasm of bone: Secondary | ICD-10-CM

## 2011-12-17 MED ORDER — OXYCODONE HCL 20 MG PO TB12: 20.0000 mg | ORAL_TABLET | Freq: Two times a day (BID) | ORAL | Status: DC

## 2011-12-17 MED ORDER — GADOBENATE DIMEGLUMINE 529 MG/ML IV SOLN
15.0000 mL | Freq: Once | INTRAVENOUS | Status: AC | PRN
  Administered 2011-12-17: 15 mL via INTRAVENOUS

## 2011-12-17 MED ORDER — OXYCODONE-ACETAMINOPHEN 10-325 MG PO TABS: 1.0000 | ORAL_TABLET | Freq: Four times a day (QID) | ORAL | Status: DC | PRN

## 2011-12-18 ENCOUNTER — Other Ambulatory Visit (HOSPITAL_COMMUNITY): Payer: Self-pay | Admitting: Oncology

## 2011-12-18 DIAGNOSIS — F419 Anxiety disorder, unspecified: Secondary | ICD-10-CM

## 2011-12-18 MED ORDER — ALPRAZOLAM 0.5 MG PO TABS: 0.5000 mg | ORAL_TABLET | Freq: Four times a day (QID) | ORAL | Status: DC

## 2011-12-19 ENCOUNTER — Other Ambulatory Visit (HOSPITAL_COMMUNITY): Payer: Self-pay | Admitting: Oncology

## 2011-12-19 DIAGNOSIS — C7951 Secondary malignant neoplasm of bone: Secondary | ICD-10-CM

## 2011-12-19 DIAGNOSIS — C61 Malignant neoplasm of prostate: Secondary | ICD-10-CM

## 2011-12-19 MED ORDER — GABAPENTIN 300 MG PO CAPS: 900.0000 mg | ORAL_CAPSULE | Freq: Every day | ORAL | Status: DC

## 2011-12-28 ENCOUNTER — Encounter (HOSPITAL_BASED_OUTPATIENT_CLINIC_OR_DEPARTMENT_OTHER): Payer: Medicare Other

## 2011-12-28 ENCOUNTER — Telehealth (HOSPITAL_COMMUNITY): Payer: Self-pay | Admitting: *Deleted

## 2011-12-28 VITALS — BP 125/79 | HR 60 | Temp 97.1°F

## 2011-12-28 DIAGNOSIS — C7951 Secondary malignant neoplasm of bone: Secondary | ICD-10-CM

## 2011-12-28 DIAGNOSIS — C61 Malignant neoplasm of prostate: Secondary | ICD-10-CM

## 2011-12-28 MED ORDER — DENOSUMAB 120 MG/1.7ML ~~LOC~~ SOLN
120.0000 mg | Freq: Once | SUBCUTANEOUS | Status: AC
  Administered 2011-12-28: 120 mg via SUBCUTANEOUS
  Filled 2011-12-28: qty 1.7

## 2011-12-28 NOTE — Progress Notes (Signed)
Nonie Hoyer presents today for injection per MD orders. Xgeva 120 mcg administered SQ in right Abdomen. Administration without incident. Patient tolerated well.

## 2011-12-31 ENCOUNTER — Other Ambulatory Visit (HOSPITAL_COMMUNITY): Payer: Self-pay | Admitting: Oncology

## 2011-12-31 DIAGNOSIS — C7951 Secondary malignant neoplasm of bone: Secondary | ICD-10-CM

## 2011-12-31 DIAGNOSIS — C61 Malignant neoplasm of prostate: Secondary | ICD-10-CM

## 2012-01-03 ENCOUNTER — Inpatient Hospital Stay (HOSPITAL_COMMUNITY)
Admission: RE | Admit: 2012-01-03 | Discharge: 2012-01-03 | Payer: Medicare Other | Source: Ambulatory Visit | Attending: Oncology | Admitting: Oncology

## 2012-01-03 ENCOUNTER — Other Ambulatory Visit (HOSPITAL_COMMUNITY): Payer: Self-pay | Admitting: Oncology

## 2012-01-10 ENCOUNTER — Other Ambulatory Visit (HOSPITAL_COMMUNITY): Payer: Self-pay | Admitting: Oncology

## 2012-01-10 ENCOUNTER — Ambulatory Visit (HOSPITAL_COMMUNITY)
Admission: RE | Admit: 2012-01-10 | Discharge: 2012-01-10 | Disposition: A | Discharge status: Home / Self Care | Payer: Medicare Other | Source: Ambulatory Visit | Attending: Oncology | Admitting: Oncology

## 2012-01-10 DIAGNOSIS — C61 Malignant neoplasm of prostate: Secondary | ICD-10-CM

## 2012-01-10 DIAGNOSIS — M899 Disorder of bone, unspecified: Secondary | ICD-10-CM | POA: Insufficient documentation

## 2012-01-10 DIAGNOSIS — C7952 Secondary malignant neoplasm of bone marrow: Secondary | ICD-10-CM | POA: Insufficient documentation

## 2012-01-10 DIAGNOSIS — C7951 Secondary malignant neoplasm of bone: Secondary | ICD-10-CM

## 2012-01-10 DIAGNOSIS — M949 Disorder of cartilage, unspecified: Secondary | ICD-10-CM | POA: Insufficient documentation

## 2012-01-21 ENCOUNTER — Other Ambulatory Visit (HOSPITAL_COMMUNITY): Payer: Self-pay | Admitting: Oncology

## 2012-01-21 ENCOUNTER — Telehealth (HOSPITAL_COMMUNITY): Payer: Self-pay | Admitting: *Deleted

## 2012-01-21 DIAGNOSIS — C61 Malignant neoplasm of prostate: Secondary | ICD-10-CM

## 2012-01-21 DIAGNOSIS — C7951 Secondary malignant neoplasm of bone: Secondary | ICD-10-CM

## 2012-01-21 MED ORDER — OXYCODONE HCL 20 MG PO TB12: 20.0000 mg | ORAL_TABLET | Freq: Two times a day (BID) | ORAL | Status: DC

## 2012-01-21 MED ORDER — OXYCODONE-ACETAMINOPHEN 10-325 MG PO TABS: 1.0000 | ORAL_TABLET | Freq: Four times a day (QID) | ORAL | Status: DC | PRN

## 2012-01-21 NOTE — Telephone Encounter (Signed)
Rx ready for pick up. 

## 2012-01-23 ENCOUNTER — Other Ambulatory Visit: Payer: Self-pay | Admitting: Physician Assistant

## 2012-01-24 ENCOUNTER — Encounter: Payer: Self-pay | Admitting: Oncology

## 2012-01-29 ENCOUNTER — Encounter (HOSPITAL_COMMUNITY): Payer: Medicare Other

## 2012-01-29 ENCOUNTER — Other Ambulatory Visit (HOSPITAL_COMMUNITY): Payer: Self-pay | Admitting: *Deleted

## 2012-01-29 ENCOUNTER — Encounter (HOSPITAL_COMMUNITY): Payer: Medicare Other | Attending: Oncology

## 2012-01-29 VITALS — BP 124/73 | HR 76

## 2012-01-29 DIAGNOSIS — C61 Malignant neoplasm of prostate: Secondary | ICD-10-CM

## 2012-01-29 DIAGNOSIS — E876 Hypokalemia: Secondary | ICD-10-CM

## 2012-01-29 DIAGNOSIS — C801 Malignant (primary) neoplasm, unspecified: Secondary | ICD-10-CM | POA: Insufficient documentation

## 2012-01-29 DIAGNOSIS — C7951 Secondary malignant neoplasm of bone: Secondary | ICD-10-CM

## 2012-01-29 DIAGNOSIS — C7952 Secondary malignant neoplasm of bone marrow: Secondary | ICD-10-CM | POA: Insufficient documentation

## 2012-01-29 LAB — COMPREHENSIVE METABOLIC PANEL
ALT: 7 U/L (ref 0–53)
AST: 22 U/L (ref 0–37)
Albumin: 4.1 g/dL (ref 3.5–5.2)
Alkaline Phosphatase: 61 U/L (ref 39–117)
BUN: 12 mg/dL (ref 6–23)
CO2: 25 mEq/L (ref 19–32)
Calcium: 9.4 mg/dL (ref 8.4–10.5)
Chloride: 103 mEq/L (ref 96–112)
Creatinine, Ser: 1.21 mg/dL (ref 0.50–1.35)
GFR calc Af Amer: 75 mL/min — ABNORMAL LOW (ref >90)
GFR calc non Af Amer: 65 mL/min — ABNORMAL LOW (ref >90)
Glucose, Bld: 112 mg/dL — ABNORMAL HIGH (ref 70–99)
Potassium: 3.8 mEq/L (ref 3.5–5.1)
Sodium: 138 mEq/L (ref 135–145)
Total Bilirubin: 0.4 mg/dL (ref 0.3–1.2)
Total Protein: 6.8 g/dL (ref 6.0–8.3)

## 2012-01-29 MED ORDER — DENOSUMAB 120 MG/1.7ML ~~LOC~~ SOLN
120.0000 mg | Freq: Once | SUBCUTANEOUS | Status: AC
  Administered 2012-01-29: 120 mg via SUBCUTANEOUS
  Filled 2012-01-29: qty 1.7

## 2012-01-29 NOTE — Progress Notes (Signed)
[name] [name] presented for labwork. Labs per MD order drawn via Peripheral Line 25 gauge needle inserted in rt ac.  Good blood return present. Procedure without incident.  Needle removed intact. Patient tolerated procedure well.  [name] [name] presents today for injection per MD orders. denosumab 120 mg administered SQ in right Abdomen. Administration without incident. Patient tolerated well.

## 2012-01-31 ENCOUNTER — Encounter: Payer: Self-pay | Admitting: *Deleted

## 2012-01-31 ENCOUNTER — Ambulatory Visit (INDEPENDENT_AMBULATORY_CARE_PROVIDER_SITE_OTHER): Payer: Medicare Other | Admitting: Cardiology

## 2012-01-31 ENCOUNTER — Encounter: Payer: Self-pay | Admitting: Cardiology

## 2012-01-31 VITALS — BP 130/86 | HR 46 | Ht 69.0 in | Wt 164.4 lb

## 2012-01-31 DIAGNOSIS — E782 Mixed hyperlipidemia: Secondary | ICD-10-CM

## 2012-01-31 DIAGNOSIS — I10 Essential (primary) hypertension: Secondary | ICD-10-CM

## 2012-01-31 NOTE — Patient Instructions (Addendum)
Your physician recommends that you schedule a follow-up appointment in: 6 months  Your physician recommends that you return for lab work in: Prior to that visit - You will receive a reminder letter in the mail prior to

## 2012-01-31 NOTE — Progress Notes (Signed)
Clinical Summary [name] [name] is a 56 y.o.male presenting for followup. He was seen in February. He continues follow with Oncology. He reports chest pain approximately 3 weeks ago - had been doing some yard work and thinks he "overdid it." None since. He reports compliance with his medications.  Recent labwork showed potassiu, 3.8, BUN 12, creatinine 1.2, AST 22, and ALT 7. Lipid panel in 2/13 showed cholesterol 146, triglycerides 135, HDL 47, and LDL 72. ECG shows sinus bradycardia, decreased R waves.   Allergies  Allergen Reactions  . Zoledronic Acid     ELEVATED BP, DIAPHORETIC & DIZZINESS    Current Outpatient Prescriptions  Medication Sig Dispense Refill  . ALBUTEROL IN Inhale into the lungs as needed.        . ALPRAZolam (XANAX) 0.5 MG tablet Take 1 tablet (0.5 mg total) by mouth 4 (four) times daily.  120 tablet  3  . aspirin 81 MG tablet Take 81 mg by mouth daily.      . calcipotriene (DOVONOX) 0.005 % cream Apply topically 2 (two) times daily.        . Calcium Carbonate-Vitamin D (CALTRATE 600+D PO) Take by mouth daily.        Marland Kitchen CAPEX 0.01 % SHAM Apply 1 application topically as needed.       Marland Kitchen CLOBEX SPRAY 0.05 % external spray Apply 5 sprays topically daily.       Marland Kitchen denosumab (PROLIA) 60 MG/ML SOLN Inject 60 mg into the skin as directed.        . diazepam (VALIUM) 10 MG tablet Take 10 mg by mouth as needed.       . gabapentin (NEURONTIN) 300 MG capsule Take 600 mg by mouth at bedtime. TAKE TWO TABLETS AT BEDTIME.      Marland Kitchen leuprolide (ELIGARD) 45 MG injection Inject 45 mg into the skin every 6 (six) months.  1 each  12  . meclizine (ANTIVERT) 25 MG tablet Take 25 mg by mouth 3 (three) times daily as needed.      . metoprolol succinate (TOPROL-XL) 25 MG 24 hr tablet Take 25 mg by mouth daily.        Marland Kitchen NITROSTAT 0.4 MG SL tablet PLACE 1 TAB UNDER TONGUE AS DIRECTED FOR CHEST PAIN.  25 each  4  . oxyCODONE (OXYCONTIN) 20 MG 12 hr tablet Take 1 tablet (20 mg total) by mouth every 12  (twelve) hours.  68 tablet  0  . oxyCODONE-acetaminophen (PERCOCET) 10-325 MG per tablet Take 1 tablet by mouth every 6 (six) hours as needed for pain.  100 tablet  0  . pantoprazole (PROTONIX) 40 MG tablet Take 40 mg by mouth daily.        . pravastatin (PRAVACHOL) 40 MG tablet Take 40 mg by mouth daily.        Mordecai Maes ointment Apply 1 application topically daily.       Marland Kitchen DISCONTD: gabapentin (NEURONTIN) 300 MG capsule Take 3 capsules (900 mg total) by mouth at bedtime. TAKE TWO TABLETS AT BEDTIME.  90 capsule  3    Past Medical History  Diagnosis Date  . Coronary artery disease     DES to LAD 1/12  . COPD (chronic obstructive pulmonary disease)   . Sigmoid diverticulosis   . DDD (degenerative disc disease)   . Anxiety   . Depression   . Psoriasis   . Tubular adenoma     Colonoscopy 1/12  . Horseshoe kidney  Single kidney  . Prostate cancer     Metastatic, stage IV  - Dr. Mariel Sleet  . Internal hemorrhoids     Colonoscopy 1/12  . Drug reaction     Pamidronate - induced skin toxicity, Zometa - induced toxicity  . GERD (gastroesophageal reflux disease)   . Gastritis   . Prostate ca 12/29/2010  . Bone metastases 12/29/2010  . Osteoarthritis     Social History Mr. Hostetler reports that he quit smoking about 4 years ago. His smoking use included Cigarettes. He has a 15 pack-year smoking history. He has never used smokeless tobacco. Mr. Kitchings reports that he does not drink alcohol.  Review of Systems No palpitations or major bleeding episodes. Appetite OK. Plans to start rehabilitation for his back. Otherwise negative.  Physical Examination Filed Vitals:   01/31/12 0900  BP: 130/86  Pulse: 46    Chronically ill-appearing male in no acute distress.  HEENT: Conjunctiva and lids normal, oropharynx with poor dentition.  Neck: Supple, no elevated JVP or loud bruits, no thyromegaly.  Lungs: Diminished breath sounds, nonlabored, no wheezing.  Cardiac: Regular rate and  rhythm, soft systolic murmur at the base, but her second heart sound, no S3.  Abdomen: Soft, nontender, bowel sounds present.  Skin: Warm and dry, scattered ecchymoses.  Musculoskeletal: No kyphosis.  Extremities: No pitting edema, distal pulses one plus. .   Problem List and Plan   CORONARY ATHEROSCLEROSIS NATIVE CORONARY ARTERY Continue medical therapy and observation. ECG reviewed. Followup in 6 months.  Mixed hyperlipidemia LDL has been at goal this year. Check FLP and LFT for next visit.    Jonelle Sidle, M.D., F.A.C.C.

## 2012-01-31 NOTE — Assessment & Plan Note (Signed)
LDL has been at goal this year. Check FLP and LFT for next visit.

## 2012-01-31 NOTE — Assessment & Plan Note (Signed)
Continue medical therapy and observation. ECG reviewed. Followup in 6 months.

## 2012-02-22 ENCOUNTER — Other Ambulatory Visit (HOSPITAL_COMMUNITY): Payer: Self-pay | Admitting: Oncology

## 2012-02-22 DIAGNOSIS — C7951 Secondary malignant neoplasm of bone: Secondary | ICD-10-CM

## 2012-02-22 DIAGNOSIS — C61 Malignant neoplasm of prostate: Secondary | ICD-10-CM

## 2012-02-22 MED ORDER — OXYCODONE HCL 20 MG PO TB12: 20.0000 mg | ORAL_TABLET | Freq: Two times a day (BID) | ORAL | Status: DC

## 2012-02-22 MED ORDER — OXYCODONE-ACETAMINOPHEN 10-325 MG PO TABS: 1.0000 | ORAL_TABLET | Freq: Four times a day (QID) | ORAL | Status: DC | PRN

## 2012-02-27 ENCOUNTER — Encounter (HOSPITAL_COMMUNITY): Payer: Medicare Other | Attending: Oncology

## 2012-02-27 DIAGNOSIS — E876 Hypokalemia: Secondary | ICD-10-CM

## 2012-02-27 DIAGNOSIS — E782 Mixed hyperlipidemia: Secondary | ICD-10-CM

## 2012-02-27 DIAGNOSIS — C7952 Secondary malignant neoplasm of bone marrow: Secondary | ICD-10-CM | POA: Insufficient documentation

## 2012-02-27 DIAGNOSIS — C7951 Secondary malignant neoplasm of bone: Secondary | ICD-10-CM | POA: Insufficient documentation

## 2012-02-27 DIAGNOSIS — I10 Essential (primary) hypertension: Secondary | ICD-10-CM

## 2012-02-27 DIAGNOSIS — C61 Malignant neoplasm of prostate: Secondary | ICD-10-CM

## 2012-02-27 DIAGNOSIS — C801 Malignant (primary) neoplasm, unspecified: Secondary | ICD-10-CM | POA: Insufficient documentation

## 2012-02-27 LAB — CBC
HCT: 37.6 % — ABNORMAL LOW (ref 39.0–52.0)
Hemoglobin: 12.7 g/dL — ABNORMAL LOW (ref 13.0–17.0)
MCH: 30.8 pg (ref 26.0–34.0)
MCHC: 33.8 g/dL (ref 30.0–36.0)
MCV: 91 fL (ref 78.0–100.0)
Platelets: 267 10*3/uL (ref 150–400)
RBC: 4.13 MIL/uL — ABNORMAL LOW (ref 4.22–5.81)
RDW: 13.6 % (ref 11.5–15.5)
WBC: 6.6 10*3/uL (ref 4.0–10.5)

## 2012-02-28 LAB — PSA: PSA: <0.01 ng/mL — ABNORMAL LOW (ref <=4.00)

## 2012-02-29 ENCOUNTER — Encounter (HOSPITAL_BASED_OUTPATIENT_CLINIC_OR_DEPARTMENT_OTHER): Payer: Medicare Other

## 2012-02-29 DIAGNOSIS — C7952 Secondary malignant neoplasm of bone marrow: Secondary | ICD-10-CM

## 2012-02-29 DIAGNOSIS — C7951 Secondary malignant neoplasm of bone: Secondary | ICD-10-CM

## 2012-02-29 DIAGNOSIS — C61 Malignant neoplasm of prostate: Secondary | ICD-10-CM

## 2012-02-29 LAB — COMPREHENSIVE METABOLIC PANEL
ALT: 13 U/L (ref 0–53)
AST: 24 U/L (ref 0–37)
Albumin: 4.1 g/dL (ref 3.5–5.2)
Alkaline Phosphatase: 58 U/L (ref 39–117)
BUN: 11 mg/dL (ref 6–23)
CO2: 25 mEq/L (ref 19–32)
Calcium: 9.3 mg/dL (ref 8.4–10.5)
Chloride: 102 mEq/L (ref 96–112)
Creatinine, Ser: 0.93 mg/dL (ref 0.50–1.35)
GFR calc Af Amer: >90 mL/min (ref >90)
GFR calc non Af Amer: >90 mL/min (ref >90)
Glucose, Bld: 98 mg/dL (ref 70–99)
Potassium: 3.9 mEq/L (ref 3.5–5.1)
Sodium: 138 mEq/L (ref 135–145)
Total Bilirubin: 0.2 mg/dL — ABNORMAL LOW (ref 0.3–1.2)
Total Protein: 6.9 g/dL (ref 6.0–8.3)

## 2012-02-29 MED ORDER — DENOSUMAB 120 MG/1.7ML ~~LOC~~ SOLN
120.0000 mg | Freq: Once | SUBCUTANEOUS | Status: AC
  Administered 2012-02-29: 120 mg via SUBCUTANEOUS
  Filled 2012-02-29: qty 1.7

## 2012-02-29 NOTE — Progress Notes (Signed)
Tolerated injection well. 

## 2012-03-21 ENCOUNTER — Other Ambulatory Visit (HOSPITAL_COMMUNITY): Payer: Self-pay | Admitting: Oncology

## 2012-03-21 DIAGNOSIS — C61 Malignant neoplasm of prostate: Secondary | ICD-10-CM

## 2012-03-21 DIAGNOSIS — C7951 Secondary malignant neoplasm of bone: Secondary | ICD-10-CM

## 2012-03-21 MED ORDER — OXYCODONE HCL 20 MG PO TB12: 20.0000 mg | ORAL_TABLET | Freq: Two times a day (BID) | ORAL | Status: DC

## 2012-03-21 MED ORDER — OXYCODONE-ACETAMINOPHEN 10-325 MG PO TABS: 1.0000 | ORAL_TABLET | Freq: Four times a day (QID) | ORAL | Status: DC | PRN

## 2012-03-28 ENCOUNTER — Encounter (HOSPITAL_COMMUNITY): Payer: Medicare Other | Attending: Oncology

## 2012-03-28 DIAGNOSIS — C7952 Secondary malignant neoplasm of bone marrow: Secondary | ICD-10-CM | POA: Insufficient documentation

## 2012-03-28 DIAGNOSIS — C801 Malignant (primary) neoplasm, unspecified: Secondary | ICD-10-CM | POA: Insufficient documentation

## 2012-03-28 DIAGNOSIS — C61 Malignant neoplasm of prostate: Secondary | ICD-10-CM | POA: Insufficient documentation

## 2012-03-28 DIAGNOSIS — C7951 Secondary malignant neoplasm of bone: Secondary | ICD-10-CM | POA: Insufficient documentation

## 2012-03-28 LAB — CBC WITH DIFFERENTIAL/PLATELET
Basophils Absolute: 0.1 10*3/uL (ref 0.0–0.1)
Basophils Relative: 1 % (ref 0–1)
Eosinophils Absolute: 0.3 10*3/uL (ref 0.0–0.7)
Eosinophils Relative: 4 % (ref 0–5)
HCT: 38 % — ABNORMAL LOW (ref 39.0–52.0)
Hemoglobin: 12.6 g/dL — ABNORMAL LOW (ref 13.0–17.0)
Lymphocytes Relative: 35 % (ref 12–46)
Lymphs Abs: 2.6 10*3/uL (ref 0.7–4.0)
MCH: 30.5 pg (ref 26.0–34.0)
MCHC: 33.2 g/dL (ref 30.0–36.0)
MCV: 92 fL (ref 78.0–100.0)
Monocytes Absolute: 0.5 10*3/uL (ref 0.1–1.0)
Monocytes Relative: 7 % (ref 3–12)
Neutro Abs: 4.1 10*3/uL (ref 1.7–7.7)
Neutrophils Relative %: 54 % (ref 43–77)
Platelets: 417 10*3/uL — ABNORMAL HIGH (ref 150–400)
RBC: 4.13 MIL/uL — ABNORMAL LOW (ref 4.22–5.81)
RDW: 13.4 % (ref 11.5–15.5)
WBC: 7.5 10*3/uL (ref 4.0–10.5)

## 2012-03-28 LAB — COMPREHENSIVE METABOLIC PANEL
ALT: 17 U/L (ref 0–53)
AST: 17 U/L (ref 0–37)
Albumin: 4 g/dL (ref 3.5–5.2)
Alkaline Phosphatase: 57 U/L (ref 39–117)
BUN: 6 mg/dL (ref 6–23)
CO2: 29 mEq/L (ref 19–32)
Calcium: 9.5 mg/dL (ref 8.4–10.5)
Chloride: 102 mEq/L (ref 96–112)
Creatinine, Ser: 0.92 mg/dL (ref 0.50–1.35)
GFR calc Af Amer: >90 mL/min (ref >90)
GFR calc non Af Amer: >90 mL/min (ref >90)
Glucose, Bld: 104 mg/dL — ABNORMAL HIGH (ref 70–99)
Potassium: 4.1 mEq/L (ref 3.5–5.1)
Sodium: 141 mEq/L (ref 135–145)
Total Bilirubin: 0.2 mg/dL — ABNORMAL LOW (ref 0.3–1.2)
Total Protein: 7.1 g/dL (ref 6.0–8.3)

## 2012-03-28 LAB — PSA: PSA: <0.01 ng/mL — ABNORMAL LOW (ref <=4.00)

## 2012-03-28 NOTE — Progress Notes (Signed)
Labs drawn today for cbc/diff,cmp,psa 

## 2012-03-31 ENCOUNTER — Encounter (HOSPITAL_COMMUNITY): Payer: Medicare Other

## 2012-03-31 ENCOUNTER — Other Ambulatory Visit (HOSPITAL_COMMUNITY): Payer: Medicare Other

## 2012-03-31 ENCOUNTER — Encounter (HOSPITAL_BASED_OUTPATIENT_CLINIC_OR_DEPARTMENT_OTHER): Payer: Medicare Other | Admitting: Oncology

## 2012-03-31 ENCOUNTER — Encounter (HOSPITAL_COMMUNITY): Payer: Self-pay | Admitting: Oncology

## 2012-03-31 VITALS — BP 89/63 | HR 64 | Temp 96.9°F | Resp 16 | Wt 163.7 lb

## 2012-03-31 DIAGNOSIS — C7951 Secondary malignant neoplasm of bone: Secondary | ICD-10-CM

## 2012-03-31 DIAGNOSIS — M479 Spondylosis, unspecified: Secondary | ICD-10-CM

## 2012-03-31 DIAGNOSIS — C61 Malignant neoplasm of prostate: Secondary | ICD-10-CM

## 2012-03-31 DIAGNOSIS — R599 Enlarged lymph nodes, unspecified: Secondary | ICD-10-CM

## 2012-03-31 DIAGNOSIS — M810 Age-related osteoporosis without current pathological fracture: Secondary | ICD-10-CM

## 2012-03-31 MED ORDER — OXYCODONE HCL 20 MG PO TB12: ORAL_TABLET | ORAL | Status: DC

## 2012-03-31 MED ORDER — DENOSUMAB 120 MG/1.7ML ~~LOC~~ SOLN
120.0000 mg | Freq: Once | SUBCUTANEOUS | Status: AC
  Administered 2012-03-31: 120 mg via SUBCUTANEOUS
  Filled 2012-03-31: qty 1.7

## 2012-03-31 MED ORDER — LUMBAR BACK BRACE/SUPPORT PAD MISC: 1.0000 | Status: AC

## 2012-03-31 NOTE — Progress Notes (Signed)
[name] [name] presents today for injection per MD orders. denosumab 120 mg administered SQ in right Abdomen. Administration without incident. Patient tolerated well.

## 2012-03-31 NOTE — Progress Notes (Signed)
Problem #1 stage IV (T1 C., N1) poorly differentiated adenocarcinoma prostate status post radical prostatectomy on 02/28/2006 by Dr. Margo Aye in [facility]. He was then of lymph node involvement but bone scan was negative. He has been on Depo-Lupron in the past and now Eligard 45 mg every 6 months. 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 [name] Problem #3 anxiety and depression much improved Problem #4 weakness and fatigue much improved Problem #5 heart disease status post cardiac catheterization on day January 25th 2012 followed by stent placement February 2012. He is worried about his ex-wife with whom he remains close who has developed a malignancy. He still has chronic low back pain and I am going to prescribe a back brace and increase his OxyContin to 20 mg 3 times a day. I do not want to increase his short acting oxycodone. He has recently been treated by Dr. Gerda Diss for a sinus infection of the right maxillary sinus. He states that it started out smelling like rotten eggs consistent with a possible Pseudomonas infection. The smell has disappeared and he has finished all 10 days of what sounds like Augmentin. He has a little pressure still but I will leave this issue to his primary care physician. He knows to call him if he gets worse.  Otherwise he looks good but has a lymph node at the base of the neck on the left posterior area 1 cm x 1 cm which is soft non-indurated. He is not aware of its presence. He has no lymphadenopathy anywhere else. His lungs are clear. He does not have distinct muscle spasticity. Heart shows a regular rhythm and rate without murmur rub or gallop. Abdomen is soft and nontender without hepatosplenomegaly. Bowel sounds are normal. He has no leg edema or arm edema. His skin is very dry I suspect from the therapy.  I will see him in 12 weeks see if  his pain is better and see had a back brace is done and what this lymph node is doing. His PSA remains well within the normal range

## 2012-03-31 NOTE — Patient Instructions (Addendum)
[facility]  Discharge Instructions  RECOMMENDATIONS MADE BY THE CONSULTANT AND ANY TEST RESULTS WILL BE SENT TO YOUR REFERRING DOCTOR.   EXAM FINDINGS BY MD TODAY AND SIGNS AND SYMPTOMS TO REPORT TO CLINIC OR PRIMARY MD: exam and discussion by MD.  Stop drinking tea after 5 pm.  MD also ordered back support for you to use to help with your back.  A small soft lymph node was felt in your left neck area so MD wants to see you back in 3 months.  See your primary care MD about the sinus problem. You received your denosumab injection today.  MEDICATIONS PRESCRIBED: Oxycontin increase to 10 mg three times daily. Follow label directions  INSTRUCTIONS GIVEN AND DISCUSSED: Other :  Report increased pain or shortness of breath.  SPECIAL INSTRUCTIONS/FOLLOW-UP: Lab work Needed in January and before your denosumab injection and Return to Clinic in 3 months to see MD.   I acknowledge that I have been informed and understand all the instructions given to me and received a copy. I do not have any more questions at this time, but understand that I may call the Specialty Clinic at [facility] at [phone] during business hours should I have any further questions or need assistance in obtaining follow-up care.    __________________________________________  _____________  __________ Signature of Patient or Authorized Representative            Date                   Time    __________________________________________ Nurse's Signature

## 2012-04-01 ENCOUNTER — Ambulatory Visit (HOSPITAL_COMMUNITY): Payer: Medicare Other | Admitting: Oncology

## 2012-04-02 ENCOUNTER — Telehealth (HOSPITAL_COMMUNITY): Payer: Self-pay | Admitting: *Deleted

## 2012-04-02 NOTE — Telephone Encounter (Signed)
Pt states he checked with [facility] but they do not have the order for back brace that was discussed on exam 10/28

## 2012-04-23 ENCOUNTER — Other Ambulatory Visit (HOSPITAL_COMMUNITY): Payer: Self-pay | Admitting: Oncology

## 2012-04-23 DIAGNOSIS — C7951 Secondary malignant neoplasm of bone: Secondary | ICD-10-CM

## 2012-04-23 DIAGNOSIS — C61 Malignant neoplasm of prostate: Secondary | ICD-10-CM

## 2012-04-23 MED ORDER — OXYCODONE-ACETAMINOPHEN 10-325 MG PO TABS: 1.0000 | ORAL_TABLET | Freq: Four times a day (QID) | ORAL | Status: DC | PRN

## 2012-04-28 ENCOUNTER — Encounter (HOSPITAL_COMMUNITY): Payer: Medicare Other | Attending: Oncology

## 2012-04-28 VITALS — BP 128/79 | HR 55 | Temp 97.8°F | Resp 18

## 2012-04-28 DIAGNOSIS — C61 Malignant neoplasm of prostate: Secondary | ICD-10-CM

## 2012-04-28 DIAGNOSIS — C801 Malignant (primary) neoplasm, unspecified: Secondary | ICD-10-CM | POA: Insufficient documentation

## 2012-04-28 DIAGNOSIS — C7951 Secondary malignant neoplasm of bone: Secondary | ICD-10-CM

## 2012-04-28 LAB — COMPREHENSIVE METABOLIC PANEL
ALT: 11 U/L (ref 0–53)
AST: 17 U/L (ref 0–37)
Albumin: 4 g/dL (ref 3.5–5.2)
Alkaline Phosphatase: 55 U/L (ref 39–117)
BUN: 6 mg/dL (ref 6–23)
CO2: 26 mEq/L (ref 19–32)
Calcium: 9.1 mg/dL (ref 8.4–10.5)
Chloride: 103 mEq/L (ref 96–112)
Creatinine, Ser: 0.9 mg/dL (ref 0.50–1.35)
GFR calc Af Amer: >90 mL/min (ref >90)
GFR calc non Af Amer: >90 mL/min (ref >90)
Glucose, Bld: 93 mg/dL (ref 70–99)
Potassium: 3.7 mEq/L (ref 3.5–5.1)
Sodium: 139 mEq/L (ref 135–145)
Total Bilirubin: 0.3 mg/dL (ref 0.3–1.2)
Total Protein: 6.8 g/dL (ref 6.0–8.3)

## 2012-04-28 LAB — CBC WITH DIFFERENTIAL/PLATELET
Basophils Absolute: 0 10*3/uL (ref 0.0–0.1)
Basophils Relative: 1 % (ref 0–1)
Eosinophils Absolute: 0.3 10*3/uL (ref 0.0–0.7)
Eosinophils Relative: 4 % (ref 0–5)
HCT: 34.3 % — ABNORMAL LOW (ref 39.0–52.0)
Hemoglobin: 11.6 g/dL — ABNORMAL LOW (ref 13.0–17.0)
Lymphocytes Relative: 35 % (ref 12–46)
Lymphs Abs: 2.3 10*3/uL (ref 0.7–4.0)
MCH: 30.3 pg (ref 26.0–34.0)
MCHC: 33.8 g/dL (ref 30.0–36.0)
MCV: 89.6 fL (ref 78.0–100.0)
Monocytes Absolute: 0.6 10*3/uL (ref 0.1–1.0)
Monocytes Relative: 9 % (ref 3–12)
Neutro Abs: 3.2 10*3/uL (ref 1.7–7.7)
Neutrophils Relative %: 51 % (ref 43–77)
Platelets: 338 10*3/uL (ref 150–400)
RBC: 3.83 MIL/uL — ABNORMAL LOW (ref 4.22–5.81)
RDW: 13.4 % (ref 11.5–15.5)
WBC: 6.4 10*3/uL (ref 4.0–10.5)

## 2012-04-28 MED ORDER — DENOSUMAB 120 MG/1.7ML ~~LOC~~ SOLN
120.0000 mg | Freq: Once | SUBCUTANEOUS | Status: AC
  Administered 2012-04-28: 120 mg via SUBCUTANEOUS
  Filled 2012-04-28: qty 1.7

## 2012-04-28 NOTE — Progress Notes (Signed)
[name] [name] presents today for injection per MD orders. Xgeva administered SQ in left Abdomen. Administration without incident. Patient tolerated well. [name] [name] presented for labwork. Labs per MD order drawn via Peripheral Line 23 gauge needle inserted in left antecubital.  Good blood return present. Procedure without incident.  Needle removed intact. Patient tolerated procedure well.

## 2012-04-29 LAB — PSA: PSA: <0.01 ng/mL — ABNORMAL LOW (ref <=4.00)

## 2012-05-06 ENCOUNTER — Other Ambulatory Visit (HOSPITAL_COMMUNITY): Payer: Self-pay | Admitting: Oncology

## 2012-05-06 DIAGNOSIS — C61 Malignant neoplasm of prostate: Secondary | ICD-10-CM

## 2012-05-06 DIAGNOSIS — C7951 Secondary malignant neoplasm of bone: Secondary | ICD-10-CM

## 2012-05-06 MED ORDER — OXYCODONE HCL 20 MG PO TB12: 20.0000 mg | ORAL_TABLET | Freq: Three times a day (TID) | ORAL | Status: DC

## 2012-05-12 ENCOUNTER — Telehealth (HOSPITAL_COMMUNITY): Payer: Self-pay

## 2012-05-12 NOTE — Telephone Encounter (Signed)
Spoke with patient regarding use of Alprazolam and Diazepam.  States " I only use the diazepam for my lower back muscle spasms and the alprazolam for my nerves.  I don't take them both at the same time.  I don't take the diazepam very often and I have an appointment to see Dr. [name] on 12/11 and I'm going to talk with him about stopping it."

## 2012-05-21 ENCOUNTER — Telehealth (HOSPITAL_COMMUNITY): Payer: Self-pay | Admitting: Oncology

## 2012-05-21 ENCOUNTER — Other Ambulatory Visit (HOSPITAL_COMMUNITY): Payer: Self-pay | Admitting: Oncology

## 2012-05-21 DIAGNOSIS — C61 Malignant neoplasm of prostate: Secondary | ICD-10-CM

## 2012-05-21 DIAGNOSIS — C7951 Secondary malignant neoplasm of bone: Secondary | ICD-10-CM

## 2012-05-21 MED ORDER — OXYCODONE-ACETAMINOPHEN 10-325 MG PO TABS: 1.0000 | ORAL_TABLET | Freq: Four times a day (QID) | ORAL | Status: DC | PRN

## 2012-05-26 ENCOUNTER — Encounter (HOSPITAL_COMMUNITY): Payer: Medicare Other | Attending: Oncology

## 2012-05-26 VITALS — BP 126/88 | HR 65 | Temp 97.9°F | Resp 18

## 2012-05-26 DIAGNOSIS — C801 Malignant (primary) neoplasm, unspecified: Secondary | ICD-10-CM | POA: Insufficient documentation

## 2012-05-26 DIAGNOSIS — C7951 Secondary malignant neoplasm of bone: Secondary | ICD-10-CM | POA: Insufficient documentation

## 2012-05-26 DIAGNOSIS — C7952 Secondary malignant neoplasm of bone marrow: Secondary | ICD-10-CM

## 2012-05-26 DIAGNOSIS — C61 Malignant neoplasm of prostate: Secondary | ICD-10-CM | POA: Insufficient documentation

## 2012-05-26 LAB — COMPREHENSIVE METABOLIC PANEL
ALT: 13 U/L (ref 0–53)
AST: 24 U/L (ref 0–37)
Albumin: 4.1 g/dL (ref 3.5–5.2)
Alkaline Phosphatase: 59 U/L (ref 39–117)
BUN: 11 mg/dL (ref 6–23)
CO2: 28 mEq/L (ref 19–32)
Calcium: 8.7 mg/dL (ref 8.4–10.5)
Chloride: 103 mEq/L (ref 96–112)
Creatinine, Ser: 0.95 mg/dL (ref 0.50–1.35)
GFR calc Af Amer: >90 mL/min (ref >90)
GFR calc non Af Amer: >90 mL/min (ref >90)
Glucose, Bld: 99 mg/dL (ref 70–99)
Potassium: 4.2 mEq/L (ref 3.5–5.1)
Sodium: 139 mEq/L (ref 135–145)
Total Bilirubin: 0.4 mg/dL (ref 0.3–1.2)
Total Protein: 7.1 g/dL (ref 6.0–8.3)

## 2012-05-26 MED ORDER — DENOSUMAB 120 MG/1.7ML ~~LOC~~ SOLN
120.0000 mg | Freq: Once | SUBCUTANEOUS | Status: AC
  Administered 2012-05-26: 120 mg via SUBCUTANEOUS
  Filled 2012-05-26: qty 1.7

## 2012-05-26 NOTE — Progress Notes (Signed)
Lorena E Shin's reason for visit today is for an injection and labs as scheduled per MD orders.  Labs were drawn prior to administration of ordered medication.  Venipuncture performed with a 23 gauge butterfly needle to R Antecubital.  Nonie Hoyer also received Rivka Barbara per MD orders; see [facility] for administration details.  Tahjai E Quito tolerated all procedures well and without incident; questions were answered and patient was discharged.

## 2012-06-06 ENCOUNTER — Telehealth (HOSPITAL_COMMUNITY): Payer: Self-pay | Admitting: *Deleted

## 2012-06-11 ENCOUNTER — Encounter (HOSPITAL_COMMUNITY): Payer: Medicare Other | Attending: Oncology

## 2012-06-11 VITALS — BP 132/82 | HR 52 | Temp 96.7°F | Resp 18 | Wt 166.2 lb

## 2012-06-11 DIAGNOSIS — M549 Dorsalgia, unspecified: Secondary | ICD-10-CM | POA: Insufficient documentation

## 2012-06-11 DIAGNOSIS — C801 Malignant (primary) neoplasm, unspecified: Secondary | ICD-10-CM | POA: Insufficient documentation

## 2012-06-11 DIAGNOSIS — Z0000 Encounter for general adult medical examination without abnormal findings: Secondary | ICD-10-CM | POA: Insufficient documentation

## 2012-06-11 DIAGNOSIS — C61 Malignant neoplasm of prostate: Secondary | ICD-10-CM | POA: Insufficient documentation

## 2012-06-11 DIAGNOSIS — C7951 Secondary malignant neoplasm of bone: Secondary | ICD-10-CM | POA: Insufficient documentation

## 2012-06-11 MED ORDER — LEUPROLIDE ACETATE (6 MONTH) 45 MG ~~LOC~~ KIT
45.0000 mg | PACK | SUBCUTANEOUS | Status: DC
Start: 2012-06-11 — End: 2012-06-11
  Administered 2012-06-11: 45 mg via SUBCUTANEOUS
  Filled 2012-06-11: qty 45

## 2012-06-11 MED ORDER — LEUPROLIDE ACETATE (4 MONTH) 30 MG IM KIT: 45.0000 mg | PACK | Freq: Once | INTRAMUSCULAR | Status: DC

## 2012-06-11 NOTE — Progress Notes (Signed)
In clinic for eligard45 mg .  Given subcutaneous to lower right abdominal tissue. Tolerated well.

## 2012-06-23 ENCOUNTER — Encounter (HOSPITAL_BASED_OUTPATIENT_CLINIC_OR_DEPARTMENT_OTHER): Payer: Medicare Other

## 2012-06-23 VITALS — BP 129/76 | HR 60

## 2012-06-23 DIAGNOSIS — C7951 Secondary malignant neoplasm of bone: Secondary | ICD-10-CM

## 2012-06-23 DIAGNOSIS — C7952 Secondary malignant neoplasm of bone marrow: Secondary | ICD-10-CM

## 2012-06-23 DIAGNOSIS — C61 Malignant neoplasm of prostate: Secondary | ICD-10-CM

## 2012-06-23 LAB — COMPREHENSIVE METABOLIC PANEL
ALT: 8 U/L (ref 0–53)
AST: 19 U/L (ref 0–37)
Albumin: 4.3 g/dL (ref 3.5–5.2)
Alkaline Phosphatase: 52 U/L (ref 39–117)
BUN: 13 mg/dL (ref 6–23)
CO2: 26 mEq/L (ref 19–32)
Calcium: 9.1 mg/dL (ref 8.4–10.5)
Chloride: 103 mEq/L (ref 96–112)
Creatinine, Ser: 0.95 mg/dL (ref 0.50–1.35)
GFR calc Af Amer: >90 mL/min (ref >90)
GFR calc non Af Amer: >90 mL/min (ref >90)
Glucose, Bld: 93 mg/dL (ref 70–99)
Potassium: 3.6 mEq/L (ref 3.5–5.1)
Sodium: 140 mEq/L (ref 135–145)
Total Bilirubin: 0.5 mg/dL (ref 0.3–1.2)
Total Protein: 7.3 g/dL (ref 6.0–8.3)

## 2012-06-23 LAB — DIFFERENTIAL
Basophils Absolute: 0 10*3/uL (ref 0.0–0.1)
Basophils Relative: 1 % (ref 0–1)
Eosinophils Absolute: 0.2 10*3/uL (ref 0.0–0.7)
Eosinophils Relative: 3 % (ref 0–5)
Lymphocytes Relative: 36 % (ref 12–46)
Lymphs Abs: 2.7 10*3/uL (ref 0.7–4.0)
Monocytes Absolute: 0.6 10*3/uL (ref 0.1–1.0)
Monocytes Relative: 8 % (ref 3–12)
Neutro Abs: 3.9 10*3/uL (ref 1.7–7.7)
Neutrophils Relative %: 53 % (ref 43–77)

## 2012-06-23 LAB — CBC
HCT: 36.1 % — ABNORMAL LOW (ref 39.0–52.0)
Hemoglobin: 12.2 g/dL — ABNORMAL LOW (ref 13.0–17.0)
MCH: 30.3 pg (ref 26.0–34.0)
MCHC: 33.8 g/dL (ref 30.0–36.0)
MCV: 89.8 fL (ref 78.0–100.0)
Platelets: 266 10*3/uL (ref 150–400)
RBC: 4.02 MIL/uL — ABNORMAL LOW (ref 4.22–5.81)
RDW: 14.1 % (ref 11.5–15.5)
WBC: 7.5 10*3/uL (ref 4.0–10.5)

## 2012-06-23 LAB — PSA: PSA: <0.01 ng/mL — ABNORMAL LOW (ref <=4.00)

## 2012-06-23 MED ORDER — OXYCODONE HCL 20 MG PO TB12: 20.0000 mg | ORAL_TABLET | Freq: Three times a day (TID) | ORAL | Status: DC

## 2012-06-23 MED ORDER — OXYCODONE-ACETAMINOPHEN 10-325 MG PO TABS: 1.0000 | ORAL_TABLET | Freq: Four times a day (QID) | ORAL | Status: DC | PRN

## 2012-06-23 MED ORDER — DENOSUMAB 120 MG/1.7ML ~~LOC~~ SOLN
120.0000 mg | Freq: Once | SUBCUTANEOUS | Status: AC
  Administered 2012-06-23: 120 mg via SUBCUTANEOUS
  Filled 2012-06-23: qty 1.7

## 2012-06-23 NOTE — Progress Notes (Signed)
Tolerated xgeva injection to lower left abd tissue well.

## 2012-07-01 ENCOUNTER — Other Ambulatory Visit (HOSPITAL_COMMUNITY): Payer: Medicare Other

## 2012-07-02 ENCOUNTER — Other Ambulatory Visit (HOSPITAL_COMMUNITY): Payer: Medicare Other

## 2012-07-04 ENCOUNTER — Encounter (HOSPITAL_COMMUNITY): Payer: Self-pay | Admitting: Oncology

## 2012-07-04 ENCOUNTER — Encounter (HOSPITAL_BASED_OUTPATIENT_CLINIC_OR_DEPARTMENT_OTHER): Payer: Medicare Other | Admitting: Oncology

## 2012-07-04 ENCOUNTER — Ambulatory Visit (HOSPITAL_COMMUNITY)
Admission: RE | Admit: 2012-07-04 | Discharge: 2012-07-04 | Disposition: A | Discharge status: Home / Self Care | Payer: Medicare Other | Source: Ambulatory Visit | Attending: Oncology | Admitting: Oncology

## 2012-07-04 VITALS — BP 138/96 | HR 81 | Temp 97.5°F | Resp 18 | Wt 165.2 lb

## 2012-07-04 DIAGNOSIS — Z23 Encounter for immunization: Secondary | ICD-10-CM

## 2012-07-04 DIAGNOSIS — C7951 Secondary malignant neoplasm of bone: Secondary | ICD-10-CM

## 2012-07-04 DIAGNOSIS — M545 Low back pain, unspecified: Secondary | ICD-10-CM | POA: Insufficient documentation

## 2012-07-04 DIAGNOSIS — Z0000 Encounter for general adult medical examination without abnormal findings: Secondary | ICD-10-CM

## 2012-07-04 DIAGNOSIS — M899 Disorder of bone, unspecified: Secondary | ICD-10-CM

## 2012-07-04 DIAGNOSIS — C61 Malignant neoplasm of prostate: Secondary | ICD-10-CM

## 2012-07-04 DIAGNOSIS — M5137 Other intervertebral disc degeneration, lumbosacral region: Secondary | ICD-10-CM | POA: Insufficient documentation

## 2012-07-04 DIAGNOSIS — M549 Dorsalgia, unspecified: Secondary | ICD-10-CM

## 2012-07-04 DIAGNOSIS — Z8546 Personal history of malignant neoplasm of prostate: Secondary | ICD-10-CM | POA: Insufficient documentation

## 2012-07-04 DIAGNOSIS — M51379 Other intervertebral disc degeneration, lumbosacral region without mention of lumbar back pain or lower extremity pain: Secondary | ICD-10-CM | POA: Insufficient documentation

## 2012-07-04 DIAGNOSIS — M47817 Spondylosis without myelopathy or radiculopathy, lumbosacral region: Secondary | ICD-10-CM | POA: Insufficient documentation

## 2012-07-04 MED ORDER — INFLUENZA VIRUS VACC SPLIT PF IM SUSP
0.5000 mL | Freq: Once | INTRAMUSCULAR | Status: AC
  Administered 2012-07-04: 0.5 mL via INTRAMUSCULAR

## 2012-07-04 MED ORDER — INFLUENZA VIRUS VACC SPLIT PF IM SUSP
INTRAMUSCULAR | Status: AC
  Filled 2012-07-04: qty 0.5

## 2012-07-04 NOTE — Patient Instructions (Addendum)
[facility] Discharge Instructions  RECOMMENDATIONS MADE BY THE CONSULTANT AND ANY TEST RESULTS WILL BE SENT TO YOUR REFERRING PHYSICIAN.  EXAM FINDINGS BY THE PHYSICIAN TODAY AND SIGNS OR SYMPTOMS TO REPORT TO CLINIC OR PRIMARY PHYSICIAN: exam and discussion by PA.  Will do xrays of your lumbar spine to see if there is anything going on.  MEDICATIONS PRESCRIBED:  none  INSTRUCTIONS GIVEN AND DISCUSSED: Report increased pain or other problems.  SPECIAL INSTRUCTIONS/FOLLOW-UP: Return as scheduled for Prolia and to see MD in follow-up in 3 months.  Thank you for choosing [facility] to provide your oncology and hematology care.  To afford each patient quality time with our providers, please arrive at least 15 minutes before your scheduled appointment time.  With your help, our goal is to use those 15 minutes to complete the necessary work-up to ensure our physicians have the information they need to help with your evaluation and healthcare recommendations.    Effective January 1st, 2014, we ask that you re-schedule your appointment with our physicians should you arrive 10 or more minutes late for your appointment.  We strive to give you quality time with our providers, and arriving late affects you and other patients whose appointments are after yours.    Again, thank you for choosing [facility].  Our hope is that these requests will decrease the amount of time that you wait before being seen by our physicians.       _____________________________________________________________  Should you have questions after your visit to [facility], please contact our office at [phone] between the hours of 8:30 a.m. and 5:00 p.m.  Voicemails left after 4:30 p.m. will not be returned until the following business day.  For prescription refill requests, have your pharmacy contact our office with your prescription refill request.

## 2012-07-04 NOTE — Progress Notes (Signed)
Rickie E Zuluaga presents today for injection per MD orders. Flu Vaccine administered IM in left Upper Arm. Administration without incident. Patient tolerated well.  

## 2012-07-04 NOTE — Progress Notes (Addendum)
[name] Asa, MD [address] [place] [address]  1. Prostate ca    2. Bone metastases    3. Prostate cancer    4. Back pain  DG Lumbar Spine Complete  5. Preventative health care  influenza  inactive virus vaccine (FLUZONE/FLUARIX) injection 0.5 mL    CURRENT THERAPY: Eligard 45 mg every 6 months and Xgeva every 28 days  INTERVAL HISTORY: [name] [name] 57 y.o. 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 Margo Aye in [facility]. He was then of lymph node involvement but bone scan was negative. He has been on Depo-Lupron in the past and now Eligard 45 mg every 6 months and Xgeva every 28 days for bone protection.  [name] [name] reports that is Percocet is not working well anymore.  He continues to take the medication.  His Long-acting narcotic was increased a few months ago.  Discussion with Dr. Mariel Sleet reveals that he does not wish to increase his pain medication any further.  [name] [name] reports that he was reading on the Internet about "Roxicet."  He asked to be switched to that medication.  I educated him that Roxicet is the same as Percocet as far as ingredients.  I will not change his medications.   He reports that it is low back pain that started a few months ago after he injured himself helping some with a tree. Since then his pain has been worse.   On physical exam of back I feel a suspected lipoma deep on the right side 1 inch lateral to the midline of spine.  It is tender to the touch.  It measures about 0.5 cm.  It is mobile.  I educated the patient that we will not increase his pain medications today.  But I will get an Xray of lumbar spine to evaluate for boney metastatic disease.  Otherwise, oncologically, he denies any complaints and ROS questioning is negative.     Past Medical History  Diagnosis Date  . Coronary artery disease     DES to LAD 1/12  .  COPD (chronic obstructive pulmonary disease)   . Sigmoid diverticulosis   . DDD (degenerative disc disease)   . Anxiety   . Depression   . Psoriasis   . Tubular adenoma     Colonoscopy 1/12  . Horseshoe kidney     Single kidney  . Prostate cancer     Metastatic, stage IV  - Dr. Mariel Sleet  . Internal hemorrhoids     Colonoscopy 1/12  . Drug reaction     Pamidronate - induced skin toxicity, Zometa - induced toxicity  . GERD (gastroesophageal reflux disease)   . Gastritis   . Prostate ca 12/29/2010  . Bone metastases 12/29/2010  . Osteoarthritis     has GERD; RECTAL BLEEDING; CORONARY ATHEROSCLEROSIS NATIVE CORONARY ARTERY; Mixed hyperlipidemia; Prostate ca; Bone metastases; and Prostate cancer on his problem list.     is allergic to zoledronic acid.  [name] [name] had no medications administered during this visit.  Past Surgical History  Procedure Date  . Anterior fusion cervical spine   . Posterior fusion cervical spine   . Left foot surgery   . Prostatectomy   . Appendectomy   . Heart stents 06/2010    2    Denies any headaches, dizziness, double vision, fevers, chills, night sweats, nausea, vomiting, diarrhea, constipation, chest pain, heart  palpitations, shortness of breath, blood in stool, black tarry stool, urinary pain, urinary burning, urinary frequency, hematuria.   PHYSICAL EXAMINATION  ECOG PERFORMANCE STATUS: 1 - Symptomatic but completely ambulatory  Filed Vitals:   07/04/12 1010  BP: 138/96  Pulse: 81  Temp: 97.5 F (36.4 C)  Resp: 18    GENERAL:alert, no distress, comfortable, cooperative and smiling, chronically ill-appearing.  Difficult to keep patient on task. SKIN: skin color, texture, turgor are normal, no rashes or significant lesions HEAD: Normocephalic, No masses, lesions, tenderness or abnormalities EYES: normal, Conjunctiva are pink and non-injected EARS: External ears normal OROPHARYNX:mucous membranes are moist  NECK: supple, trachea  midline, Left anterior chain lymph node measuring 1-1.5 cm in size.  No induration noted.  LYMPH:  Lymph nodes as described BREAST:not examined LUNGS: clear to auscultation and percussion HEART: regular rate & rhythm, no murmurs, no gallops, S1 normal and S2 normal ABDOMEN:abdomen soft, non-tender and normal bowel sounds BACK: Back symmetric, no curvature., No CVA tenderness, No spinal tenderness, Small right nodule measuring about 0.5 cm in size, easily mobile, tender.  No abnormalities noted on inspection.  Deep.  Near the PSIS EXTREMITIES:less then 2 second capillary refill, no joint deformities, effusion, or inflammation, no edema, no skin discoloration, no clubbing, no cyanosis  NEURO: alert & oriented x 3 with fluent speech, no focal motor/sensory deficits, gait normal    LABORATORY DATA: CBC    Component Value Date/Time   WBC 7.5 06/23/2012 1055   RBC 4.02* 06/23/2012 1055   HGB 12.2* 06/23/2012 1055   HCT 36.1* 06/23/2012 1055   PLT 266 06/23/2012 1055   MCV 89.8 06/23/2012 1055   MCH 30.3 06/23/2012 1055   MCHC 33.8 06/23/2012 1055   RDW 14.1 06/23/2012 1055   LYMPHSABS 2.7 06/23/2012 1055   MONOABS 0.6 06/23/2012 1055   EOSABS 0.2 06/23/2012 1055   BASOSABS 0.0 06/23/2012 1055      Chemistry      Component Value Date/Time   NA 140 06/23/2012 1055   K 3.6 06/23/2012 1055   CL 103 06/23/2012 1055   CO2 26 06/23/2012 1055   BUN 13 06/23/2012 1055   CREATININE 0.95 06/23/2012 1055      Component Value Date/Time   CALCIUM 9.1 06/23/2012 1055   ALKPHOS 52 06/23/2012 1055   AST 19 06/23/2012 1055   ALT 8 06/23/2012 1055   BILITOT 0.5 06/23/2012 1055     Lab Results  Component Value Date   PSA <0.01* 06/23/2012   PSA <0.01* 04/28/2012   PSA <0.01* 03/28/2012      ASSESSMENT:  1. 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 Margo Aye in [facility]. He was then of lymph node involvement but bone scan was  negative. He has been on Depo-Lupron in the past and now Eligard 45 mg (given on 06/11/12) every 6 months and Xgeva every 28 days (given on 06/23/12). 2. 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 [name]  3. Anxiety and depression much improved  4. Weakness and fatigue much improved  5. Heart disease status post cardiac catheterization on day January 25th 2012 followed by stent placement February 2012. 6. Back pain 7. Right nodule 1 inch lateral to lumbar spine, mobile, tender, thought to be a lipoma or sebaceous cyst.  It is deep. 8. Left neck lymadenopathy  PLAN:  1. I personally reviewed and went over laboratory results with the patient. 2. I personally reviewed and went over radiographic studies with the patient. 3. Continue Eligard every 6 months.  Just given on 1/8 4. Continue Xgeva every 28 days.  Just given on 1/20 5. Lumbar Xray today, evaluate for metastatic lesion. 6. Will continue to monitor lymphadenopathy 7. Return in 3 months for follow-up.  No new medications today nor an increase in pain medication. 8. Flu shot today   All questions were answered. The patient knows to call the clinic with any problems, questions or concerns. We can certainly see the patient much sooner if necessary.  The patient and plan discussed with Glenford Peers, MD and he is in agreement with the aforementioned.  KEFALAS,THOMAS

## 2012-07-04 NOTE — Addendum Note (Signed)
Addended by: Ellouise Newer on: 07/04/2012 12:29 PM   Modules accepted: Orders

## 2012-07-09 IMAGING — CR DG CHEST 1V PORT
1 series · 1 of 1 positions shown · non-contrast
Comparison: Portable exam 6373 hours compared to 12/10/2006

CLINICAL DATA: Chest pain, left side tingling and numbness, history
MI

PORTABLE CHEST - 1 VIEW

[view not recorded]
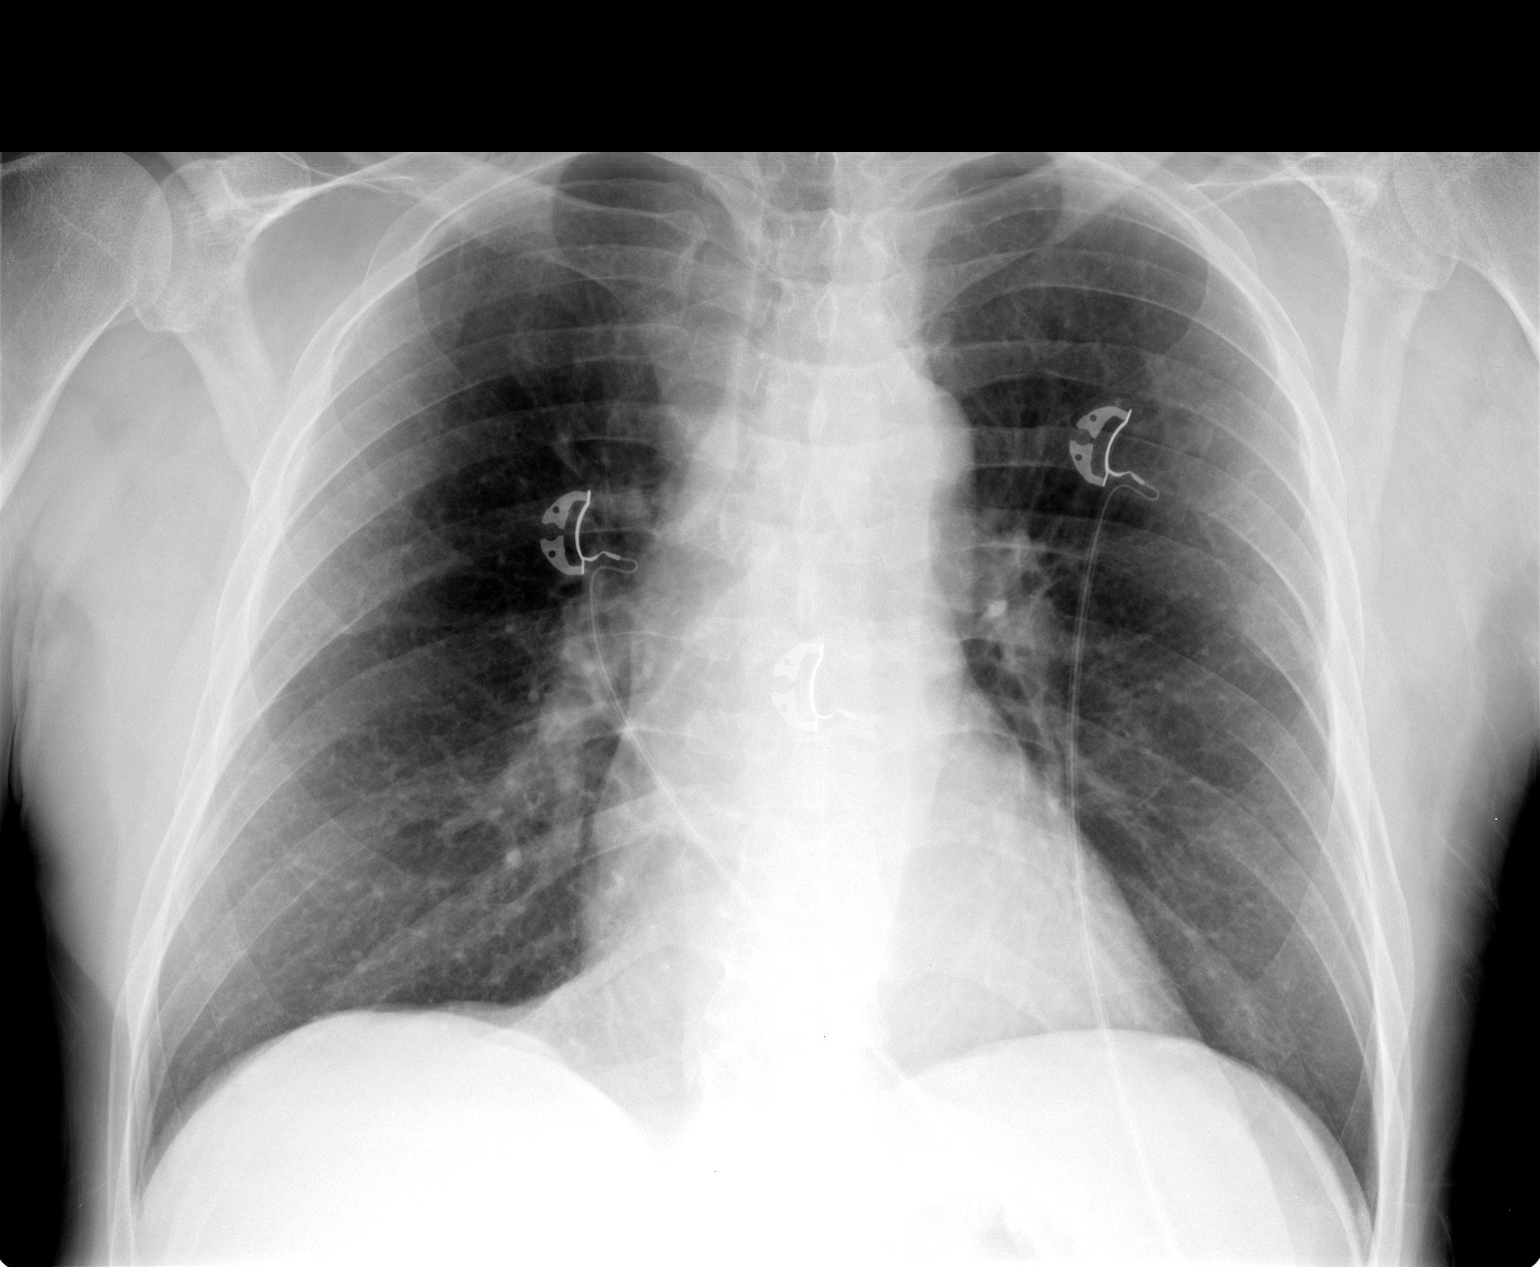

[1 of 1 positions shown; findings below may reference images not displayed]

FINDINGS: Normal heart size, mediastinal contours, and pulmonary vascularity.
Minimal chronic bronchitic changes.
Lungs otherwise clear.
No pleural effusion or pneumothorax.
Prior cervical spine fusion.
IMPRESSION: Minimal chronic bronchitic changes.
No acute abnormalities.

## 2012-07-09 IMAGING — CR DG HIP (WITH OR WITHOUT PELVIS) 2-3V*L*
3 series · 3 of 3 positions shown · non-contrast
Comparison: None

CLINICAL DATA: Left hip pain, metastatic prostate cancer

LEFT HIP - COMPLETE 2+ VIEW

[view not recorded (1 of 3)]
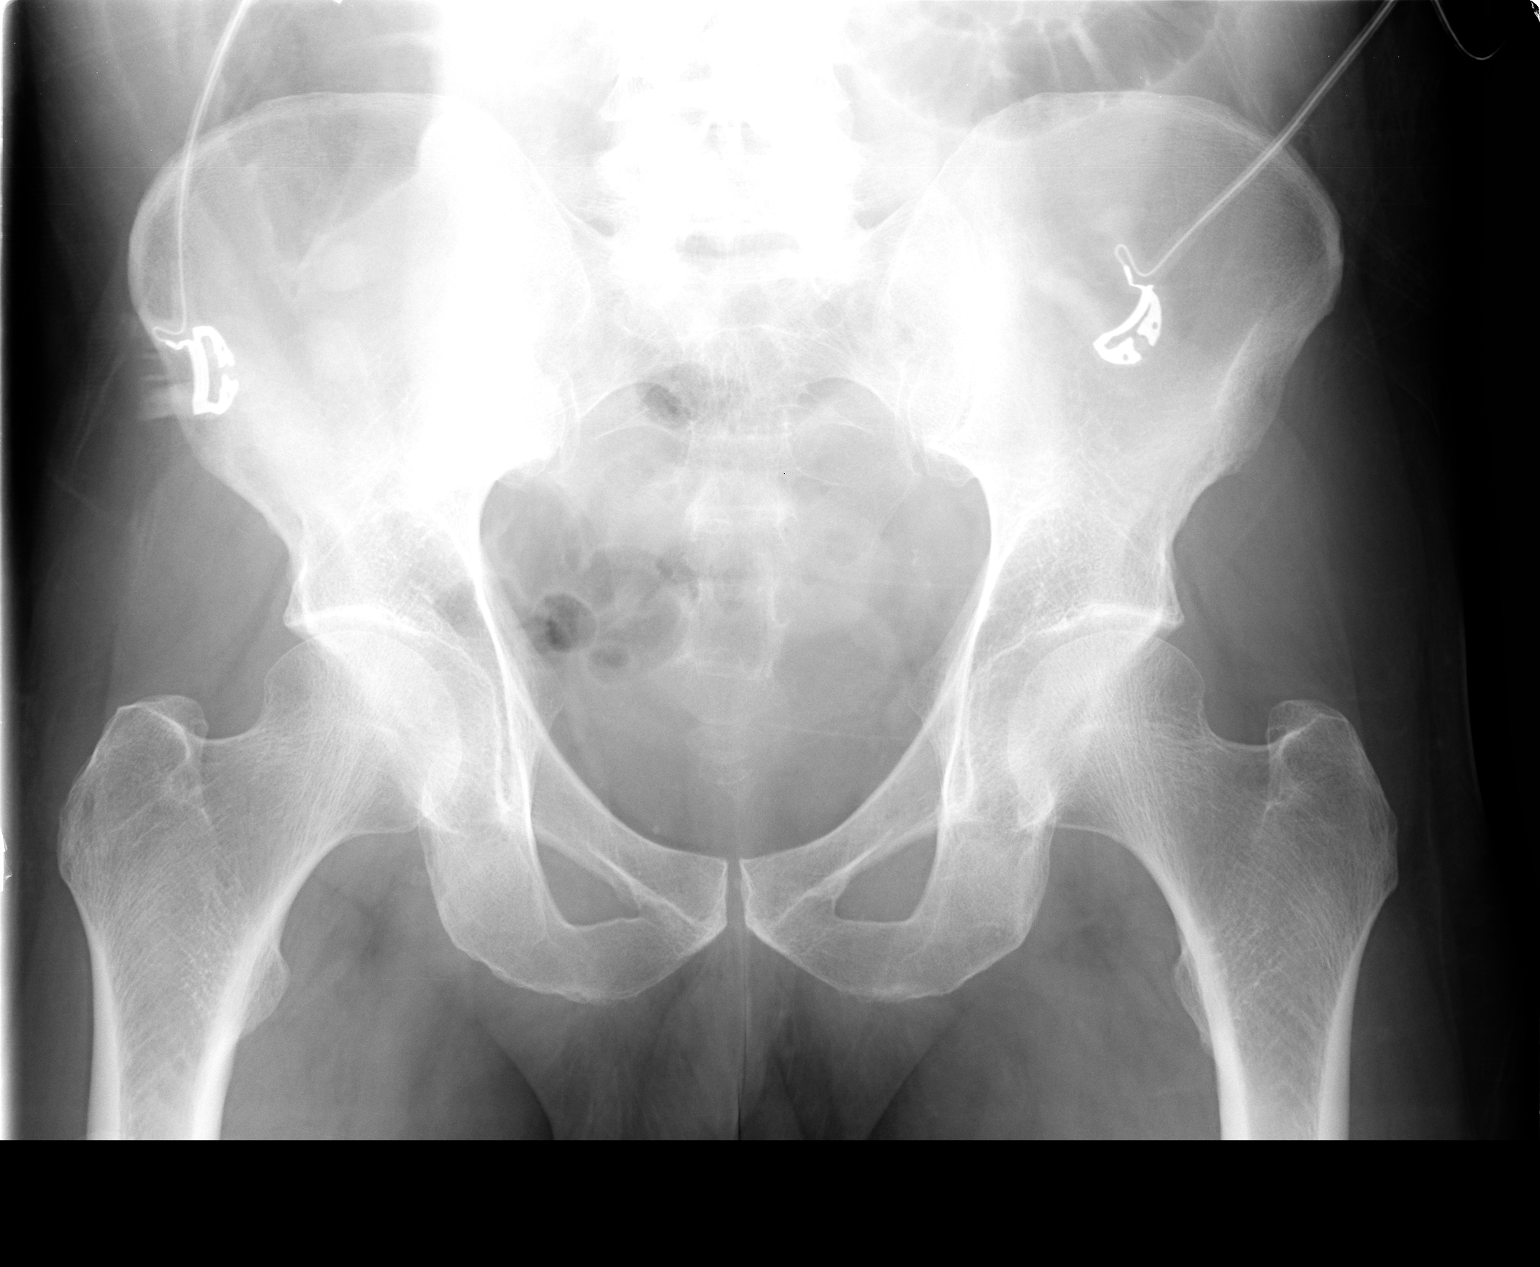

[view not recorded (2 of 3)]
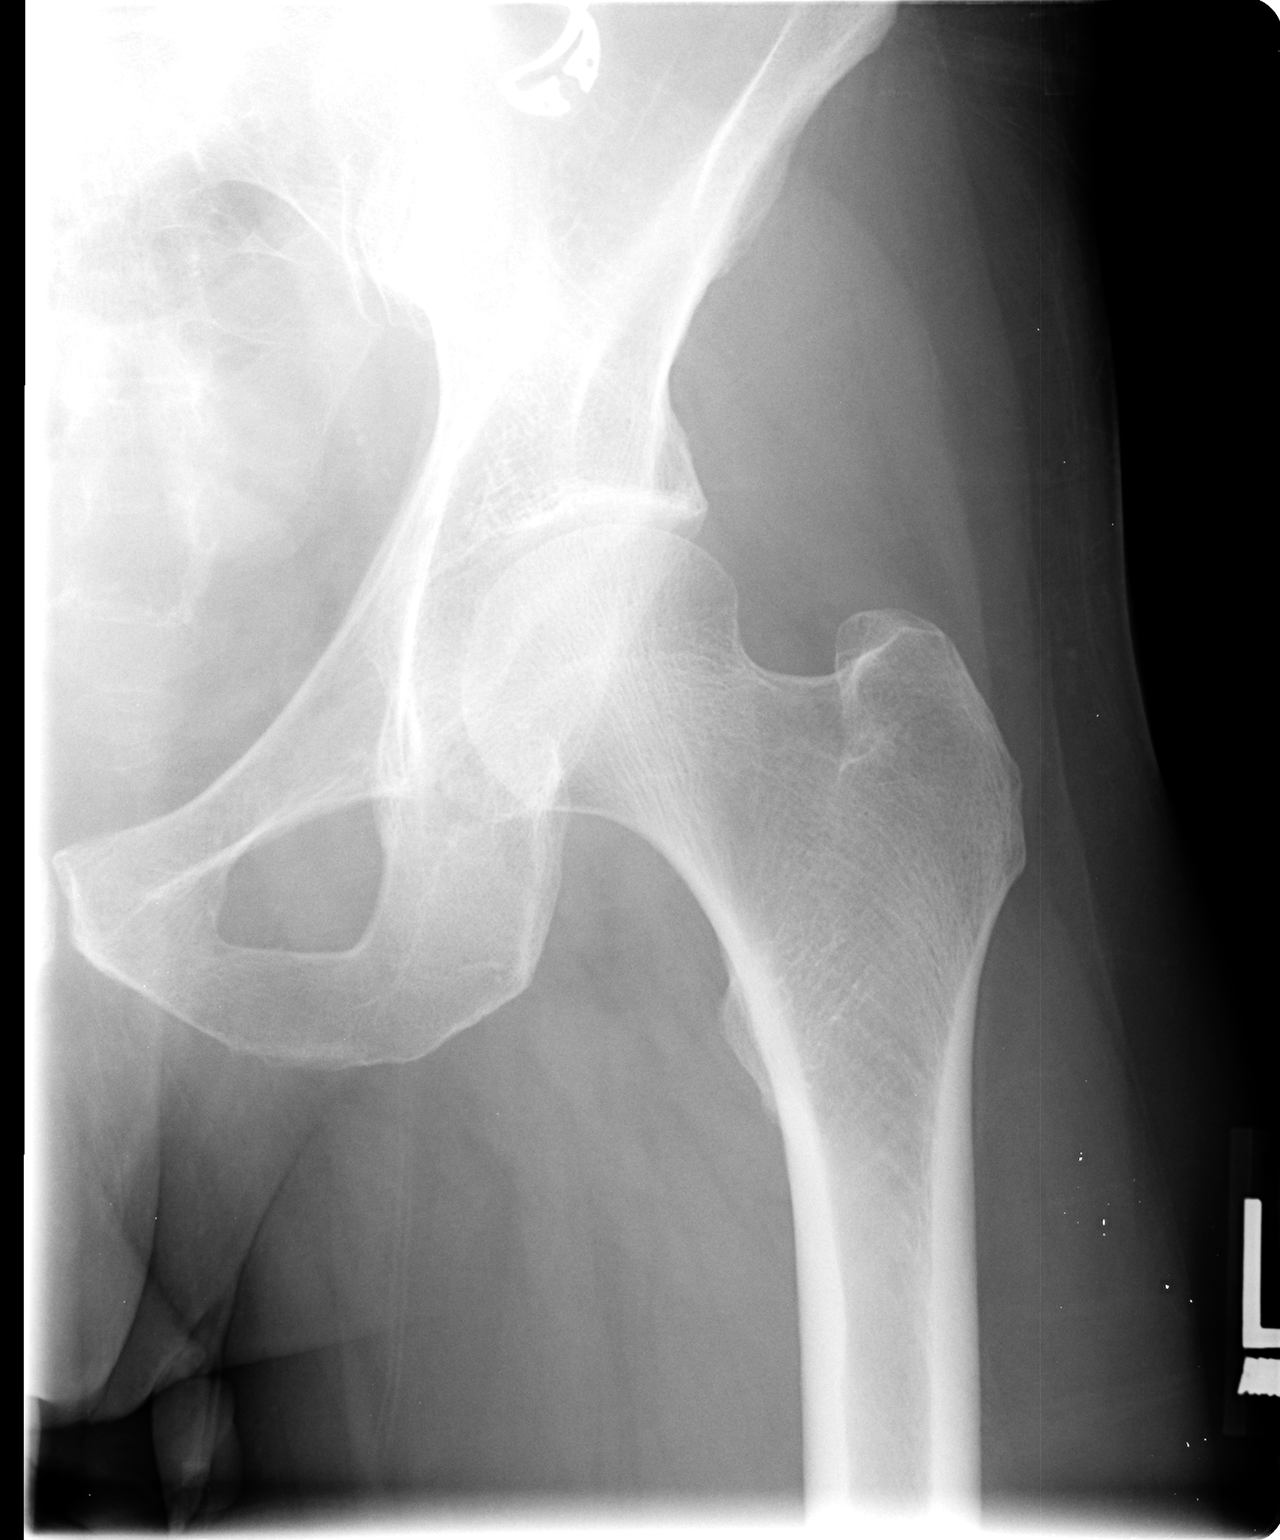

[view not recorded (3 of 3)]
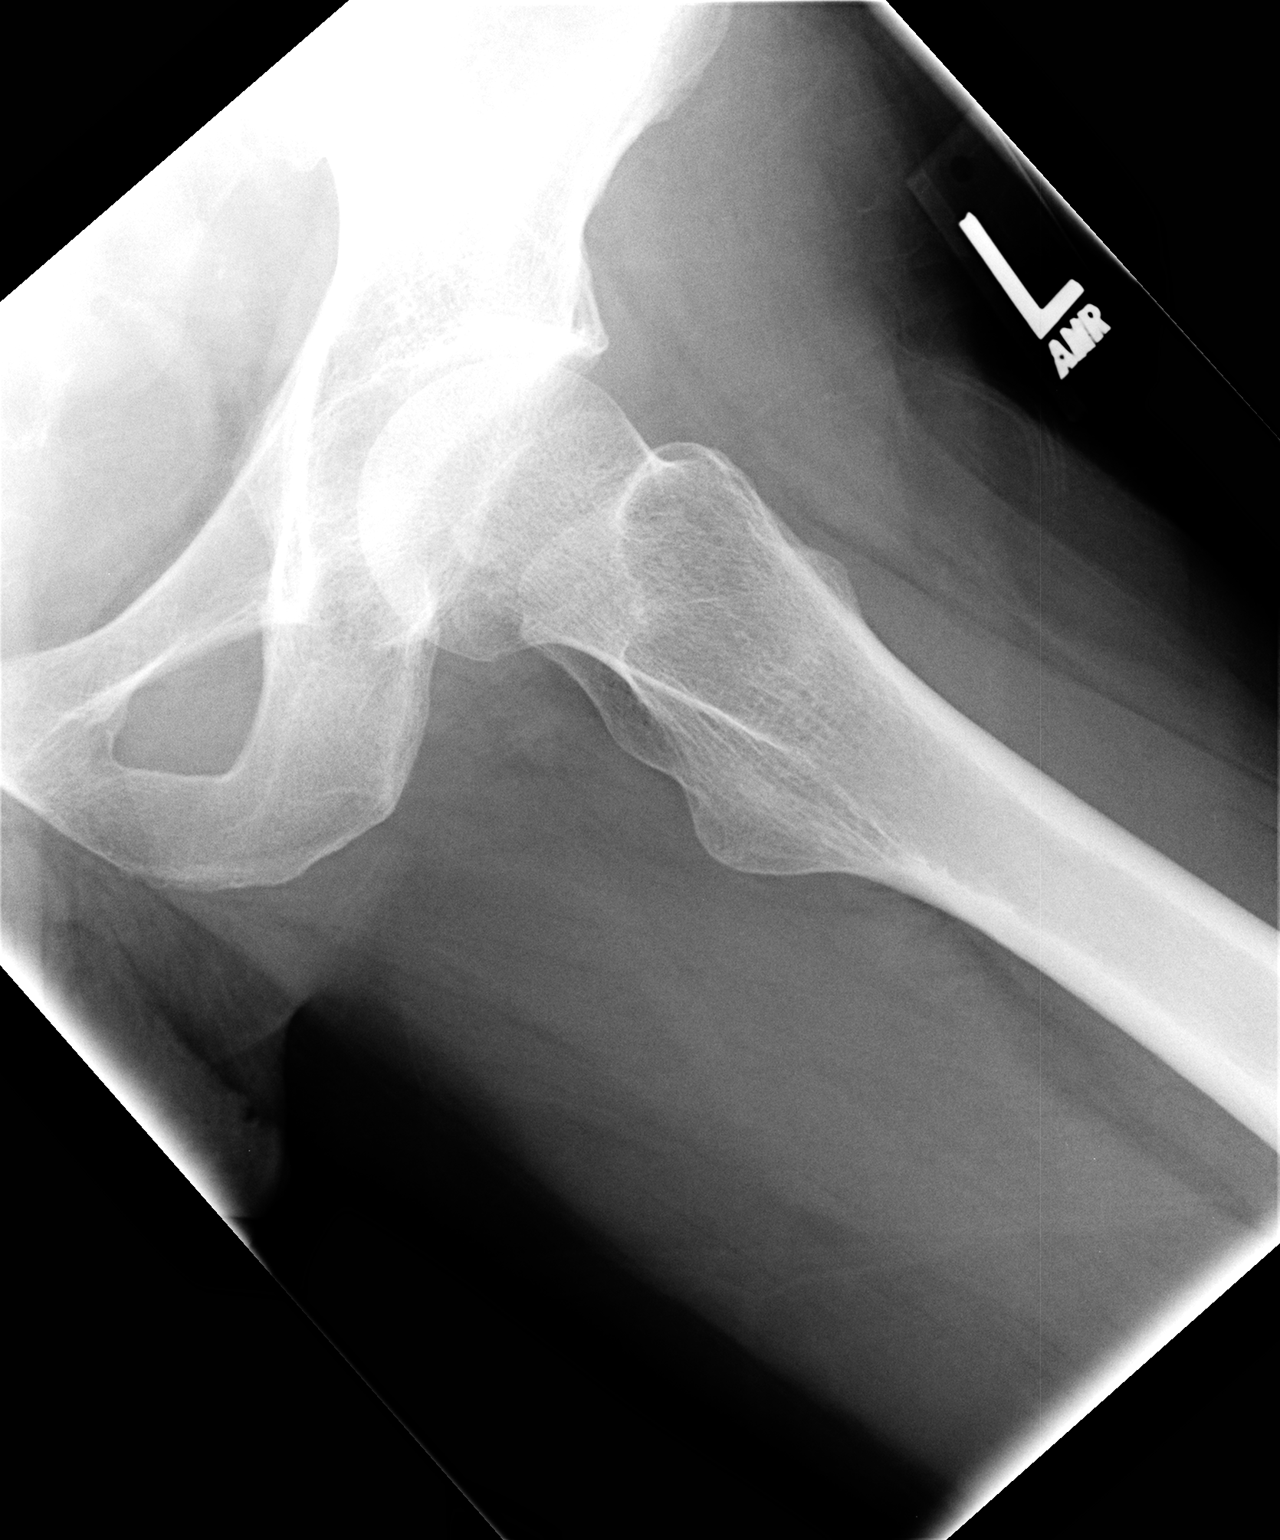

[3 of 3 positions shown; findings below may reference images not displayed]

FINDINGS: Diffuse osseous demineralization.
Symmetric hip and SI joints.
No acute fracture, dislocation, or bone destruction.
No sclerotic foci are identified to suggest sclerotic osseous
metastatic disease.
IMPRESSION: Osseous demineralization.
No acute abnormalities.

## 2012-07-21 ENCOUNTER — Other Ambulatory Visit (HOSPITAL_COMMUNITY): Payer: Self-pay | Admitting: Oncology

## 2012-07-21 ENCOUNTER — Encounter (HOSPITAL_COMMUNITY): Payer: Medicare Other | Attending: Oncology

## 2012-07-21 ENCOUNTER — Telehealth (HOSPITAL_COMMUNITY): Payer: Self-pay | Admitting: Oncology

## 2012-07-21 VITALS — BP 134/77 | HR 78 | Temp 97.7°F | Resp 18

## 2012-07-21 DIAGNOSIS — C7951 Secondary malignant neoplasm of bone: Secondary | ICD-10-CM | POA: Insufficient documentation

## 2012-07-21 DIAGNOSIS — C61 Malignant neoplasm of prostate: Secondary | ICD-10-CM

## 2012-07-21 DIAGNOSIS — C801 Malignant (primary) neoplasm, unspecified: Secondary | ICD-10-CM | POA: Insufficient documentation

## 2012-07-21 DIAGNOSIS — C7952 Secondary malignant neoplasm of bone marrow: Secondary | ICD-10-CM | POA: Insufficient documentation

## 2012-07-21 LAB — COMPREHENSIVE METABOLIC PANEL
ALT: 9 U/L (ref 0–53)
AST: 18 U/L (ref 0–37)
Albumin: 4.1 g/dL (ref 3.5–5.2)
Alkaline Phosphatase: 68 U/L (ref 39–117)
BUN: 12 mg/dL (ref 6–23)
CO2: 29 mEq/L (ref 19–32)
Calcium: 9.1 mg/dL (ref 8.4–10.5)
Chloride: 100 mEq/L (ref 96–112)
Creatinine, Ser: 1.18 mg/dL (ref 0.50–1.35)
GFR calc Af Amer: 77 mL/min — ABNORMAL LOW (ref >90)
GFR calc non Af Amer: 66 mL/min — ABNORMAL LOW (ref >90)
Glucose, Bld: 95 mg/dL (ref 70–99)
Potassium: 4.1 mEq/L (ref 3.5–5.1)
Sodium: 137 mEq/L (ref 135–145)
Total Bilirubin: 0.3 mg/dL (ref 0.3–1.2)
Total Protein: 7.1 g/dL (ref 6.0–8.3)

## 2012-07-21 MED ORDER — OXYCODONE HCL 20 MG PO TB12: 20.0000 mg | ORAL_TABLET | Freq: Three times a day (TID) | ORAL | Status: DC

## 2012-07-21 MED ORDER — OXYCODONE-ACETAMINOPHEN 10-325 MG PO TABS: 1.0000 | ORAL_TABLET | Freq: Four times a day (QID) | ORAL | Status: DC | PRN

## 2012-07-21 MED ORDER — ALPRAZOLAM 0.5 MG PO TABS: ORAL_TABLET | ORAL | Status: DC

## 2012-07-21 MED ORDER — DENOSUMAB 120 MG/1.7ML ~~LOC~~ SOLN
120.0000 mg | Freq: Once | SUBCUTANEOUS | Status: AC
  Administered 2012-07-21: 120 mg via SUBCUTANEOUS
  Filled 2012-07-21: qty 1.7

## 2012-07-21 NOTE — Progress Notes (Signed)
Denosumab 120 mg given subcutaneous to lower abd fat.  Tolerated well.

## 2012-07-30 ENCOUNTER — Other Ambulatory Visit (HOSPITAL_COMMUNITY): Payer: Self-pay | Admitting: Oncology

## 2012-07-30 DIAGNOSIS — C61 Malignant neoplasm of prostate: Secondary | ICD-10-CM

## 2012-08-04 ENCOUNTER — Telehealth: Payer: Self-pay | Admitting: *Deleted

## 2012-08-04 ENCOUNTER — Encounter: Payer: Self-pay | Admitting: *Deleted

## 2012-08-04 ENCOUNTER — Ambulatory Visit (HOSPITAL_COMMUNITY): Payer: Medicare Other | Admitting: Oncology

## 2012-08-04 DIAGNOSIS — I10 Essential (primary) hypertension: Secondary | ICD-10-CM

## 2012-08-04 DIAGNOSIS — E782 Mixed hyperlipidemia: Secondary | ICD-10-CM

## 2012-08-04 NOTE — Telephone Encounter (Signed)
Mailed reminder letter to have labs drawn prior to follow up OV

## 2012-08-18 ENCOUNTER — Ambulatory Visit (HOSPITAL_COMMUNITY): Payer: Medicare Other

## 2012-08-19 ENCOUNTER — Encounter (HOSPITAL_COMMUNITY): Payer: Self-pay | Admitting: Oncology

## 2012-08-19 ENCOUNTER — Encounter (HOSPITAL_COMMUNITY): Payer: Medicare Other

## 2012-08-19 ENCOUNTER — Encounter (HOSPITAL_COMMUNITY): Payer: Medicare Other | Attending: Oncology | Admitting: Oncology

## 2012-08-19 VITALS — BP 148/98 | HR 71 | Temp 97.8°F | Resp 18 | Wt 163.0 lb

## 2012-08-19 DIAGNOSIS — R599 Enlarged lymph nodes, unspecified: Secondary | ICD-10-CM

## 2012-08-19 DIAGNOSIS — C7951 Secondary malignant neoplasm of bone: Secondary | ICD-10-CM | POA: Insufficient documentation

## 2012-08-19 DIAGNOSIS — C801 Malignant (primary) neoplasm, unspecified: Secondary | ICD-10-CM | POA: Insufficient documentation

## 2012-08-19 DIAGNOSIS — C61 Malignant neoplasm of prostate: Secondary | ICD-10-CM | POA: Insufficient documentation

## 2012-08-19 DIAGNOSIS — M549 Dorsalgia, unspecified: Secondary | ICD-10-CM

## 2012-08-19 DIAGNOSIS — C7952 Secondary malignant neoplasm of bone marrow: Secondary | ICD-10-CM

## 2012-08-19 LAB — CBC WITH DIFFERENTIAL/PLATELET
Basophils Absolute: 0 10*3/uL (ref 0.0–0.1)
Basophils Relative: 0 % (ref 0–1)
Eosinophils Absolute: 0.1 10*3/uL (ref 0.0–0.7)
Eosinophils Relative: 2 % (ref 0–5)
HCT: 36.4 % — ABNORMAL LOW (ref 39.0–52.0)
Hemoglobin: 12.5 g/dL — ABNORMAL LOW (ref 13.0–17.0)
Lymphocytes Relative: 26 % (ref 12–46)
Lymphs Abs: 1.8 10*3/uL (ref 0.7–4.0)
MCH: 30.8 pg (ref 26.0–34.0)
MCHC: 34.3 g/dL (ref 30.0–36.0)
MCV: 89.7 fL (ref 78.0–100.0)
Monocytes Absolute: 0.5 10*3/uL (ref 0.1–1.0)
Monocytes Relative: 7 % (ref 3–12)
Neutro Abs: 4.4 10*3/uL (ref 1.7–7.7)
Neutrophils Relative %: 65 % (ref 43–77)
Platelets: 312 10*3/uL (ref 150–400)
RBC: 4.06 MIL/uL — ABNORMAL LOW (ref 4.22–5.81)
RDW: 14.3 % (ref 11.5–15.5)
WBC: 6.8 10*3/uL (ref 4.0–10.5)

## 2012-08-19 LAB — COMPREHENSIVE METABOLIC PANEL
ALT: 11 U/L (ref 0–53)
AST: 19 U/L (ref 0–37)
Albumin: 4.4 g/dL (ref 3.5–5.2)
Alkaline Phosphatase: 65 U/L (ref 39–117)
BUN: 7 mg/dL (ref 6–23)
CO2: 24 mEq/L (ref 19–32)
Calcium: 8.9 mg/dL (ref 8.4–10.5)
Chloride: 102 mEq/L (ref 96–112)
Creatinine, Ser: 0.92 mg/dL (ref 0.50–1.35)
GFR calc Af Amer: >90 mL/min (ref >90)
GFR calc non Af Amer: >90 mL/min (ref >90)
Glucose, Bld: 94 mg/dL (ref 70–99)
Potassium: 4 mEq/L (ref 3.5–5.1)
Sodium: 140 mEq/L (ref 135–145)
Total Bilirubin: 0.5 mg/dL (ref 0.3–1.2)
Total Protein: 7.6 g/dL (ref 6.0–8.3)

## 2012-08-19 MED ORDER — OXYCODONE-ACETAMINOPHEN 10-325 MG PO TABS: 1.0000 | ORAL_TABLET | Freq: Four times a day (QID) | ORAL | Status: DC | PRN

## 2012-08-19 MED ORDER — OXYCODONE HCL 20 MG PO TB12: 20.0000 mg | ORAL_TABLET | Freq: Three times a day (TID) | ORAL | Status: DC

## 2012-08-19 MED ORDER — DENOSUMAB 120 MG/1.7ML ~~LOC~~ SOLN
120.0000 mg | Freq: Once | SUBCUTANEOUS | Status: AC
  Administered 2012-08-19: 120 mg via SUBCUTANEOUS
  Filled 2012-08-19: qty 1.7

## 2012-08-19 NOTE — Progress Notes (Signed)
[name] [name] presented for labwork. Labs per MD order drawn via Peripheral Line 25 gauge needle inserted in lt ac  Good blood return present. Procedure without incident.  Needle removed intact. Patient tolerated procedure well.  [name] [name] presents today for injection per MD orders. xgeva 120 mg administered SQ in right Abdomen. Administration without incident. Patient tolerated well.

## 2012-08-19 NOTE — Progress Notes (Signed)
[name] Asa, MD [address] [place] [address]  Prostate cancer - Plan: SCHEDULING COMMUNICATION, denosumab (XGEVA) injection 120 mg, Comprehensive metabolic panel  Prostate ca - Plan: SCHEDULING COMMUNICATION, denosumab (XGEVA) injection 120 mg, Comprehensive metabolic panel  Bone metastases - Plan: SCHEDULING COMMUNICATION, denosumab (XGEVA) injection 120 mg, Comprehensive metabolic panel  CURRENT THERAPY: Eligard 45 mg every 6 months and Xgeva every 28 days   INTERVAL HISTORY: [name] [name] 57 y.o. 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 Margo Aye in [facility]. He was then of lymph node involvement but bone scan was negative. He has been on Depo-Lupron in the past and now Eligard 45 mg every 6 months and Xgeva every 28 days for bone protection.   [name] [name] is doing well.  I re-evaluated his lymph nodes and everything is stable.  His left neck lymphadenopathy is stable without induration. He reports a right axillary lymph node that is not found on exam today. He is not able to locate this either.   [name] [name] is doing well.  He denies any complaints and Oncologic ROS questioning is negative.  He needs a refill on his Oxycontin and Oxycodone.  This Rx was printed and hand delivered to the patient today.  He brings in some information for me to fill out on his ex-wife, Roanna Raider is a patient of ours as well.  It is for help with her gas expenses for radiation treatment.  I filled this out and gave it back to [name] [name].   We will remain on schedule with treatment and he is due for his Xgeva injection today.   Past Medical History  Diagnosis Date  . Coronary artery disease     DES to LAD 1/12  . COPD (chronic obstructive pulmonary disease)   . Sigmoid diverticulosis   . DDD (degenerative disc disease)   . Anxiety   . Depression   . Psoriasis   .  Tubular adenoma     Colonoscopy 1/12  . Horseshoe kidney     Single kidney  . Prostate cancer     Metastatic, stage IV  - Dr. Mariel Sleet  . Internal hemorrhoids     Colonoscopy 1/12  . Drug reaction     Pamidronate - induced skin toxicity, Zometa - induced toxicity  . GERD (gastroesophageal reflux disease)   . Gastritis   . Prostate ca 12/29/2010  . Bone metastases 12/29/2010  . Osteoarthritis     has GERD; RECTAL BLEEDING; CORONARY ATHEROSCLEROSIS NATIVE CORONARY ARTERY; Mixed hyperlipidemia; Prostate ca; Bone metastases; and Prostate cancer on his problem list.     is allergic to zoledronic acid.  [name] [name] had no medications administered during this visit.  Past Surgical History  Procedure Laterality Date  . Anterior fusion cervical spine    . Posterior fusion cervical spine    . Left foot surgery    . Prostatectomy    . Appendectomy    . Heart stents  06/2010    2    Denies any headaches, dizziness, double vision, fevers, chills, night sweats, nausea, vomiting, diarrhea, constipation, chest pain, heart palpitations, shortness of breath, blood in stool, black tarry stool, urinary pain, urinary burning, urinary frequency, hematuria.   PHYSICAL EXAMINATION  ECOG PERFORMANCE STATUS: 1 - Symptomatic but completely ambulatory  Filed Vitals:   08/19/12 1346  BP: 148/98  Pulse: 71  Temp: 97.8 F (  36.6 C)  Resp: 18   GENERAL:alert, no distress, comfortable, cooperative and smiling, chronically ill-appearing. Difficult to keep patient on task.  SKIN: skin color, texture, turgor are normal, no rashes or significant lesions  HEAD: Normocephalic, No masses, lesions, tenderness or abnormalities  EYES: normal, Conjunctiva are pink and non-injected  EARS: External ears normal  OROPHARYNX:mucous membranes are moist  NECK: supple, trachea midline, Left anterior chain lymph node measuring 1-1.5 cm in size that is fixed and matted, but soft. No induration noted.  LYMPH: Lymph  nodes as described  BREAST:not examined  LUNGS: clear to auscultation and percussion  HEART: regular rate & rhythm, no murmurs, no gallops, S1 normal and S2 normal  ABDOMEN:abdomen soft, non-tender and normal bowel sounds  BACK: Back symmetric, no curvature., No CVA tenderness, No spinal tenderness, Small right nodule measuring about 0.5 cm in size, easily mobile, tender. No abnormalities noted on inspection. Deep. Near the PSIS  EXTREMITIES:less then 2 second capillary refill, no joint deformities, effusion, or inflammation, no edema, no skin discoloration, no clubbing, no cyanosis  NEURO: alert & oriented x 3 with fluent speech, no focal motor/sensory deficits, gait normal   LABORATORY DATA: CBC    Component Value Date/Time   WBC 7.5 06/23/2012 1055   RBC 4.02* 06/23/2012 1055   HGB 12.2* 06/23/2012 1055   HCT 36.1* 06/23/2012 1055   PLT 266 06/23/2012 1055   MCV 89.8 06/23/2012 1055   MCH 30.3 06/23/2012 1055   MCHC 33.8 06/23/2012 1055   RDW 14.1 06/23/2012 1055   LYMPHSABS 2.7 06/23/2012 1055   MONOABS 0.6 06/23/2012 1055   EOSABS 0.2 06/23/2012 1055   BASOSABS 0.0 06/23/2012 1055      Chemistry      Component Value Date/Time   NA 137 07/21/2012 1122   K 4.1 07/21/2012 1122   CL 100 07/21/2012 1122   CO2 29 07/21/2012 1122   BUN 12 07/21/2012 1122   CREATININE 1.18 07/21/2012 1122      Component Value Date/Time   CALCIUM 9.1 07/21/2012 1122   ALKPHOS 68 07/21/2012 1122   AST 18 07/21/2012 1122   ALT 9 07/21/2012 1122   BILITOT 0.3 07/21/2012 1122     Lab Results  Component Value Date   PSA <0.01* 06/23/2012   PSA <0.01* 04/28/2012   PSA <0.01* 03/28/2012     RADIOGRAPHIC STUDIES:  07/04/2012  *RADIOLOGY REPORT*  Clinical Data: Low back pain. Metastatic disease. History of  metastatic prostate cancer.  LUMBAR SPINE - COMPLETE 4+ VIEW  Comparison: MRI lumbar spine 12/17/2011. Radiographs 01/09/2012.  Findings: Five lumbar type vertebral bodies. No destructive or  sclerotic  osseous lesions in this patient with history of prostate  cancer. Mild dextroconvex curvature with the apex at L3.  Mild multilevel degenerative disc disease with small marginal  osteophytes and disc space narrowing. An incomplete fusion of the  posterior elements of L5. No pars defects. Alignment on the  lateral view is anatomic. Aortoiliac atherosclerosis is  incidentally noted. No fracture. Vertebral body height preserved.  IMPRESSION:  Mild lumbar spondylosis and dextroconvex curvature. No evidence of  metastatic disease.  Original Report Authenticated By: Andreas Newport, M.D.    ASSESSMENT:  1. 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 Margo Aye in [facility]. He was then of lymph node involvement but bone scan was negative. He has been on Depo-Lupron in the past and now Eligard 45 mg (given on 06/11/12) every 6  months and Xgeva every 28 days (given on 07/21/12).  2. 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 [name]  3. Anxiety and depression much improved  4. Weakness and fatigue much improved  5. Heart disease status post cardiac catheterization on day January 25th 2012 followed by stent placement February 2012.  6. Back pain  7. Right nodule 1 inch lateral to lumbar spine, mobile, tender, thought to be a lipoma or sebaceous cyst. It is deep. Stable 8. Left neck lymphadenopathy, Stable   PLAN:  1. I personally reviewed and went over laboratory results with the patient. 2. I personally reviewed and went over radiographic studies with the patient. 3. Labs every 4 weeks: CBC diff, CMET, PSA 4. Eligard every 6 months and he will be due for his next injection in July 2014 5. Xgeva today and monthly 6. Rx given for refill on Oxycontin and Oxycodone. 7. Return in 2-3 months for  follow-up  All questions were answered. The patient knows to call the clinic with any problems, questions or concerns. We can certainly see the patient much sooner if necessary.  The patient and plan discussed with Glenford Peers, MD and he is in agreement with the aforementioned.  [name] [name]

## 2012-08-19 NOTE — Patient Instructions (Addendum)
.  [facility] Discharge Instructions  RECOMMENDATIONS MADE BY THE CONSULTANT AND ANY TEST RESULTS WILL BE SENT TO YOUR REFERRING PHYSICIAN.  EXAM FINDINGS BY THE PHYSICIAN TODAY AND SIGNS OR SYMPTOMS TO REPORT TO CLINIC OR PRIMARY PHYSICIAN: Exam good today. No changes in your neck  MEDICATIONS PRESCRIBED:  Rx for pain meds given  INSTRUCTIONS GIVEN AND DISCUSSED: Labs monthly xgeva monthly  SPECIAL INSTRUCTIONS/FOLLOW-UP: 3 months to see Dr. Mariel Sleet  Thank you for choosing [facility] to provide your oncology and hematology care.  To afford each patient quality time with our providers, please arrive at least 15 minutes before your scheduled appointment time.  With your help, our goal is to use those 15 minutes to complete the necessary work-up to ensure our physicians have the information they need to help with your evaluation and healthcare recommendations.    Effective January 1st, 2014, we ask that you re-schedule your appointment with our physicians should you arrive 10 or more minutes late for your appointment.  We strive to give you quality time with our providers, and arriving late affects you and other patients whose appointments are after yours.    Again, thank you for choosing [facility].  Our hope is that these requests will decrease the amount of time that you wait before being seen by our physicians.       _____________________________________________________________  Should you have questions after your visit to [facility], please contact our office at [phone] between the hours of 8:30 a.m. and 5:00 p.m.  Voicemails left after 4:30 p.m. will not be returned until the following business day.  For prescription refill requests, have your pharmacy contact our office with your prescription refill request.

## 2012-08-20 ENCOUNTER — Other Ambulatory Visit: Payer: Self-pay

## 2012-08-20 LAB — PSA: PSA: <0.01 ng/mL — ABNORMAL LOW (ref <=4.00)

## 2012-08-20 MED ORDER — CALCIPOTRIENE-BETAMETH DIPROP 0.005-0.064 % EX OINT: 1.0000 "application " | TOPICAL_OINTMENT | Freq: Every day | CUTANEOUS | Status: DC

## 2012-08-22 ENCOUNTER — Other Ambulatory Visit (HOSPITAL_COMMUNITY): Payer: Self-pay | Admitting: Oncology

## 2012-08-22 DIAGNOSIS — J069 Acute upper respiratory infection, unspecified: Secondary | ICD-10-CM

## 2012-08-22 MED ORDER — AZITHROMYCIN 250 MG PO TABS: ORAL_TABLET | ORAL | Status: DC

## 2012-09-02 ENCOUNTER — Encounter: Payer: Medicare Other | Admitting: Adult Health

## 2012-09-02 NOTE — Patient Instructions (Signed)
Your physician recommends that you schedule a follow-up appointment in:  

## 2012-09-02 NOTE — Progress Notes (Deleted)
HPI: [name] is a 57 year old patient of Dr. Diona Browner we are following for ongoing assessment treatment of known history of CAD, hyperlipidemia, with multiple cardiovascular risk factors. Patient is also being followed by oncology for treatment of prostate cancer. He was last seen by Dr. Diona Browner in February 2013 and was doing well from a cardiac perspective.  Allergies  Allergen Reactions  . Zoledronic Acid     ELEVATED BP, DIAPHORETIC & DIZZINESS    Current Outpatient Prescriptions  Medication Sig Dispense Refill  . ALBUTEROL IN Inhale into the lungs as needed.        . ALPRAZolam (XANAX) 0.5 MG tablet TAKE (1) TABLET BY MOUTH FOUR TIMES DAILY PRN.  120 tablet  2  . aspirin 81 MG tablet Take 81 mg by mouth daily.      Marland Kitchen azithromycin (ZITHROMAX) 250 MG tablet As directed  6 each  0  . calcipotriene (DOVONOX) 0.005 % cream Apply topically 2 (two) times daily.        . calcipotriene-betamethasone (TACLONEX) ointment Apply 1 application topically daily.  60 g  3  . Calcium Carbonate-Vitamin D (CALTRATE 600+D PO) Take by mouth daily.        Marland Kitchen CAPEX 0.01 % SHAM Apply 1 application topically as needed.       Marland Kitchen CLOBEX SPRAY 0.05 % external spray Apply 5 sprays topically daily.       . diazepam (VALIUM) 10 MG tablet Take 10 mg by mouth as needed.       Clinical research associate Bandages & Supports (LUMBAR BACK BRACE/SUPPORT PAD) MISC 1 each by Does not apply route as directed.  1 each  1  . gabapentin (NEURONTIN) 300 MG capsule TAKE 3 CAPSULES BY MOUTH AT BEDTIME.  90 capsule  2  . leuprolide, 6 Month, (ELIGARD) 45 MG injection Inject 45 mg into the skin.      Marland Kitchen meclizine (ANTIVERT) 25 MG tablet Take 25 mg by mouth 3 (three) times daily as needed.      . metoprolol succinate (TOPROL-XL) 25 MG 24 hr tablet Take 25 mg by mouth daily.        Marland Kitchen NITROSTAT 0.4 MG SL tablet PLACE 1 TAB UNDER TONGUE AS DIRECTED FOR CHEST PAIN.  25 each  4  . oxyCODONE (OXYCONTIN) 20 MG 12 hr tablet Take 1 tablet (20 mg total) by  mouth every 8 (eight) hours. Take 1 TID  90 tablet  0  . oxyCODONE-acetaminophen (PERCOCET) 10-325 MG per tablet Take 1 tablet by mouth every 6 (six) hours as needed for pain.  100 tablet  0  . pantoprazole (PROTONIX) 40 MG tablet Take 40 mg by mouth daily. Taking 1 - 2 daily      . pravastatin (PRAVACHOL) 40 MG tablet Take 40 mg by mouth daily.         No current facility-administered medications for this visit.    Past Medical History  Diagnosis Date  . Coronary artery disease     DES to LAD 1/12  . COPD (chronic obstructive pulmonary disease)   . Sigmoid diverticulosis   . DDD (degenerative disc disease)   . Anxiety   . Depression   . Psoriasis   . Tubular adenoma     Colonoscopy 1/12  . Horseshoe kidney     Single kidney  . Prostate cancer     Metastatic, stage IV  - Dr. Mariel Sleet  . Internal hemorrhoids     Colonoscopy 1/12  . Drug reaction  Pamidronate - induced skin toxicity, Zometa - induced toxicity  . GERD (gastroesophageal reflux disease)   . Gastritis   . Prostate ca 12/29/2010  . Bone metastases 12/29/2010  . Osteoarthritis     Past Surgical History  Procedure Laterality Date  . Anterior fusion cervical spine    . Posterior fusion cervical spine    . Left foot surgery    . Prostatectomy    . Appendectomy    . Heart stents  06/2010    2    ROS: PHYSICAL EXAM There were no vitals taken for this visit.  EKG:  ASSESSMENT AND PLAN

## 2012-09-03 NOTE — Progress Notes (Signed)
   Patient ID: [name] [name], male    DOB: [birth year], 57 y.o.   MRN: [id number]  HPI    Review of Systems   NO SHOW Physical Exam

## 2012-09-13 ENCOUNTER — Other Ambulatory Visit: Payer: Self-pay | Admitting: Family Medicine

## 2012-09-17 ENCOUNTER — Other Ambulatory Visit (HOSPITAL_COMMUNITY): Payer: Self-pay | Admitting: Oncology

## 2012-09-17 ENCOUNTER — Telehealth (HOSPITAL_COMMUNITY): Payer: Self-pay | Admitting: Oncology

## 2012-09-17 DIAGNOSIS — C7951 Secondary malignant neoplasm of bone: Secondary | ICD-10-CM

## 2012-09-17 DIAGNOSIS — C61 Malignant neoplasm of prostate: Secondary | ICD-10-CM

## 2012-09-17 MED ORDER — OXYCODONE HCL 20 MG PO TB12: 20.0000 mg | ORAL_TABLET | Freq: Three times a day (TID) | ORAL | Status: DC

## 2012-09-17 MED ORDER — OXYCODONE-ACETAMINOPHEN 10-325 MG PO TABS: 1.0000 | ORAL_TABLET | Freq: Four times a day (QID) | ORAL | Status: DC | PRN

## 2012-09-18 ENCOUNTER — Encounter (HOSPITAL_COMMUNITY): Payer: Medicare Other | Attending: Oncology

## 2012-09-18 ENCOUNTER — Other Ambulatory Visit: Payer: Self-pay | Admitting: Family Medicine

## 2012-09-18 ENCOUNTER — Other Ambulatory Visit (HOSPITAL_COMMUNITY): Payer: Self-pay | Admitting: Oncology

## 2012-09-18 ENCOUNTER — Encounter (HOSPITAL_BASED_OUTPATIENT_CLINIC_OR_DEPARTMENT_OTHER): Payer: Medicare Other

## 2012-09-18 VITALS — BP 119/83 | HR 59

## 2012-09-18 DIAGNOSIS — C61 Malignant neoplasm of prostate: Secondary | ICD-10-CM | POA: Insufficient documentation

## 2012-09-18 DIAGNOSIS — C7951 Secondary malignant neoplasm of bone: Secondary | ICD-10-CM | POA: Insufficient documentation

## 2012-09-18 DIAGNOSIS — C801 Malignant (primary) neoplasm, unspecified: Secondary | ICD-10-CM | POA: Insufficient documentation

## 2012-09-18 DIAGNOSIS — E876 Hypokalemia: Secondary | ICD-10-CM

## 2012-09-18 DIAGNOSIS — C7952 Secondary malignant neoplasm of bone marrow: Secondary | ICD-10-CM

## 2012-09-18 LAB — CBC WITH DIFFERENTIAL/PLATELET
Basophils Absolute: 0 10*3/uL (ref 0.0–0.1)
Basophils Relative: 1 % (ref 0–1)
Eosinophils Absolute: 0.3 10*3/uL (ref 0.0–0.7)
Eosinophils Relative: 4 % (ref 0–5)
HCT: 36.3 % — ABNORMAL LOW (ref 39.0–52.0)
Hemoglobin: 12.5 g/dL — ABNORMAL LOW (ref 13.0–17.0)
Lymphocytes Relative: 35 % (ref 12–46)
Lymphs Abs: 2.3 10*3/uL (ref 0.7–4.0)
MCH: 30.9 pg (ref 26.0–34.0)
MCHC: 34.4 g/dL (ref 30.0–36.0)
MCV: 89.6 fL (ref 78.0–100.0)
Monocytes Absolute: 0.5 10*3/uL (ref 0.1–1.0)
Monocytes Relative: 8 % (ref 3–12)
Neutro Abs: 3.5 10*3/uL (ref 1.7–7.7)
Neutrophils Relative %: 53 % (ref 43–77)
Platelets: 247 10*3/uL (ref 150–400)
RBC: 4.05 MIL/uL — ABNORMAL LOW (ref 4.22–5.81)
RDW: 14.1 % (ref 11.5–15.5)
WBC: 6.6 10*3/uL (ref 4.0–10.5)

## 2012-09-18 LAB — COMPREHENSIVE METABOLIC PANEL
ALT: 10 U/L (ref 0–53)
AST: 18 U/L (ref 0–37)
Albumin: 4.1 g/dL (ref 3.5–5.2)
Alkaline Phosphatase: 51 U/L (ref 39–117)
BUN: 8 mg/dL (ref 6–23)
CO2: 28 mEq/L (ref 19–32)
Calcium: 8.9 mg/dL (ref 8.4–10.5)
Chloride: 102 mEq/L (ref 96–112)
Creatinine, Ser: 0.92 mg/dL (ref 0.50–1.35)
GFR calc Af Amer: >90 mL/min (ref >90)
GFR calc non Af Amer: >90 mL/min (ref >90)
Glucose, Bld: 97 mg/dL (ref 70–99)
Potassium: 3.3 mEq/L — ABNORMAL LOW (ref 3.5–5.1)
Sodium: 140 mEq/L (ref 135–145)
Total Bilirubin: 0.4 mg/dL (ref 0.3–1.2)
Total Protein: 7.1 g/dL (ref 6.0–8.3)

## 2012-09-18 LAB — PSA: PSA: <0.01 ng/mL — ABNORMAL LOW (ref <=4.00)

## 2012-09-18 MED ORDER — POTASSIUM CHLORIDE ER 10 MEQ PO TBCR: 10.0000 meq | EXTENDED_RELEASE_TABLET | Freq: Every day | ORAL | Status: DC

## 2012-09-18 MED ORDER — DENOSUMAB 120 MG/1.7ML ~~LOC~~ SOLN
120.0000 mg | Freq: Once | SUBCUTANEOUS | Status: AC
  Administered 2012-09-18: 120 mg via SUBCUTANEOUS
  Filled 2012-09-18: qty 1.7

## 2012-09-18 NOTE — Progress Notes (Signed)
VSS.  Tolerated Denosumab 120 mg sub-q to lower right abd well.

## 2012-09-18 NOTE — Progress Notes (Signed)
Labs drawn today for cbc/diff,cmp,psa 

## 2012-09-25 ENCOUNTER — Encounter: Payer: Self-pay | Admitting: Adult Health

## 2012-09-25 ENCOUNTER — Ambulatory Visit (INDEPENDENT_AMBULATORY_CARE_PROVIDER_SITE_OTHER): Payer: Medicare Other | Admitting: Adult Health

## 2012-09-25 VITALS — BP 109/69 | HR 60 | Ht 69.0 in | Wt 167.0 lb

## 2012-09-25 DIAGNOSIS — E782 Mixed hyperlipidemia: Secondary | ICD-10-CM

## 2012-09-25 DIAGNOSIS — I2510 Atherosclerotic heart disease of native coronary artery without angina pectoris: Secondary | ICD-10-CM

## 2012-09-25 NOTE — Progress Notes (Deleted)
Name: [name] [name]    DOB: 10/27/1955  Age: 57 y.o.  MR#: [id number]       PCP:  [name], MD      Insurance: Payor: [employer] [place] [employer]  Plan: BLUE MEDICARE  Product Type: *No Product type*    CC:    Chief Complaint  Patient presents with  . Hypertension    VS Filed Vitals:   09/25/12 1441  BP: 109/69  Pulse: 60  Height: 5\' 9"  (1.753 m)  Weight: 167 lb (75.751 kg)    Weights Current Weight  09/25/12 167 lb (75.751 kg)  08/19/12 163 lb (73.936 kg)  07/04/12 165 lb 3.2 oz (74.934 kg)    Blood Pressure  BP Readings from Last 3 Encounters:  09/25/12 109/69  09/18/12 119/83  08/19/12 148/98     Admit date:  (Not on file) Last encounter with RMR:  Visit date not found   Allergy Zoledronic acid  Current Outpatient Prescriptions  Medication Sig Dispense Refill  . ALBUTEROL IN Inhale into the lungs as needed.        . ALPRAZolam (XANAX) 0.5 MG tablet TAKE (1) TABLET BY MOUTH FOUR TIMES DAILY PRN.  120 tablet  2  . aspirin 81 MG tablet Take 81 mg by mouth daily.      Marland Kitchen azithromycin (ZITHROMAX) 250 MG tablet As directed  6 each  0  . calcipotriene (DOVONOX) 0.005 % cream Apply topically 2 (two) times daily.        . calcipotriene-betamethasone (TACLONEX) ointment Apply 1 application topically daily.  60 g  3  . Calcium Carbonate-Vitamin D (CALTRATE 600+D PO) Take by mouth daily.        Marland Kitchen CAPEX 0.01 % SHAM Apply 1 application topically as needed.       Marland Kitchen CLOBEX SPRAY 0.05 % external spray Apply 5 sprays topically daily.       . diazepam (VALIUM) 10 MG tablet Take 10 mg by mouth as needed.       Clinical research associate Bandages & Supports (LUMBAR BACK BRACE/SUPPORT PAD) MISC 1 each by Does not apply route as directed.  1 each  1  . gabapentin (NEURONTIN) 300 MG capsule TAKE 3 CAPSULES BY MOUTH AT BEDTIME.  90 capsule  2  . leuprolide, 6 Month, (ELIGARD) 45 MG injection Inject 45 mg into the skin.      Marland Kitchen meclizine (ANTIVERT) 25 MG tablet Take 25 mg by mouth 3 (three)  times daily as needed.      . metoprolol succinate (TOPROL-XL) 25 MG 24 hr tablet TAKE (1) TABLET BY MOUTH ONCE DAILY.  30 tablet  5  . NITROSTAT 0.4 MG SL tablet PLACE 1 TAB UNDER TONGUE AS DIRECTED FOR CHEST PAIN.  25 each  4  . oxyCODONE (OXYCONTIN) 20 MG 12 hr tablet Take 1 tablet (20 mg total) by mouth every 8 (eight) hours. Take 1 TID  90 tablet  0  . oxyCODONE-acetaminophen (PERCOCET) 10-325 MG per tablet Take 1 tablet by mouth every 6 (six) hours as needed for pain.  100 tablet  0  . pantoprazole (PROTONIX) 40 MG tablet Take 40 mg by mouth daily. Taking 1 - 2 daily      . potassium chloride (K-DUR) 10 MEQ tablet Take 1 tablet (10 mEq total) by mouth daily.  30 tablet  0  . pravastatin (PRAVACHOL) 40 MG tablet TAKE 2 TABLETS BY MOUTH AT BEDTIME.  60 tablet  0   No current  facility-administered medications for this visit.    Discontinued Meds:   There are no discontinued medications.  Patient Active Problem List  Diagnosis  . GERD  . RECTAL BLEEDING  . CORONARY ATHEROSCLEROSIS NATIVE CORONARY ARTERY  . Mixed hyperlipidemia  . Prostate ca  . Bone metastases  . Prostate cancer    LABS    Component Value Date/Time   NA 140 09/18/2012 1019   NA 140 08/19/2012 1407   NA 137 07/21/2012 1122   K 3.3* 09/18/2012 1019   K 4.0 08/19/2012 1407   K 4.1 07/21/2012 1122   CL 102 09/18/2012 1019   CL 102 08/19/2012 1407   CL 100 07/21/2012 1122   CO2 28 09/18/2012 1019   CO2 24 08/19/2012 1407   CO2 29 07/21/2012 1122   GLUCOSE 97 09/18/2012 1019   GLUCOSE 94 08/19/2012 1407   GLUCOSE 95 07/21/2012 1122   BUN 8 09/18/2012 1019   BUN 7 08/19/2012 1407   BUN 12 07/21/2012 1122   CREATININE 0.92 09/18/2012 1019   CREATININE 0.92 08/19/2012 1407   CREATININE 1.18 07/21/2012 1122   CALCIUM 8.9 09/18/2012 1019   CALCIUM 8.9 08/19/2012 1407   CALCIUM 9.1 07/21/2012 1122   GFRNONAA >90 09/18/2012 1019   GFRNONAA >90 08/19/2012 1407   GFRNONAA 66* 07/21/2012 1122   GFRAA >90 09/18/2012 1019   GFRAA >90  08/19/2012 1407   GFRAA 77* 07/21/2012 1122   CMP     Component Value Date/Time   NA 140 09/18/2012 1019   K 3.3* 09/18/2012 1019   CL 102 09/18/2012 1019   CO2 28 09/18/2012 1019   GLUCOSE 97 09/18/2012 1019   BUN 8 09/18/2012 1019   CREATININE 0.92 09/18/2012 1019   CALCIUM 8.9 09/18/2012 1019   PROT 7.1 09/18/2012 1019   ALBUMIN 4.1 09/18/2012 1019   AST 18 09/18/2012 1019   ALT 10 09/18/2012 1019   ALKPHOS 51 09/18/2012 1019   BILITOT 0.4 09/18/2012 1019   GFRNONAA >90 09/18/2012 1019   GFRAA >90 09/18/2012 1019       Component Value Date/Time   WBC 6.6 09/18/2012 1019   WBC 6.8 08/19/2012 1407   WBC 7.5 06/23/2012 1055   HGB 12.5* 09/18/2012 1019   HGB 12.5* 08/19/2012 1407   HGB 12.2* 06/23/2012 1055   HCT 36.3* 09/18/2012 1019   HCT 36.4* 08/19/2012 1407   HCT 36.1* 06/23/2012 1055   MCV 89.6 09/18/2012 1019   MCV 89.7 08/19/2012 1407   MCV 89.8 06/23/2012 1055    Lipid Panel     Component Value Date/Time   CHOL 146 07/26/2011 1425   TRIG 135 07/26/2011 1425   HDL 47 07/26/2011 1425   CHOLHDL 3.1 07/26/2011 1425   VLDL 27 07/26/2011 1425   LDLCALC 72 07/26/2011 1425    ABG No results found for this basename: phart, pco2, pco2art, po2, po2art, hco3, tco2, acidbasedef, o2sat     No results found for this basename: TSH   BNP (last 3 results) No results found for this basename: PROBNP,  in the last 8760 hours Cardiac Panel (last 3 results) No results found for this basename: CKTOTAL, CKMB, TROPONINI, RELINDX,  in the last 72 hours  Iron/TIBC/Ferritin No results found for this basename: iron, tibc, ferritin     EKG Orders placed in visit on 09/25/12  . EKG 12-LEAD     Prior Assessment and Plan Problem List as of 09/25/2012     ICD-9-CM   GERD   Last  Assessment & Plan   08/25/2010 Office Visit Written 08/25/2010  2:21 PM by Jonelle Sidle, MD     Continue on proton pump inhibitor.    RECTAL BLEEDING   Last Assessment & Plan   08/25/2010 Office Visit Written 08/25/2010  2:22  PM by Jonelle Sidle, MD     Intermittent hematochezia, likely related to documented internal hemorrhoids based on colonoscopy in January. Do not plan to interrupt dual antiplatelet therapy at this time in light of recently placed drug-eluting stents. I asked him to keep a close eye on this, increase fiber in his diet, consider stool softeners if necessary. Followup CBC for his next visit. If symptoms worsen, he should let us know sooner.    CORONARY ATHEROSCLEROSIS NATIVE CORONARY ARTERY   Last Assessment & Plan   01/31/2012 Office Visit Written 01/31/2012  9:26 AM by Jonelle Sidle, MD     Continue medical therapy and observation. ECG reviewed. Followup in 6 months.    Mixed hyperlipidemia   Last Assessment & Plan   01/31/2012 Office Visit Written 01/31/2012  9:26 AM by Jonelle Sidle, MD     LDL has been at goal this year. Check FLP and LFT for next visit.    Prostate ca   Bone metastases   Prostate cancer       Imaging: No results found.

## 2012-09-25 NOTE — Patient Instructions (Addendum)
Your physician recommends that you schedule a follow-up appointment in: ONE MONTH  Your physician has requested that you have an echocardiogram. Echocardiography is a painless test that uses sound waves to create images of your heart. It provides your doctor with information about the size and shape of your heart and how well your heart's chambers and valves are working. This procedure takes approximately one hour. There are no restrictions for this procedure.

## 2012-09-25 NOTE — Progress Notes (Signed)
HPI [name] is a 57 year old patient of Dr. Simona Huh we are following for ongoing assessment and management of CAD with drug-eluting stent to the LAD in January of 2012, hypertension, with a history of mixed hyperlipidemia. The patient was last seen by Dr. Diona Browner in August of 2013 with complaints of chest discomfort. EKG was reviewed and found to be negative for ACS. The patient was not planned for any cardiac testing at that time with medical therapy and observation only. He also is followed by oncology for prostate cancer. He has recently been treated for bronchitis with a Z-Pak. He still you much better and is without complaint. He denies chest pain, dyspnea on exertion, near syncope or diaphoresis. He is medically compliant. He has been taken off of anticoagulation.  Allergies  Allergen Reactions  . Zoledronic Acid     ELEVATED BP, DIAPHORETIC & DIZZINESS    Current Outpatient Prescriptions  Medication Sig Dispense Refill  . ALBUTEROL IN Inhale into the lungs as needed.        . ALPRAZolam (XANAX) 0.5 MG tablet TAKE (1) TABLET BY MOUTH FOUR TIMES DAILY PRN.  120 tablet  2  . aspirin 81 MG tablet Take 81 mg by mouth daily.      Marland Kitchen azithromycin (ZITHROMAX) 250 MG tablet As directed  6 each  0  . calcipotriene (DOVONOX) 0.005 % cream Apply topically 2 (two) times daily.        . calcipotriene-betamethasone (TACLONEX) ointment Apply 1 application topically daily.  60 g  3  . Calcium Carbonate-Vitamin D (CALTRATE 600+D PO) Take by mouth daily.        Marland Kitchen CAPEX 0.01 % SHAM Apply 1 application topically as needed.       Marland Kitchen CLOBEX SPRAY 0.05 % external spray Apply 5 sprays topically daily.       . diazepam (VALIUM) 10 MG tablet Take 10 mg by mouth as needed.       Clinical research associate Bandages & Supports (LUMBAR BACK BRACE/SUPPORT PAD) MISC 1 each by Does not apply route as directed.  1 each  1  . gabapentin (NEURONTIN) 300 MG capsule TAKE 3 CAPSULES BY MOUTH AT BEDTIME.  90 capsule  2  . leuprolide,  6 Month, (ELIGARD) 45 MG injection Inject 45 mg into the skin.      Marland Kitchen meclizine (ANTIVERT) 25 MG tablet Take 25 mg by mouth 3 (three) times daily as needed.      . metoprolol succinate (TOPROL-XL) 25 MG 24 hr tablet TAKE (1) TABLET BY MOUTH ONCE DAILY.  30 tablet  5  . NITROSTAT 0.4 MG SL tablet PLACE 1 TAB UNDER TONGUE AS DIRECTED FOR CHEST PAIN.  25 each  4  . oxyCODONE (OXYCONTIN) 20 MG 12 hr tablet Take 1 tablet (20 mg total) by mouth every 8 (eight) hours. Take 1 TID  90 tablet  0  . oxyCODONE-acetaminophen (PERCOCET) 10-325 MG per tablet Take 1 tablet by mouth every 6 (six) hours as needed for pain.  100 tablet  0  . pantoprazole (PROTONIX) 40 MG tablet Take 40 mg by mouth daily. Taking 1 - 2 daily      . potassium chloride (K-DUR) 10 MEQ tablet Take 1 tablet (10 mEq total) by mouth daily.  30 tablet  0  . pravastatin (PRAVACHOL) 40 MG tablet TAKE 2 TABLETS BY MOUTH AT BEDTIME.  60 tablet  0   No current facility-administered medications for this visit.    Past Medical History  Diagnosis Date  . Coronary artery disease     DES to LAD 1/12  . COPD (chronic obstructive pulmonary disease)   . Sigmoid diverticulosis   . DDD (degenerative disc disease)   . Anxiety   . Depression   . Psoriasis   . Tubular adenoma     Colonoscopy 1/12  . Horseshoe kidney     Single kidney  . Prostate cancer     Metastatic, stage IV  - Dr. Mariel Sleet  . Internal hemorrhoids     Colonoscopy 1/12  . Drug reaction     Pamidronate - induced skin toxicity, Zometa - induced toxicity  . GERD (gastroesophageal reflux disease)   . Gastritis   . Prostate ca 12/29/2010  . Bone metastases 12/29/2010  . Osteoarthritis     Past Surgical History  Procedure Laterality Date  . Anterior fusion cervical spine    . Posterior fusion cervical spine    . Left foot surgery    . Prostatectomy    . Appendectomy    . Heart stents  06/2010    2    UJW:JXBJYN of systems complete and found to be negative unless listed  above  PHYSICAL EXAM BP 109/69  Pulse 60  Ht 5\' 9"  (1.753 m)  Wt 167 lb (75.751 kg)  BMI 24.65 kg/m2  General: Well developed, well nourished, in no acute distress Head: Eyes PERRLA, No xanthomas.   Normal cephalic and atramatic  Lungs: Clear bilaterally to auscultation and percussion. Heart: HRRR S1 S2, without MRG.  Pulses are 2+ & equal.            No carotid bruit. No JVD.  No abdominal bruits. No femoral bruits. Abdomen: Bowel sounds are positive, abdomen soft and non-tender without masses or                  Hernia's noted. Msk:  Back normal, normal gait. Normal strength and tone for age. Extremities: No clubbing, cyanosis or edema bruising is noted..  DP +1 Neuro: Alert and oriented X 3. Psych:  Good affect, responds appropriately  WGN:FAOZH bradycardia rate of 59 bpm.  ASSESSMENT AND PLAN

## 2012-09-25 NOTE — Addendum Note (Signed)
Addended by: Derry Lory A on: 09/25/2012 03:42 PM   Modules accepted: Orders

## 2012-09-25 NOTE — Assessment & Plan Note (Signed)
He has no cardiac complaints at this time. However I will repeat his echocardiogram after lengthy radiation and chemotherapy in the setting of prostate cancer, to evaluate LV function. We will make no changes in his medication regimen at this time. He will continue on beta blocker.

## 2012-09-25 NOTE — Assessment & Plan Note (Signed)
Ongoing risk management at this time. He will continue on pravastatin 40 mg daily labs are completed per primary care physician.

## 2012-10-01 ENCOUNTER — Other Ambulatory Visit (HOSPITAL_COMMUNITY): Payer: Medicare Other

## 2012-10-01 ENCOUNTER — Ambulatory Visit (HOSPITAL_COMMUNITY): Payer: Medicare Other | Admitting: Oncology

## 2012-10-16 ENCOUNTER — Encounter (HOSPITAL_COMMUNITY): Payer: Medicare Other | Attending: Oncology

## 2012-10-16 ENCOUNTER — Encounter (HOSPITAL_BASED_OUTPATIENT_CLINIC_OR_DEPARTMENT_OTHER): Payer: Medicare Other

## 2012-10-16 VITALS — BP 132/85 | HR 58 | Temp 97.8°F | Resp 16

## 2012-10-16 DIAGNOSIS — C801 Malignant (primary) neoplasm, unspecified: Secondary | ICD-10-CM | POA: Insufficient documentation

## 2012-10-16 DIAGNOSIS — C61 Malignant neoplasm of prostate: Secondary | ICD-10-CM | POA: Insufficient documentation

## 2012-10-16 DIAGNOSIS — C7951 Secondary malignant neoplasm of bone: Secondary | ICD-10-CM

## 2012-10-16 DIAGNOSIS — C7952 Secondary malignant neoplasm of bone marrow: Secondary | ICD-10-CM | POA: Insufficient documentation

## 2012-10-16 LAB — CBC WITH DIFFERENTIAL/PLATELET
Basophils Absolute: 0 10*3/uL (ref 0.0–0.1)
Basophils Relative: 1 % (ref 0–1)
Eosinophils Absolute: 0.4 10*3/uL (ref 0.0–0.7)
Eosinophils Relative: 6 % — ABNORMAL HIGH (ref 0–5)
HCT: 37.6 % — ABNORMAL LOW (ref 39.0–52.0)
Hemoglobin: 12.8 g/dL — ABNORMAL LOW (ref 13.0–17.0)
Lymphocytes Relative: 32 % (ref 12–46)
Lymphs Abs: 2.3 10*3/uL (ref 0.7–4.0)
MCH: 31.6 pg (ref 26.0–34.0)
MCHC: 34 g/dL (ref 30.0–36.0)
MCV: 92.8 fL (ref 78.0–100.0)
Monocytes Absolute: 0.5 10*3/uL (ref 0.1–1.0)
Monocytes Relative: 7 % (ref 3–12)
Neutro Abs: 4 10*3/uL (ref 1.7–7.7)
Neutrophils Relative %: 56 % (ref 43–77)
Platelets: 264 10*3/uL (ref 150–400)
RBC: 4.05 MIL/uL — ABNORMAL LOW (ref 4.22–5.81)
RDW: 14.3 % (ref 11.5–15.5)
WBC: 7.2 10*3/uL (ref 4.0–10.5)

## 2012-10-16 LAB — COMPREHENSIVE METABOLIC PANEL
ALT: 9 U/L (ref 0–53)
AST: 17 U/L (ref 0–37)
Albumin: 4.2 g/dL (ref 3.5–5.2)
Alkaline Phosphatase: 57 U/L (ref 39–117)
BUN: 15 mg/dL (ref 6–23)
CO2: 27 mEq/L (ref 19–32)
Calcium: 9.1 mg/dL (ref 8.4–10.5)
Chloride: 105 mEq/L (ref 96–112)
Creatinine, Ser: 1.15 mg/dL (ref 0.50–1.35)
GFR calc Af Amer: 79 mL/min — ABNORMAL LOW (ref >90)
GFR calc non Af Amer: 68 mL/min — ABNORMAL LOW (ref >90)
Glucose, Bld: 81 mg/dL (ref 70–99)
Potassium: 4.2 mEq/L (ref 3.5–5.1)
Sodium: 141 mEq/L (ref 135–145)
Total Bilirubin: 0.5 mg/dL (ref 0.3–1.2)
Total Protein: 7.2 g/dL (ref 6.0–8.3)

## 2012-10-16 MED ORDER — OXYCODONE HCL 20 MG PO TB12: 20.0000 mg | ORAL_TABLET | Freq: Three times a day (TID) | ORAL | Status: DC

## 2012-10-16 MED ORDER — OXYCODONE-ACETAMINOPHEN 10-325 MG PO TABS: 1.0000 | ORAL_TABLET | Freq: Four times a day (QID) | ORAL | Status: DC | PRN

## 2012-10-16 MED ORDER — DENOSUMAB 120 MG/1.7ML ~~LOC~~ SOLN
120.0000 mg | Freq: Once | SUBCUTANEOUS | Status: AC
  Administered 2012-10-16: 120 mg via SUBCUTANEOUS
  Filled 2012-10-16: qty 1.7

## 2012-10-16 NOTE — Progress Notes (Signed)
Labs drawn today for cbc/diff,cmp,psa 

## 2012-10-16 NOTE — Progress Notes (Signed)
[name] [name] presents today for injection per MD orders. Xgeva 120 mg administered SQ in left Abdomen. Administration without incident. Patient tolerated well.

## 2012-10-17 ENCOUNTER — Other Ambulatory Visit (HOSPITAL_COMMUNITY): Payer: Self-pay | Admitting: Oncology

## 2012-10-17 LAB — PSA: PSA: <0.01 ng/mL — ABNORMAL LOW (ref <=4.00)

## 2012-10-18 ENCOUNTER — Ambulatory Visit (HOSPITAL_COMMUNITY): Payer: Medicare Other

## 2012-10-20 ENCOUNTER — Other Ambulatory Visit: Payer: Self-pay | Admitting: Family Medicine

## 2012-10-23 ENCOUNTER — Ambulatory Visit (HOSPITAL_COMMUNITY)
Admission: RE | Admit: 2012-10-23 | Discharge: 2012-10-23 | Disposition: A | Discharge status: Home / Self Care | Payer: Medicare Other | Source: Ambulatory Visit | Attending: Adult Health | Admitting: Adult Health

## 2012-10-23 DIAGNOSIS — E785 Hyperlipidemia, unspecified: Secondary | ICD-10-CM | POA: Insufficient documentation

## 2012-10-23 DIAGNOSIS — I252 Old myocardial infarction: Secondary | ICD-10-CM | POA: Insufficient documentation

## 2012-10-23 DIAGNOSIS — Z8546 Personal history of malignant neoplasm of prostate: Secondary | ICD-10-CM | POA: Insufficient documentation

## 2012-10-23 DIAGNOSIS — I2510 Atherosclerotic heart disease of native coronary artery without angina pectoris: Secondary | ICD-10-CM | POA: Insufficient documentation

## 2012-10-23 DIAGNOSIS — I519 Heart disease, unspecified: Secondary | ICD-10-CM

## 2012-10-23 NOTE — Progress Notes (Signed)
*  PRELIMINARY RESULTS* Echocardiogram 2D Echocardiogram has been performed.  Conrad Haubstadt 10/23/2012, 3:15 PM

## 2012-10-24 ENCOUNTER — Encounter: Payer: Medicare Other | Admitting: Adult Health

## 2012-10-24 ENCOUNTER — Telehealth: Payer: Self-pay | Admitting: *Deleted

## 2012-10-24 NOTE — Progress Notes (Signed)
Cancelled.  

## 2012-10-24 NOTE — Telephone Encounter (Signed)
Called pt to advise he can still come for apt today, per called to question with normal results, KL advised she would be happy to see him today, however she notes he had recent concerns with bronchitis and that she is happy to see him however per pt question today about not coming in over discussion over echo results pt must be concerned with not coming in, pt advised he does feel somewhat better and will call office if any further concerns come up, pt understood we will have him come in for a 6 month follow up and a letter will be mailed concerning this, KL made aware, recall placed in chart, apt  Cancelled

## 2012-11-18 ENCOUNTER — Encounter (HOSPITAL_BASED_OUTPATIENT_CLINIC_OR_DEPARTMENT_OTHER): Payer: Medicare Other

## 2012-11-18 ENCOUNTER — Encounter (HOSPITAL_COMMUNITY): Payer: Medicare Other | Attending: Oncology

## 2012-11-18 DIAGNOSIS — C61 Malignant neoplasm of prostate: Secondary | ICD-10-CM

## 2012-11-18 DIAGNOSIS — C7951 Secondary malignant neoplasm of bone: Secondary | ICD-10-CM

## 2012-11-18 DIAGNOSIS — C801 Malignant (primary) neoplasm, unspecified: Secondary | ICD-10-CM | POA: Insufficient documentation

## 2012-11-18 DIAGNOSIS — C7952 Secondary malignant neoplasm of bone marrow: Secondary | ICD-10-CM

## 2012-11-18 LAB — COMPREHENSIVE METABOLIC PANEL
ALT: 9 U/L (ref 0–53)
AST: 18 U/L (ref 0–37)
Albumin: 4.2 g/dL (ref 3.5–5.2)
Alkaline Phosphatase: 57 U/L (ref 39–117)
BUN: 11 mg/dL (ref 6–23)
CO2: 26 mEq/L (ref 19–32)
Calcium: 8.8 mg/dL (ref 8.4–10.5)
Chloride: 101 mEq/L (ref 96–112)
Creatinine, Ser: 1.11 mg/dL (ref 0.50–1.35)
GFR calc Af Amer: 83 mL/min — ABNORMAL LOW (ref >90)
GFR calc non Af Amer: 71 mL/min — ABNORMAL LOW (ref >90)
Glucose, Bld: 92 mg/dL (ref 70–99)
Potassium: 3.8 mEq/L (ref 3.5–5.1)
Sodium: 137 mEq/L (ref 135–145)
Total Bilirubin: 0.5 mg/dL (ref 0.3–1.2)
Total Protein: 7.2 g/dL (ref 6.0–8.3)

## 2012-11-18 MED ORDER — DENOSUMAB 120 MG/1.7ML ~~LOC~~ SOLN
120.0000 mg | Freq: Once | SUBCUTANEOUS | Status: AC
  Administered 2012-11-18: 120 mg via SUBCUTANEOUS
  Filled 2012-11-18: qty 1.7

## 2012-11-18 MED ORDER — OXYCODONE HCL 20 MG PO TB12: 20.0000 mg | ORAL_TABLET | Freq: Three times a day (TID) | ORAL | Status: DC

## 2012-11-18 MED ORDER — OXYCODONE-ACETAMINOPHEN 10-325 MG PO TABS: 1.0000 | ORAL_TABLET | Freq: Four times a day (QID) | ORAL | Status: DC | PRN

## 2012-11-18 NOTE — Progress Notes (Signed)
Labs drawn today for cmp 

## 2012-11-18 NOTE — Progress Notes (Signed)
[name] [name] presents today for injection per MD orders. Xgeva 120mg  administered SQ in right Abdomen. Administration without incident. Patient tolerated well.

## 2012-12-02 ENCOUNTER — Ambulatory Visit (HOSPITAL_COMMUNITY): Payer: Medicare Other | Admitting: Oncology

## 2012-12-09 ENCOUNTER — Encounter (HOSPITAL_COMMUNITY): Payer: Medicare Other | Attending: Oncology

## 2012-12-09 VITALS — BP 134/89 | HR 54 | Temp 97.9°F | Resp 16

## 2012-12-09 DIAGNOSIS — C7952 Secondary malignant neoplasm of bone marrow: Secondary | ICD-10-CM

## 2012-12-09 DIAGNOSIS — C61 Malignant neoplasm of prostate: Secondary | ICD-10-CM

## 2012-12-09 DIAGNOSIS — Z5111 Encounter for antineoplastic chemotherapy: Secondary | ICD-10-CM

## 2012-12-09 DIAGNOSIS — C7951 Secondary malignant neoplasm of bone: Secondary | ICD-10-CM

## 2012-12-09 DIAGNOSIS — C801 Malignant (primary) neoplasm, unspecified: Secondary | ICD-10-CM | POA: Insufficient documentation

## 2012-12-09 MED ORDER — LEUPROLIDE ACETATE (6 MONTH) 45 MG ~~LOC~~ KIT
45.0000 mg | PACK | Freq: Once | SUBCUTANEOUS | Status: AC
  Administered 2012-12-09: 45 mg via SUBCUTANEOUS
  Filled 2012-12-09: qty 45

## 2012-12-09 MED ORDER — LEUPROLIDE ACETATE (4 MONTH) 30 MG IM KIT: 45.0000 mg | PACK | Freq: Once | INTRAMUSCULAR | Status: DC

## 2012-12-09 NOTE — Progress Notes (Signed)
Eligard 45 mg given sub cut-q to lower right abd.  Tolerated well.

## 2012-12-16 ENCOUNTER — Encounter (HOSPITAL_BASED_OUTPATIENT_CLINIC_OR_DEPARTMENT_OTHER): Payer: Medicare Other

## 2012-12-16 ENCOUNTER — Encounter (HOSPITAL_COMMUNITY): Payer: Medicare Other

## 2012-12-16 VITALS — BP 121/79 | HR 57 | Temp 97.3°F | Resp 16 | Wt 157.9 lb

## 2012-12-16 DIAGNOSIS — C61 Malignant neoplasm of prostate: Secondary | ICD-10-CM

## 2012-12-16 DIAGNOSIS — R6884 Jaw pain: Secondary | ICD-10-CM

## 2012-12-16 DIAGNOSIS — M8580 Other specified disorders of bone density and structure, unspecified site: Secondary | ICD-10-CM

## 2012-12-16 DIAGNOSIS — M899 Disorder of bone, unspecified: Secondary | ICD-10-CM

## 2012-12-16 LAB — COMPREHENSIVE METABOLIC PANEL
ALT: 9 U/L (ref 0–53)
AST: 22 U/L (ref 0–37)
Albumin: 4.2 g/dL (ref 3.5–5.2)
Alkaline Phosphatase: 54 U/L (ref 39–117)
BUN: 13 mg/dL (ref 6–23)
CO2: 27 mEq/L (ref 19–32)
Calcium: 9.2 mg/dL (ref 8.4–10.5)
Chloride: 105 mEq/L (ref 96–112)
Creatinine, Ser: 0.99 mg/dL (ref 0.50–1.35)
GFR calc Af Amer: >90 mL/min (ref >90)
GFR calc non Af Amer: 88 mL/min — ABNORMAL LOW (ref >90)
Glucose, Bld: 91 mg/dL (ref 70–99)
Potassium: 4.3 mEq/L (ref 3.5–5.1)
Sodium: 140 mEq/L (ref 135–145)
Total Bilirubin: 0.4 mg/dL (ref 0.3–1.2)
Total Protein: 7.2 g/dL (ref 6.0–8.3)

## 2012-12-16 MED ORDER — DENOSUMAB 120 MG/1.7ML ~~LOC~~ SOLN
120.0000 mg | Freq: Once | SUBCUTANEOUS | Status: AC
  Administered 2012-12-16: 120 mg via SUBCUTANEOUS
  Filled 2012-12-16: qty 1.7

## 2012-12-16 MED ORDER — OXYCODONE HCL 20 MG PO TB12: 20.0000 mg | ORAL_TABLET | Freq: Three times a day (TID) | ORAL | Status: DC

## 2012-12-16 MED ORDER — OXYCODONE-ACETAMINOPHEN 10-325 MG PO TABS: 1.0000 | ORAL_TABLET | Freq: Four times a day (QID) | ORAL | Status: DC | PRN

## 2012-12-16 NOTE — Progress Notes (Signed)
[name] [name] presented for labwork. Labs per MD order drawn via Peripheral Line 23 gauge needle inserted in left antecubital.  Good blood return present. Procedure without incident.  Needle removed intact. Patient tolerated procedure well. [name] [name] presents today for injection per MD orders. Xgeva 120 mg administered SQ in left Abdomen. Administration without incident. Patient tolerated well.

## 2012-12-16 NOTE — Patient Instructions (Addendum)
[facility] Discharge Instructions  RECOMMENDATIONS MADE BY THE CONSULTANT AND ANY TEST RESULTS WILL BE SENT TO YOUR REFERRING PHYSICIAN.  EXAM FINDINGS BY THE PHYSICIAN TODAY AND SIGNS OR SYMPTOMS TO REPORT TO CLINIC OR PRIMARY PHYSICIAN: Exam and discussion by Dr. Sharia Reeve.  We will stop the [facility] for now until you can be evaluated by a dental surgeon.  MEDICATIONS PRESCRIBED:  Refills for Oxycontin and Percocet.  INSTRUCTIONS GIVEN AND DISCUSSED: Report uncontrolled pain or other problems.  We will get a referral with a dental surgeon to evaluate your jaw pain.  SPECIAL INSTRUCTIONS/FOLLOW-UP: Follow-up in 1 month.  Thank you for choosing [facility] to provide your oncology and hematology care.  To afford each patient quality time with our providers, please arrive at least 15 minutes before your scheduled appointment time.  With your help, our goal is to use those 15 minutes to complete the necessary work-up to ensure our physicians have the information they need to help with your evaluation and healthcare recommendations.    Effective January 1st, 2014, we ask that you re-schedule your appointment with our physicians should you arrive 10 or more minutes late for your appointment.  We strive to give you quality time with our providers, and arriving late affects you and other patients whose appointments are after yours.    Again, thank you for choosing [facility].  Our hope is that these requests will decrease the amount of time that you wait before being seen by our physicians.       _____________________________________________________________  Should you have questions after your visit to [facility], please contact our office at [phone] between the hours of 8:30 a.m. and 5:00 p.m.  Voicemails left after 4:30 p.m. will not be returned until the following business day.  For prescription refill requests, have your pharmacy  contact our office with your prescription refill request.

## 2012-12-16 NOTE — Progress Notes (Signed)
[facility] [phone]   [phone]  OFFICE PROGRESS NOTE  [name] Asa, MD [address] [place] [address]  DIAGNOSIS: Prostate cancer.   ONCOLOGIC HISTORY:Stage IV (T1 C., N1) poorly differentiated adenocarcinoma prostate status post radical prostatectomy on 02/28/2006 by Dr. Margo Aye in [facility] He was then of lymph node involvement but bone scan was negative.   INTERVAL HISTORY:   [name] [name] 57 y.o. male returns to the clinic today for scheduled visit.  She is on 6 monthly Lupron and monthly Xgeva.at least since 06/2009 from his records in the  EHR. He tells me that in the past 2 months is being having left upper jaw pain.  He has not seen a dentist.  He reports having previously  tooth root stuck in the jaw in the past. He states that he has to date the opiates to be able to eat  due to the pain. He reports colleague or joint pain and possible arthritis. He denies any change in shortness of breath.  He has mild low back pain which is essentially unchanged. He has had about 10 pounds weight loss since April 2014. History of hot flashes noted.  MEDICAL HISTORY: Past Medical History  Diagnosis Date  . Coronary artery disease     DES to LAD 1/12  . COPD (chronic obstructive pulmonary disease)   . Sigmoid diverticulosis   . DDD (degenerative disc disease)   . Anxiety   . Depression   . Psoriasis   . Tubular adenoma     Colonoscopy 1/12  . Horseshoe kidney     Single kidney  . Prostate cancer     Metastatic, stage IV  - Dr. Mariel Sleet  . Internal hemorrhoids     Colonoscopy 1/12  . Drug reaction     Pamidronate - induced skin toxicity, Zometa - induced toxicity  . GERD (gastroesophageal reflux disease)   . Gastritis   . Prostate ca 12/29/2010  . Bone metastases 12/29/2010  . Osteoarthritis     ALLERGIES:  is allergic to zoledronic acid.  MEDICATIONS:  Current Outpatient Prescriptions  Medication Sig Dispense Refill   . ALBUTEROL IN Inhale into the lungs as needed.        . ALPRAZolam (XANAX) 0.5 MG tablet TAKE (1) TABLET BY MOUTH FOUR TIMES DAILY PRN.  120 tablet  2  . aspirin 81 MG tablet Take 81 mg by mouth daily.      . calcipotriene (DOVONOX) 0.005 % cream Apply topically 2 (two) times daily.        . calcipotriene-betamethasone (TACLONEX) ointment Apply 1 application topically daily.  60 g  3  . Calcium Carbonate-Vitamin D (CALTRATE 600+D PO) Take by mouth daily.        Marland Kitchen CAPEX 0.01 % SHAM Apply 1 application topically as needed.       Marland Kitchen CLOBEX SPRAY 0.05 % external spray Apply 5 sprays topically daily.       . diazepam (VALIUM) 10 MG tablet Take 10 mg by mouth as needed.       Clinical research associate Bandages & Supports (LUMBAR BACK BRACE/SUPPORT PAD) MISC 1 each by Does not apply route as directed.  1 each  1  . gabapentin (NEURONTIN) 300 MG capsule TAKE 3 CAPSULES BY MOUTH AT BEDTIME.  90 capsule  2  . leuprolide, 6 Month, (ELIGARD) 45 MG injection Inject 45 mg into the skin.      Marland Kitchen meclizine (ANTIVERT)  25 MG tablet Take 25 mg by mouth 3 (three) times daily as needed.      . metoprolol succinate (TOPROL-XL) 25 MG 24 hr tablet TAKE (1) TABLET BY MOUTH ONCE DAILY.  30 tablet  5  . NITROSTAT 0.4 MG SL tablet PLACE 1 TAB UNDER TONGUE AS DIRECTED FOR CHEST PAIN.  25 each  4  . oxyCODONE (OXYCONTIN) 20 MG 12 hr tablet Take 1 tablet (20 mg total) by mouth every 8 (eight) hours. Take 1 TID  90 tablet  0  . oxyCODONE-acetaminophen (PERCOCET) 10-325 MG per tablet Take 1 tablet by mouth every 6 (six) hours as needed for pain.  100 tablet  0  . pantoprazole (PROTONIX) 40 MG tablet Take 40 mg by mouth daily. Taking 1 - 2 daily      . pravastatin (PRAVACHOL) 40 MG tablet TAKE 2 TABLETS BY MOUTH AT BEDTIME.  60 tablet  3   No current facility-administered medications for this visit.    SURGICAL HISTORY:  Past Surgical History  Procedure Laterality Date  . Anterior fusion cervical spine    . Posterior fusion cervical  spine    . Left foot surgery    . Prostatectomy    . Appendectomy    . Heart stents  06/2010    2     REVIEW OF SYSTEMS: multiple ecchymosis in the upper extremity.14 point review of system is as in the history above otherwise negative.   PHYSICAL EXAMINATION:  Blood pressure 121/79, pulse 57, temperature 97.3 F (36.3 C), temperature source Oral, resp. rate 16, weight 157 lb 14.4 oz (71.623 kg). GENERAL: No distress.  SKIN:  No rashes or significant lesions .  Ecchymosis in the upper extremity , mostly in the forearm. HEAD: Normocephalic, No masses, lesions, tenderness or abnormalities  EYES: Conjunctiva are pink and non-injected  ENT: there is a dental root and the left aspect of the upper outer about midway from the posterior part.  Otherwise patient is mostly edentulous.The left upper jaw gum looks inflamed LYMPH: No palpable lymphadenopathy, in the neck or axilla. LUNGS: clear to auscultation , no crackles or wheezes. HEART: regular rate & rhythm, no murmurs, no gallops, S1 normal and S2 normal  ABDOMEN: Abdomen soft, non-tender, normal bowel sounds, no masses or organomegaly and no hepatosplenomegaly palpable MSK: No CVA tenderness and no tenderness on percussion of the back or rib cage. EXTREMITIES: No edema, no skin discoloration or tenderness NEURO: alert & oriented , no focal motor/sensory deficits.     LABORATORY DATA: Lab Results  Component Value Date   WBC 7.2 10/16/2012   HGB 12.8* 10/16/2012   HCT 37.6* 10/16/2012   MCV 92.8 10/16/2012   PLT 264 10/16/2012      Chemistry      Component Value Date/Time   NA 140 12/16/2012 1136   K 4.3 12/16/2012 1136   CL 105 12/16/2012 1136   CO2 27 12/16/2012 1136   BUN 13 12/16/2012 1136   CREATININE 0.99 12/16/2012 1136      Component Value Date/Time   CALCIUM 9.2 12/16/2012 1136   ALKPHOS 54 12/16/2012 1136   AST 22 12/16/2012 1136   ALT 9 12/16/2012 1136   BILITOT 0.4 12/16/2012 1136       RADIOGRAPHIC STUDIES: No  results found.   ASSESSMENT:  Mr. Joyce has stage IV prostate cancer which was treated as above.It appears that and has continued androgen deprivation since then. Review of his paper chart revealed that patient at some  point was pamidronate than later Zometa I suspect for treatment of osteopenia and had  complication to both prompting change to [facility] in Jan of 2011 and has continued this till then.  Patient for review of his records are currently has no obvious bone metastasis or history of such. I think that his osteopenia can be treated with Calcium and vitamin D.  I also feel that androgen deprivation can be stopped at this time since it appears the patient has been on this dating back to 2007.  This exceeds NCCN Recommendation of 2-3 years of ADT adjuvant therapy. PSA has been undetectable since 2008 at least. He is troubled less concerning for osteonecrosis of the jaw, in this regard I have advised that Rivka Barbara be definitely stopped until this is further evaluated.Besides patient has no history of bone metastasis.  PLAN:  1.I discontinued Xgeva indefinitely. 2. I referred patient or Oral surgeon in [place] to evaluate his left upper jaw pain complaint.  Concern for ONJ is high given history of Xgeva. 3. He will continue to use his opiates for pain control as previously. 4. You'll return to clinic in 4 weeks to see how he is doing, then I would recommend discussing discontinuation of ADT and possibly observing him.    All questions were satisfactorily answered. Patient knows to call if  any concern arises.  I spent more than 50 % counseling the patient face to face. The total time spent in the appointment was 30 minutes.   Sherral Hammers, MD FACP. Hematology/Oncology.

## 2012-12-17 ENCOUNTER — Other Ambulatory Visit (HOSPITAL_COMMUNITY): Payer: Self-pay | Admitting: Oncology

## 2012-12-17 ENCOUNTER — Other Ambulatory Visit: Payer: Self-pay | Admitting: Family Medicine

## 2012-12-17 ENCOUNTER — Encounter (HOSPITAL_COMMUNITY): Payer: Self-pay | Admitting: Oncology

## 2012-12-19 ENCOUNTER — Telehealth (HOSPITAL_COMMUNITY): Payer: Self-pay

## 2012-12-19 NOTE — Telephone Encounter (Signed)
December 19, 2012 Called patient on 12/17/2012 and 12/19/2012 leaving message for patient to call office to schedule appointment in office. LI

## 2012-12-24 ENCOUNTER — Ambulatory Visit (HOSPITAL_COMMUNITY): Payer: Medicare Other | Admitting: Dentistry

## 2012-12-24 ENCOUNTER — Encounter (HOSPITAL_COMMUNITY): Payer: Self-pay | Admitting: Dentistry

## 2012-12-24 VITALS — BP 128/78 | HR 54 | Temp 98.0°F

## 2012-12-24 DIAGNOSIS — K0889 Other specified disorders of teeth and supporting structures: Secondary | ICD-10-CM

## 2012-12-24 DIAGNOSIS — K036 Deposits [accretions] on teeth: Secondary | ICD-10-CM

## 2012-12-24 DIAGNOSIS — M278 Other specified diseases of jaws: Secondary | ICD-10-CM

## 2012-12-24 DIAGNOSIS — C61 Malignant neoplasm of prostate: Secondary | ICD-10-CM

## 2012-12-24 DIAGNOSIS — K029 Dental caries, unspecified: Secondary | ICD-10-CM

## 2012-12-24 DIAGNOSIS — K08409 Partial loss of teeth, unspecified cause, unspecified class: Secondary | ICD-10-CM

## 2012-12-24 DIAGNOSIS — K0530 Chronic periodontitis, unspecified: Secondary | ICD-10-CM

## 2012-12-24 DIAGNOSIS — M264 Malocclusion, unspecified: Secondary | ICD-10-CM

## 2012-12-24 DIAGNOSIS — Z0189 Encounter for other specified special examinations: Secondary | ICD-10-CM

## 2012-12-24 DIAGNOSIS — C801 Malignant (primary) neoplasm, unspecified: Secondary | ICD-10-CM

## 2012-12-24 DIAGNOSIS — M8708 Idiopathic aseptic necrosis of bone, other site: Secondary | ICD-10-CM

## 2012-12-24 MED ORDER — CHLORHEXIDINE GLUCONATE 0.12 % MT SOLN: OROMUCOSAL | Status: AC

## 2012-12-24 NOTE — Patient Instructions (Signed)
The patient is to use chlorhexidine rinses 3 times daily as instructed. Patient is to rinse for 30 seconds and spit out excess. Patient should not rinse with water afterwards. Patient agrees to be referred to the school of dentistry in [place], [place]. Patient is to contact dental medicine if acute problems arise in the area of the exposed bone involving the upper left maxilla.  Charlynne Pander, DD

## 2012-12-24 NOTE — Progress Notes (Addendum)
DENTAL CONSULTATION  Date of Consultation:  12/24/2012 Patient Name:   [name] [name] Date of Birth:   [birth year] Medical Record Number: [id number]  VITALS: BP 128/78  Pulse 54  Temp(Src) 98 F (36.7 C) (Oral)   CHIEF COMPLAINT: The patient was referred for evaluation of upper left jaw pain and to rule out osteonecrosis of the jaw related to previous antiresorptive drug therapy.  HPI: [name] [name] is a 57 year-old male referred by Dr.Osuorji for a dental consultation.  The patient has a history of prostate cancer and is status post prostatectomy 02/28/2006. The patient was previously followed by Dr. Mariel Sleet.  The patient apparently had bony metastases noted in July 2012.  The patient has had multiple doses of antiresorptive drug therapy. This includes pamidronate, Zometa, and Xgeva.  The patient had adverse drug reactions with the pamidronate and Zometa. The patient has been taking Xgeva since January 2011, but Rivka Barbara was discontinued as of 12/16/2012 by Dr. Sharia Reeve.  Patient was referred to dental medicine for evaluation of upper left quadrant pain and exposed bone / osteonecrosis of the jaw related to the antiresorptive drug therapy.  Patient currently denies acute toothache pain. Patient does complain of upper left quadrant exposed bone that has been present over the past 3 months. Patient points to the area of tooth numbers 14 and 15.  Patient indicates that the pain is intermittent in nature. He describes an intensity of 7-10 out of 10 and it is relieved with his pain medication.  Patient has not seen a dentist for "a long time".  The patient previously had an upper complete denture and lower cast partial denture. Patient stopped wearing the upper complete denture in January of 2014. Patient has never really been able to wear the lower partial denture. These were fabricated in the 1990's by a dentist in [place], [place]. The patient did not bring the dentures with him today.  Patient denies having any regular primary dentist. Patient has previously seen Dr. Debby Bud ([place])in the past.   Patient Active Problem List   Diagnosis Date Noted  . Prostate cancer 05/30/2011  . Prostate ca 12/29/2010  . Mixed hyperlipidemia 08/25/2010  . CORONARY ATHEROSCLEROSIS NATIVE CORONARY ARTERY 07/13/2010  . GERD 04/11/2010  . RECTAL BLEEDING 04/11/2010   PMH: Past Medical History  Diagnosis Date  . Coronary artery disease     DES to LAD 1/12  . COPD (chronic obstructive pulmonary disease)   . Sigmoid diverticulosis   . DDD (degenerative disc disease)   . Anxiety   . Depression   . Psoriasis   . Tubular adenoma     Colonoscopy 1/12  . Horseshoe kidney     Single kidney  . Prostate cancer     Metastatic, stage IV  - Dr. Mariel Sleet  . Internal hemorrhoids     Colonoscopy 1/12  . Drug reaction     Pamidronate - induced skin toxicity, Zometa - induced toxicity  . GERD (gastroesophageal reflux disease)   . Gastritis   . Prostate ca 12/29/2010  . Osteoarthritis     PSH: Past Surgical History  Procedure Laterality Date  . Anterior fusion cervical spine    . Posterior fusion cervical spine    . Left foot surgery    . Prostatectomy  02/28/2006    Dr. Margo Aye in [place], [place].  Marland Kitchen Appendectomy    . Heart stents  06/2010    2    ALLERGIES: Allergies  Allergen Reactions  . Pamidronate  Other (See Comments)    Skin toxicity  . Zoledronic Acid     ELEVATED BP, DIAPHORETIC & DIZZINESS    MEDICATIONS: Current Outpatient Prescriptions  Medication Sig Dispense Refill  . ALBUTEROL IN Inhale into the lungs as needed.        . ALPRAZolam (XANAX) 0.5 MG tablet TAKE (1) TABLET BY MOUTH FOUR TIMES DAILY PRN.  120 tablet  2  . aspirin 81 MG tablet Take 81 mg by mouth daily.      . calcipotriene (DOVONOX) 0.005 % cream Apply topically 2 (two) times daily.        . calcipotriene-betamethasone (TACLONEX) ointment Apply 1 application topically daily.  60 g  3  .  Calcium Carbonate-Vitamin D (CALTRATE 600+D PO) Take by mouth daily.        Marland Kitchen CAPEX 0.01 % SHAM Apply 1 application topically as needed.       Marland Kitchen CLOBEX SPRAY 0.05 % external spray Apply 5 sprays topically daily.       . diazepam (VALIUM) 10 MG tablet Take 10 mg by mouth as needed.       Clinical research associate Bandages & Supports (LUMBAR BACK BRACE/SUPPORT PAD) MISC 1 each by Does not apply route as directed.  1 each  1  . gabapentin (NEURONTIN) 300 MG capsule TAKE 3 CAPSULES BY MOUTH AT BEDTIME.  90 capsule  2  . leuprolide, 6 Month, (ELIGARD) 45 MG injection Inject 45 mg into the skin.      Marland Kitchen meclizine (ANTIVERT) 25 MG tablet Take 25 mg by mouth 3 (three) times daily as needed.      . metoprolol succinate (TOPROL-XL) 25 MG 24 hr tablet TAKE (1) TABLET BY MOUTH ONCE DAILY.  30 tablet  5  . NITROSTAT 0.4 MG SL tablet PLACE 1 TAB UNDER TONGUE AS DIRECTED FOR CHEST PAIN.  25 each  4  . oxyCODONE (OXYCONTIN) 20 MG 12 hr tablet Take 1 tablet (20 mg total) by mouth every 8 (eight) hours. Take 1 TID  90 tablet  0  . oxyCODONE-acetaminophen (PERCOCET) 10-325 MG per tablet Take 1 tablet by mouth every 6 (six) hours as needed for pain.  100 tablet  0  . pantoprazole (PROTONIX) 40 MG tablet TAKE (1) TABLET TWICE DAILY.  60 tablet  1  . pravastatin (PRAVACHOL) 40 MG tablet TAKE 2 TABLETS BY MOUTH AT BEDTIME.  60 tablet  3  . VENTOLIN HFA 108 (90 BASE) MCG/ACT inhaler USE ONE PUFF EVERY 4 HOURS WITH AEROCHAMBER. PATIENT NEEDS TO BE SEEN.  18 g  2   No current facility-administered medications for this visit.    LABS: Lab Results  Component Value Date   WBC 7.2 10/16/2012   HGB 12.8* 10/16/2012   HCT 37.6* 10/16/2012   MCV 92.8 10/16/2012   PLT 264 10/16/2012      Component Value Date/Time   NA 140 12/16/2012 1136   K 4.3 12/16/2012 1136   CL 105 12/16/2012 1136   CO2 27 12/16/2012 1136   GLUCOSE 91 12/16/2012 1136   BUN 13 12/16/2012 1136   CREATININE 0.99 12/16/2012 1136   CALCIUM 9.2 12/16/2012 1136   GFRNONAA 88*  12/16/2012 1136   GFRAA >90 12/16/2012 1136   Lab Results  Component Value Date   INR 0.97 06/28/2010   No results found for this basename: PTT    SOCIAL HISTORY: History   Social History  . Marital Status: Married    Spouse Name: N/A    Number  of Children: N/A  . Years of Education: N/A   Occupational History  . Part-time, small jobs    Social History Main Topics  . Smoking status: Former Smoker -- 1.00 packs/day for 15 years    Types: Cigarettes    Quit date: 08/25/2007  . Smokeless tobacco: Never Used  . Alcohol Use: No  . Drug Use: No  . Sexually Active: Not on file   Other Topics Concern  . Not on file   Social History Narrative  . No narrative on file    FAMILY HISTORY: Family History  Problem Relation Age of Onset  . Alzheimer's disease Mother   . Lung cancer Father   . Heart attack Brother      REVIEW OF SYSTEMS: Reviewed with the patient and included in the dental record.   DENTAL HISTORY: CHIEF COMPLAINT: The patient was referred for evaluation of upper left jaw pain and to rule out osteonecrosis of the jaw related to previous antiresorptive drug therapy. HPI: AMIL BOUWMAN is a 57 year-old male referred by Dr.Osuorji for a dental consultation.  The patient has a history of prostate cancer and is status post prostatectomy 02/28/2006. The patient was previously followed by Dr. Mariel Sleet.  The patient apparently had bony metastases noted in July 2012.  The patient has had multiple doses of antiresorptive drug therapy. This includes pamidronate, Zometa, and Xgeva.  The patient had adverse drug reactions with the pamidronate and Zometa. The patient has been taking Xgeva since January 2011, but Rivka Barbara was discontinued as of 12/16/2012 by Dr. Sharia Reeve.  Patient was referred to dental medicine for evaluation of upper left quadrant pain and exposed bone / osteonecrosis of the jaw related to the antiresorptive drug therapy.  Patient currently denies acute  toothache pain. Patient does complain of upper left quadrant exposed bone that has been present over the past 3 months. Patient points to the area of tooth numbers 14 and 15.  Patient indicates that the pain is intermittent in nature. He describes an intensity of 7-10 out of 10 and it is relieved with his pain medication.  Patient has not seen a dentist for "a long time".  The patient previously had an upper complete denture and lower cast partial denture. Patient stopped wearing the upper complete denture in January of 2014. Patient has never really been able to wear the lower partial denture. These were fabricated in the 1990's by a dentist in [place], [place]. The patient did not bring the dentures with him today. Patient denies having any regular primary dentist. Patient has previously seen Dr. Debby Bud ([place])in the past.  DENTAL EXAMINATION:  GENERAL: The patient is a well-developed, well-nourished male in no acute distress. HEAD AND NECK: Patient has no obvious submandibular lymphadenopathy. The patient denies acute TMJ symptoms. INTRAORAL EXAM: Patient has xerostomia. The patient does not have any abscess formation. The patient has an 8 x 8 mm round area of exposed bone involving the alveolar ridge in the area of tooth numbers 14 and 15. The patient indicates that this has been present over the past 3-4 months.There is no evidence of purulent exudate associated with the exposed bone. The tissues around the exposed bone are NOT erythematous.   DENTITION: Patient is missing tooth numbers 1 through 16, as well as tooth numbers 18, 19, 20,21,22, 24, 28, 29, 30, 31, and 32. PERIODONTAL: The patient has chronic periodontitis with plaque and calculus accumulations, generalized gingival recession and generalized tooth mobility as per dental charting  form. There is moderate to severe bone loss. DENTAL CARIES/SUBOPTIMAL RESTORATIONS: Multiple dental caries and suboptimal dental  restorations are noted. ENDODONTIC:  The patient currently denies acute toothache. Patient does have pain coming from the exposed bone in the area of numbers 14/15. CROWN AND BRIDGE: None. PROSTHODONTIC: The patient has a history of an upper maxillary complete denture that the patient has not worn since January 2014. The upper complete and lower cast partial dentures were fabricated in the 1990s in [place], [place]. Patient cannot remember the name of the dentist at that time. Patient indicates that he was never really able to wear the lower partial denture. OCCLUSION: The patient has a poor occlusal scheme secondary to multiple missing teeth, supra-eruption and drifting of the unopposed teeth into the edentulous areas, and lack of replacement of all missing teeth with clinically acceptable dental prostheses.  RADIOGRAPHIC INTERPRETATION: An orthopantogram was taken and supplemented with periapical radiographs. There are  multiple missing teeth. There is supra-eruption and drifting of the unopposed teeth into the edentulous areas. There is pneumatization of the bilateral maxillary sinuses. Dental caries are noted. No obvious periapical radiolucency is noted.   ASSESSMENTS: 1. 8 x 8 mm exposed bone involving the alveolar ridge of the maxilla and the area of tooth numbers 14/15. 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 2. Chronic periodontitis with bone loss 3. Plaque and calculus accumulations 4. Gingival recession 5. Tooth mobility as per dental charting form 6. Multiple dental caries as per dental charting form 7. Multiple missing teeth 8. History of ill fitting maxillary complete denture and lower partial denture 9. Malocclusion 10. History of Xgeva therapy with risk for osteonecrosis of the jaw with future dental extractions and invasive  dental procedures   PLAN/RECOMMENDATIONS: 1. I discussed the risks, benefits, and complications of various treatment options with the patient in relationship to his medical and dental conditions previous antiresorptive drug therapy, and current osteonecrosis of the jaw and potential for future osteonecrosis of the jaw with invasive dental procedures.  We discussed various treatment options to include no treatment, multiple extractions with alveoloplasty, pre-prosthetic surgery as indicated, periodontal therapy, dental restorations, root canal therapy, crown and bridge therapy, implant therapy, and replacement of missing teeth as indicated. We also discussed referral to the [facility] in [place] to discuss possible treatment interventions for the current osteonecrosis of the jaw and to address possible dental treatment options related to existing dentition. The patient currently wishes to proceed with referral to [facility]. Release of information was obtained today.  In the meantime, the patient was placed on chlorhexidine rinses for use 3 times daily as instructed. We will not prescribe antibiotic therapy as there is no evidence of purulence or fever or swelling.  The patient is to contact dental medicine  if acute problems arise in this area of the exposed bone.     2. Discussion of findings with medical team and coordination of future medical and dental care as indicated.  Charlynne Pander, DDS

## 2013-01-13 ENCOUNTER — Other Ambulatory Visit (HOSPITAL_COMMUNITY): Payer: Medicare Other

## 2013-01-13 ENCOUNTER — Ambulatory Visit (HOSPITAL_COMMUNITY): Payer: Medicare Other

## 2013-01-13 ENCOUNTER — Telehealth (HOSPITAL_COMMUNITY): Payer: Self-pay | Admitting: Dentistry

## 2013-01-13 NOTE — Telephone Encounter (Signed)
01/13/2013  Patient:            [name] [name] Date of Birth:  08/07/1955 MRN:                [id number]  I called patient at home to determine if he had been contacted by [facility]. The patient indicates that he is currently scheduled for an appointment on January 28, 2013 at 10 AM. The patient was reminded that he should not miss that appointment. The patient is to contact dental medicine with the results of that consultation after the appointment. The patient was also reminded to maintain his use of chlorhexidine rinses as prescribed. Dr. Kristin Bruins

## 2013-01-14 ENCOUNTER — Encounter: Payer: Self-pay | Admitting: Family Medicine

## 2013-01-14 ENCOUNTER — Ambulatory Visit (INDEPENDENT_AMBULATORY_CARE_PROVIDER_SITE_OTHER): Payer: Medicare Other | Admitting: Family Medicine

## 2013-01-14 ENCOUNTER — Ambulatory Visit: Payer: Self-pay | Admitting: Family Medicine

## 2013-01-14 VITALS — BP 118/90 | Ht 69.0 in | Wt 159.0 lb

## 2013-01-14 DIAGNOSIS — J329 Chronic sinusitis, unspecified: Secondary | ICD-10-CM

## 2013-01-14 DIAGNOSIS — K219 Gastro-esophageal reflux disease without esophagitis: Secondary | ICD-10-CM

## 2013-01-14 DIAGNOSIS — Z79899 Other long term (current) drug therapy: Secondary | ICD-10-CM

## 2013-01-14 DIAGNOSIS — E782 Mixed hyperlipidemia: Secondary | ICD-10-CM

## 2013-01-14 MED ORDER — ALBUTEROL SULFATE HFA 108 (90 BASE) MCG/ACT IN AERS: INHALATION_SPRAY | RESPIRATORY_TRACT | Status: DC

## 2013-01-14 MED ORDER — PRAVASTATIN SODIUM 40 MG PO TABS: ORAL_TABLET | ORAL | Status: DC

## 2013-01-14 MED ORDER — LEVOFLOXACIN 500 MG PO TABS: 500.0000 mg | ORAL_TABLET | Freq: Every day | ORAL | Status: AC

## 2013-01-14 NOTE — Progress Notes (Signed)
  Subjective:    Patient ID: [name] [name], male    DOB: 04/07/1956, 57 y.o.   MRN: [id number]  HPI Breathing has stabilized, but often needs inhaler.  Patient claims compliance with his lipid medicine. Known history of coronary artery disease. No chest pain. Mostly watching his diet.  Patient has developed necrosis of the jaw. His oncologist feels maybe related to his medication. Due to see specialists soon  Patient has had frontal headache nasal congestion positive discharge low-grade fever.  Patient has history of wax increasing in his ears.   Review of Systems No chest pain no shortness of breath no abdominal pain ROS otherwise negative    Objective:   Physical Exam  Alert. Lungs clear. Heart regular rate and rhythm. HEENT moderate nasal congestion frontal tenderness. Left upper maxillary region chronic ulceration with bone exposure noted. No true wheezes at this time. Vital stable. Cerumen impaction.      Assessment & Plan:  Impression hyperlipidemia status uncertain. #2 jaw necrosis due to see specialists soon #3 coronary artery disease clinically silent #4 hyperlipidemia status uncertain. #5 cerumen impaction. Plan followup in one week for wax removal. Appropriate blood work. Levaquin daily for 10 days. Necrosis discussed be sure to followup with specialist. 25 minutes spent most in discussion. WSL

## 2013-01-15 ENCOUNTER — Encounter (HOSPITAL_COMMUNITY): Payer: Medicare Other | Attending: Oncology | Admitting: Oncology

## 2013-01-15 ENCOUNTER — Other Ambulatory Visit (HOSPITAL_COMMUNITY): Payer: Self-pay | Admitting: Oncology

## 2013-01-15 ENCOUNTER — Encounter (HOSPITAL_COMMUNITY): Payer: Self-pay | Admitting: Oncology

## 2013-01-15 VITALS — BP 128/76 | HR 49 | Temp 97.5°F | Resp 15 | Wt 156.2 lb

## 2013-01-15 DIAGNOSIS — M810 Age-related osteoporosis without current pathological fracture: Secondary | ICD-10-CM

## 2013-01-15 DIAGNOSIS — M8708 Idiopathic aseptic necrosis of bone, other site: Secondary | ICD-10-CM

## 2013-01-15 DIAGNOSIS — C61 Malignant neoplasm of prostate: Secondary | ICD-10-CM | POA: Insufficient documentation

## 2013-01-15 LAB — HEPATIC FUNCTION PANEL
ALT: <8 U/L (ref 0–53)
AST: 22 U/L (ref 0–37)
Albumin: 4.8 g/dL (ref 3.5–5.2)
Alkaline Phosphatase: 55 U/L (ref 39–117)
Bilirubin, Direct: 0.1 mg/dL (ref 0.0–0.3)
Indirect Bilirubin: 0.6 mg/dL (ref 0.0–0.9)
Total Bilirubin: 0.7 mg/dL (ref 0.3–1.2)
Total Protein: 7.3 g/dL (ref 6.0–8.3)

## 2013-01-15 LAB — CBC WITH DIFFERENTIAL/PLATELET
Basophils Absolute: 0 10*3/uL (ref 0.0–0.1)
Basophils Relative: 1 % (ref 0–1)
Eosinophils Absolute: 0.2 10*3/uL (ref 0.0–0.7)
Eosinophils Relative: 3 % (ref 0–5)
HCT: 38.9 % — ABNORMAL LOW (ref 39.0–52.0)
Hemoglobin: 12.9 g/dL — ABNORMAL LOW (ref 13.0–17.0)
Lymphocytes Relative: 33 % (ref 12–46)
Lymphs Abs: 2.1 10*3/uL (ref 0.7–4.0)
MCH: 30.8 pg (ref 26.0–34.0)
MCHC: 33.2 g/dL (ref 30.0–36.0)
MCV: 92.8 fL (ref 78.0–100.0)
Monocytes Absolute: 0.5 10*3/uL (ref 0.1–1.0)
Monocytes Relative: 8 % (ref 3–12)
Neutro Abs: 3.7 10*3/uL (ref 1.7–7.7)
Neutrophils Relative %: 56 % (ref 43–77)
Platelets: 324 10*3/uL (ref 150–400)
RBC: 4.19 MIL/uL — ABNORMAL LOW (ref 4.22–5.81)
RDW: 13.4 % (ref 11.5–15.5)
WBC: 6.6 10*3/uL (ref 4.0–10.5)

## 2013-01-15 LAB — COMPREHENSIVE METABOLIC PANEL
ALT: 8 U/L (ref 0–53)
AST: 19 U/L (ref 0–37)
Albumin: 4.4 g/dL (ref 3.5–5.2)
Alkaline Phosphatase: 61 U/L (ref 39–117)
BUN: 10 mg/dL (ref 6–23)
CO2: 29 mEq/L (ref 19–32)
Calcium: 9.2 mg/dL (ref 8.4–10.5)
Chloride: 101 mEq/L (ref 96–112)
Creatinine, Ser: 0.99 mg/dL (ref 0.50–1.35)
GFR calc Af Amer: >90 mL/min (ref >90)
GFR calc non Af Amer: 88 mL/min — ABNORMAL LOW (ref >90)
Glucose, Bld: 100 mg/dL — ABNORMAL HIGH (ref 70–99)
Potassium: 4.1 mEq/L (ref 3.5–5.1)
Sodium: 138 mEq/L (ref 135–145)
Total Bilirubin: 0.6 mg/dL (ref 0.3–1.2)
Total Protein: 7.8 g/dL (ref 6.0–8.3)

## 2013-01-15 LAB — LIPID PANEL
Cholesterol: 184 mg/dL (ref 0–200)
HDL: 58 mg/dL (ref >39)
LDL Cholesterol: 98 mg/dL (ref 0–99)
Total CHOL/HDL Ratio: 3.2 Ratio
Triglycerides: 138 mg/dL (ref <150)
VLDL: 28 mg/dL (ref 0–40)

## 2013-01-15 LAB — PSA: PSA: <0.01 ng/mL — ABNORMAL LOW (ref <=4.00)

## 2013-01-15 MED ORDER — OXYCODONE-ACETAMINOPHEN 10-325 MG PO TABS: 1.0000 | ORAL_TABLET | Freq: Four times a day (QID) | ORAL | Status: DC | PRN

## 2013-01-15 MED ORDER — ALPRAZOLAM 0.5 MG PO TABS: ORAL_TABLET | ORAL | Status: DC

## 2013-01-15 MED ORDER — OXYCODONE HCL 20 MG PO TB12: 20.0000 mg | ORAL_TABLET | Freq: Three times a day (TID) | ORAL | Status: DC

## 2013-01-15 NOTE — Patient Instructions (Addendum)
[facility] Discharge Instructions  RECOMMENDATIONS MADE BY THE CONSULTANT AND ANY TEST RESULTS WILL BE SENT TO YOUR REFERRING PHYSICIAN.  Refills given for Percocet, Oxycontin, and Xanax. Continue with your care at [facility]. Denosumab (Xgeva) injections are being discontinued. We will decide whether or not to continue with Lupron injections. Lab work today. Labs again in 2 months prior to MD appointment. MD appointment in 2 months.  Thank you for choosing [facility] to provide your oncology and hematology care.  To afford each patient quality time with our providers, please arrive at least 15 minutes before your scheduled appointment time.  With your help, our goal is to use those 15 minutes to complete the necessary work-up to ensure our physicians have the information they need to help with your evaluation and healthcare recommendations.    Effective January 1st, 2014, we ask that you re-schedule your appointment with our physicians should you arrive 10 or more minutes late for your appointment.  We strive to give you quality time with our providers, and arriving late affects you and other patients whose appointments are after yours.    Again, thank you for choosing [facility].  Our hope is that these requests will decrease the amount of time that you wait before being seen by our physicians.       _____________________________________________________________  Should you have questions after your visit to [facility], please contact our office at [phone] between the hours of 8:30 a.m. and 5:00 p.m.  Voicemails left after 4:30 p.m. will not be returned until the following business day.  For prescription refill requests, have your pharmacy contact our office with your prescription refill request.

## 2013-01-15 NOTE — Progress Notes (Signed)
[name], MD [address] [place] [address]  Prostate ca  CURRENT THERAPY: Lupron 45 mg every 6 months  INTERVAL HISTORY: KHYLIN GUTRIDGE 57 y.o. 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 Margo Aye in [facility]. He was then of lymph node involvement but bone scan was negative.  The patient was recently diagnosed with osteonecrosis of jaw.  He was seen by Dr. Kristin Bruins (Onc Dentist) who referred him to an oral surgeon at [facility] for evaluation and treatment on 01/28/2013.   Dr. Lenell Antu note is reviewed.  He debates the need for continues ADT.  NCCN guidelines reports, "A phase 3 trial compared continuous ADT to intermittent ADT, but the study was statistically inconclusive for non-inferiority, however, quality of life measures for erectile dysfunction and mental health were better in the intermittent ADT arm after 3 months off ADT compared to the continuous ADT arm."  "Close monitoring of PSA and testosterone levels and possibly imaging is required when using intermittent ADT, especially during off-treatment periods, and patients may need to switch to continuous ADT upon signs of disease progression."  He requests a refill on his pain medication, Percocet, Oxycontin, in addition to Xanax.  This is provided to him today as he is due for this per his scheduled refills.   He reports a small left leg nodule that is soft in nature and at a site of trauma when he bumped it against a 2 x 4" while working.  There is no ecchymosis and it is nontender.  I suspect it is a soft tissue injury that will heal without intervention.   He reports that he feels well.  His energy is solid. He denies any complaints.  Oncologically, ROS questioning is negative.     Past Medical History  Diagnosis Date  . Coronary artery disease     DES to LAD 1/12  . COPD  (chronic obstructive pulmonary disease)   . Sigmoid diverticulosis   . DDD (degenerative disc disease)   . Anxiety   . Depression   . Psoriasis   . Tubular adenoma     Colonoscopy 1/12  . Horseshoe kidney     Single kidney  . Prostate cancer     Metastatic, stage IV  - Dr. Mariel Sleet  . Internal hemorrhoids     Colonoscopy 1/12  . Drug reaction     Pamidronate - induced skin toxicity, Zometa - induced toxicity  . GERD (gastroesophageal reflux disease)   . Gastritis   . Prostate ca 12/29/2010  . Osteoarthritis   . 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     is allergic to pamidronate and zoledronic acid.  Mr. Bochicchio had no medications administered during this visit.  Past Surgical History  Procedure Laterality Date  . Anterior fusion cervical spine    . Posterior fusion cervical spine    . Left foot surgery    . Prostatectomy  02/28/2006    Dr. Margo Aye in [place], [place].  Marland Kitchen Appendectomy    . Heart stents  06/2010    2    Denies any headaches, dizziness, double vision, fevers, chills, night sweats, nausea, vomiting, diarrhea, constipation, chest pain, heart palpitations, shortness of breath, blood in  stool, black tarry stool, urinary pain, urinary burning, urinary frequency, hematuria.   PHYSICAL EXAMINATION  ECOG PERFORMANCE STATUS: 0 - Asymptomatic  Filed Vitals:   01/15/13 1000  BP: 128/76  Pulse: 49  Temp: 97.5 F (36.4 C)  Resp: 15    GENERAL:alert, no distress, well nourished, well developed, comfortable, cooperative and smiling SKIN: skin color, texture, turgor are normal, no rashes or significant lesions HEAD: Normocephalic, No masses, lesions, tenderness or abnormalities EYES: normal, PERRLA, EOMI, Conjunctiva are pink and non-injected EARS: External ears normal OROPHARYNX:mucous membranes are moist  NECK:  supple, no adenopathy, thyroid normal size, non-tender, without nodularity, no stridor, non-tender, trachea midline LYMPH:  no palpable lymphadenopathy BREAST:not examined LUNGS: clear to auscultation and percussion HEART: regular rate & rhythm, no murmurs, no gallops, S1 normal and S2 normal ABDOMEN:abdomen soft, non-tender, obese and normal bowel sounds BACK: Back symmetric, no curvature. EXTREMITIES:less then 2 second capillary refill, no joint deformities, effusion, or inflammation, no edema, no skin discoloration, no clubbing, no cyanosis, left lower leg, soft tissue swelling, unimpressive, without erythema or ecchymosis or edema. NEURO: alert & oriented x 3 with fluent speech, no focal motor/sensory deficits, gait normal    LABORATORY DATA: CBC    Component Value Date/Time   WBC 7.2 10/16/2012 1054   RBC 4.05* 10/16/2012 1054   HGB 12.8* 10/16/2012 1054   HCT 37.6* 10/16/2012 1054   PLT 264 10/16/2012 1054   MCV 92.8 10/16/2012 1054   MCH 31.6 10/16/2012 1054   MCHC 34.0 10/16/2012 1054   RDW 14.3 10/16/2012 1054   LYMPHSABS 2.3 10/16/2012 1054   MONOABS 0.5 10/16/2012 1054   EOSABS 0.4 10/16/2012 1054   BASOSABS 0.0 10/16/2012 1054      Chemistry      Component Value Date/Time   NA 140 12/16/2012 1136   K 4.3 12/16/2012 1136   CL 105 12/16/2012 1136   CO2 27 12/16/2012 1136   BUN 13 12/16/2012 1136   CREATININE 0.99 12/16/2012 1136      Component Value Date/Time   CALCIUM 9.2 12/16/2012 1136   ALKPHOS 54 12/16/2012 1136   AST 22 12/16/2012 1136   ALT 9 12/16/2012 1136   BILITOT 0.4 12/16/2012 1136     Lab Results  Component Value Date   PSA <0.01* 10/16/2012   PSA <0.01* 09/18/2012   PSA <0.01* 08/19/2012     ASSESSMENT:  1. Stage  IV (T1 C., N1) poorly differentiated adenocarcinoma prostate status post radical prostatectomy on 02/28/2006 by Dr. Margo Aye in [facility]. He was then of lymph node involvement but bone scan was negative. 2. Osteoporosis, for which he was on Denosumab  for 3. Osteonecrosis of jaw, noted in left upper gumline.   Patient Active Problem List   Diagnosis Date Noted  . Prostate ca 12/29/2010  . Mixed hyperlipidemia 08/25/2010  . CORONARY ATHEROSCLEROSIS NATIVE CORONARY ARTERY 07/13/2010  . GERD 04/11/2010  . RECTAL BLEEDING 04/11/2010     PLAN: \ 1. I personally reviewed and went over laboratory results with the patient. 2. I personally reviewed and went over radiographic studies with the patient. 3. Chart reviewed.  4. Labs today: CBC diff, CMET, PSA 5. Labs in 8 weeks: CBC diff, CMET, Testosterone 6. Follow-up with [facility] Dental surgeon as scheduled on 01/28/2013 7. D/C Denosumab 8. Will debate the role of ADT moving forward (not due until Jan 2015) 9. Rx for Oxycontin, Percocet, and Xanax 10. Return in 2 months for follow-up   THERAPY PLAN:  Denosumab D/C'd due to osteonecrosis of jaw.  Will debate role of ADT continuous versus holding and observing. NCCN guidelines reviewed. Will see him back in 2 months for follow-up with labs.   All questions were answered. The patient knows to call the clinic with any problems, questions or concerns. We can certainly see the patient much sooner if necessary.  Patient and plan discussed with Dr. Benita Gutter and he is in agreement with the aforementioned.   Maurizio Geno

## 2013-01-18 DIAGNOSIS — J329 Chronic sinusitis, unspecified: Secondary | ICD-10-CM | POA: Insufficient documentation

## 2013-01-21 ENCOUNTER — Ambulatory Visit (INDEPENDENT_AMBULATORY_CARE_PROVIDER_SITE_OTHER): Payer: Medicare Other | Admitting: *Deleted

## 2013-01-21 DIAGNOSIS — H6123 Impacted cerumen, bilateral: Secondary | ICD-10-CM

## 2013-01-21 DIAGNOSIS — H6120 Impacted cerumen, unspecified ear: Secondary | ICD-10-CM

## 2013-01-21 NOTE — Progress Notes (Signed)
Bilateral ear irrigation without difficulty. 

## 2013-01-23 NOTE — Telephone Encounter (Signed)
Thanks for this information Dr Kristin Bruins.

## 2013-01-26 ENCOUNTER — Encounter: Payer: Self-pay | Admitting: Family Medicine

## 2013-02-10 ENCOUNTER — Other Ambulatory Visit (HOSPITAL_COMMUNITY): Payer: Medicare Other

## 2013-02-10 ENCOUNTER — Ambulatory Visit (HOSPITAL_COMMUNITY): Payer: Medicare Other

## 2013-02-13 ENCOUNTER — Other Ambulatory Visit: Payer: Self-pay | Admitting: Family Medicine

## 2013-02-13 ENCOUNTER — Other Ambulatory Visit (HOSPITAL_COMMUNITY): Payer: Self-pay | Admitting: Oncology

## 2013-02-13 DIAGNOSIS — C61 Malignant neoplasm of prostate: Secondary | ICD-10-CM

## 2013-02-13 MED ORDER — OXYCODONE-ACETAMINOPHEN 10-325 MG PO TABS: 1.0000 | ORAL_TABLET | Freq: Four times a day (QID) | ORAL | Status: DC | PRN

## 2013-02-13 MED ORDER — OXYCODONE HCL 20 MG PO TB12: 20.0000 mg | ORAL_TABLET | Freq: Three times a day (TID) | ORAL | Status: DC

## 2013-03-10 ENCOUNTER — Other Ambulatory Visit (HOSPITAL_COMMUNITY): Payer: Medicare Other

## 2013-03-10 ENCOUNTER — Telehealth: Payer: Self-pay | Admitting: Family Medicine

## 2013-03-10 ENCOUNTER — Ambulatory Visit (HOSPITAL_COMMUNITY): Payer: Medicare Other

## 2013-03-10 NOTE — Telephone Encounter (Signed)
Made appt. To recheck ears.

## 2013-03-10 NOTE — Telephone Encounter (Signed)
Still having sinus issues and problems with right ear pain again. He just had his ears cleaned out in August by nurse. Please advise. He would like to know if he needs to come back in to have his ears cleaned again?

## 2013-03-10 NOTE — Telephone Encounter (Signed)
Needs ov

## 2013-03-11 ENCOUNTER — Encounter: Payer: Self-pay | Admitting: Family Medicine

## 2013-03-11 ENCOUNTER — Ambulatory Visit (INDEPENDENT_AMBULATORY_CARE_PROVIDER_SITE_OTHER): Payer: Medicare Other | Admitting: Family Medicine

## 2013-03-11 VITALS — BP 130/90 | Temp 97.8°F | Ht 69.0 in | Wt 154.0 lb

## 2013-03-11 DIAGNOSIS — Z23 Encounter for immunization: Secondary | ICD-10-CM

## 2013-03-11 DIAGNOSIS — J329 Chronic sinusitis, unspecified: Secondary | ICD-10-CM

## 2013-03-11 MED ORDER — LEVOFLOXACIN 500 MG PO TABS: 500.0000 mg | ORAL_TABLET | Freq: Every day | ORAL | Status: AC

## 2013-03-11 NOTE — Progress Notes (Signed)
  Subjective:    Patient ID: [name] [name], male    DOB: 1956/05/14, 57 y.o.   MRN: [id number]  Sinusitis This is a new problem. The current episode started in the past 7 days. Associated symptoms include congestion, coughing, diaphoresis, ear pain, headaches and sinus pressure. (Vomiting and diarrhea ) Treatments tried: ear drops. The treatment provided mild relief.    Nasal congestion, stuffy at times,  Left ear ringing at times.  Review of Systems  Constitutional: Positive for diaphoresis.  HENT: Positive for congestion, ear pain and sinus pressure.   Respiratory: Positive for cough.   Neurological: Positive for headaches.       Objective:   Physical Exam  Alert mild malaise. HEENT positive nasal congestion. TMs eardrums retracted. No significant effect Some frontal tenderness     Assessment & Plan:  Impression #1 rhinosinusitis plan antibiotics prescribed. Symptomatic care discussed. WSL

## 2013-03-12 ENCOUNTER — Other Ambulatory Visit (HOSPITAL_COMMUNITY): Payer: Self-pay | Admitting: Oncology

## 2013-03-12 ENCOUNTER — Other Ambulatory Visit: Payer: Self-pay | Admitting: Family Medicine

## 2013-03-12 ENCOUNTER — Telehealth (HOSPITAL_COMMUNITY): Payer: Self-pay | Admitting: *Deleted

## 2013-03-12 DIAGNOSIS — C61 Malignant neoplasm of prostate: Secondary | ICD-10-CM

## 2013-03-12 MED ORDER — OXYCODONE HCL ER 20 MG PO T12A: 20.0000 mg | EXTENDED_RELEASE_TABLET | Freq: Three times a day (TID) | ORAL | Status: DC

## 2013-03-12 MED ORDER — OXYCODONE-ACETAMINOPHEN 10-325 MG PO TABS: 1.0000 | ORAL_TABLET | Freq: Four times a day (QID) | ORAL | Status: DC | PRN

## 2013-03-12 NOTE — Telephone Encounter (Signed)
Armari needs refills on his Oxycontin and Percocet and he will pick them up after 12 tomorrow.

## 2013-03-17 ENCOUNTER — Encounter (HOSPITAL_COMMUNITY): Payer: Medicare Other | Attending: Oncology

## 2013-03-17 DIAGNOSIS — C7951 Secondary malignant neoplasm of bone: Secondary | ICD-10-CM | POA: Insufficient documentation

## 2013-03-17 DIAGNOSIS — C801 Malignant (primary) neoplasm, unspecified: Secondary | ICD-10-CM | POA: Insufficient documentation

## 2013-03-17 DIAGNOSIS — C61 Malignant neoplasm of prostate: Secondary | ICD-10-CM | POA: Insufficient documentation

## 2013-03-17 LAB — COMPREHENSIVE METABOLIC PANEL
ALT: 9 U/L (ref 0–53)
AST: 21 U/L (ref 0–37)
Albumin: 3.8 g/dL (ref 3.5–5.2)
Alkaline Phosphatase: 73 U/L (ref 39–117)
BUN: 7 mg/dL (ref 6–23)
CO2: 29 mEq/L (ref 19–32)
Calcium: 9.1 mg/dL (ref 8.4–10.5)
Chloride: 103 mEq/L (ref 96–112)
Creatinine, Ser: 0.94 mg/dL (ref 0.50–1.35)
GFR calc Af Amer: >90 mL/min (ref >90)
GFR calc non Af Amer: >90 mL/min (ref >90)
Glucose, Bld: 99 mg/dL (ref 70–99)
Potassium: 4 mEq/L (ref 3.5–5.1)
Sodium: 140 mEq/L (ref 135–145)
Total Bilirubin: 0.3 mg/dL (ref 0.3–1.2)
Total Protein: 7.2 g/dL (ref 6.0–8.3)

## 2013-03-17 LAB — CBC WITH DIFFERENTIAL/PLATELET
Basophils Absolute: 0.1 10*3/uL (ref 0.0–0.1)
Basophils Relative: 1 % (ref 0–1)
Eosinophils Absolute: 0.3 10*3/uL (ref 0.0–0.7)
Eosinophils Relative: 4 % (ref 0–5)
HCT: 36.6 % — ABNORMAL LOW (ref 39.0–52.0)
Hemoglobin: 12.4 g/dL — ABNORMAL LOW (ref 13.0–17.0)
Lymphocytes Relative: 43 % (ref 12–46)
Lymphs Abs: 2.8 10*3/uL (ref 0.7–4.0)
MCH: 31.4 pg (ref 26.0–34.0)
MCHC: 33.9 g/dL (ref 30.0–36.0)
MCV: 92.7 fL (ref 78.0–100.0)
Monocytes Absolute: 0.7 10*3/uL (ref 0.1–1.0)
Monocytes Relative: 11 % (ref 3–12)
Neutro Abs: 2.7 10*3/uL (ref 1.7–7.7)
Neutrophils Relative %: 41 % — ABNORMAL LOW (ref 43–77)
Platelets: 302 10*3/uL (ref 150–400)
RBC: 3.95 MIL/uL — ABNORMAL LOW (ref 4.22–5.81)
RDW: 14.2 % (ref 11.5–15.5)
WBC: 6.5 10*3/uL (ref 4.0–10.5)

## 2013-03-17 LAB — PSA: PSA: <0.01 ng/mL — ABNORMAL LOW (ref <=4.00)

## 2013-03-17 NOTE — Progress Notes (Signed)
Labs drawn today for cbc/diff,cmp,psa,testosterone 

## 2013-03-18 LAB — TESTOSTERONE: Testosterone: 19 ng/dL — ABNORMAL LOW (ref 300–890)

## 2013-03-20 ENCOUNTER — Encounter (HOSPITAL_BASED_OUTPATIENT_CLINIC_OR_DEPARTMENT_OTHER): Payer: Medicare Other

## 2013-03-20 ENCOUNTER — Other Ambulatory Visit (HOSPITAL_COMMUNITY): Payer: Self-pay | Admitting: Hematology and Oncology

## 2013-03-20 ENCOUNTER — Encounter (HOSPITAL_COMMUNITY): Payer: Self-pay

## 2013-03-20 ENCOUNTER — Ambulatory Visit (HOSPITAL_COMMUNITY)
Admission: RE | Admit: 2013-03-20 | Discharge: 2013-03-20 | Disposition: A | Discharge status: Home / Self Care | Payer: Medicare Other | Source: Ambulatory Visit | Attending: Hematology and Oncology | Admitting: Hematology and Oncology

## 2013-03-20 VITALS — BP 127/78 | HR 56 | Temp 97.6°F | Resp 18 | Wt 154.9 lb

## 2013-03-20 DIAGNOSIS — M8708 Idiopathic aseptic necrosis of bone, other site: Secondary | ICD-10-CM

## 2013-03-20 DIAGNOSIS — M25519 Pain in unspecified shoulder: Secondary | ICD-10-CM | POA: Insufficient documentation

## 2013-03-20 DIAGNOSIS — C61 Malignant neoplasm of prostate: Secondary | ICD-10-CM

## 2013-03-20 DIAGNOSIS — C7951 Secondary malignant neoplasm of bone: Secondary | ICD-10-CM

## 2013-03-20 DIAGNOSIS — M25511 Pain in right shoulder: Secondary | ICD-10-CM

## 2013-03-20 DIAGNOSIS — M8710 Osteonecrosis due to drugs, unspecified bone: Secondary | ICD-10-CM | POA: Insufficient documentation

## 2013-03-20 MED ORDER — METHYLPREDNISOLONE 4 MG PO KIT: 8.0000 mg | PACK | Freq: Every evening | ORAL | Status: DC

## 2013-03-20 MED ORDER — OXYCODONE-ACETAMINOPHEN 10-325 MG PO TABS: 1.0000 | ORAL_TABLET | Freq: Four times a day (QID) | ORAL | Status: DC | PRN

## 2013-03-20 MED ORDER — METHYLPREDNISOLONE 4 MG PO KIT: 8.0000 mg | PACK | Freq: Every morning | ORAL | Status: DC

## 2013-03-20 MED ORDER — METHYLPREDNISOLONE 4 MG PO KIT: 4.0000 mg | PACK | Freq: Four times a day (QID) | ORAL | Status: DC

## 2013-03-20 MED ORDER — OXYCODONE HCL ER 20 MG PO T12A: 20.0000 mg | EXTENDED_RELEASE_TABLET | Freq: Three times a day (TID) | ORAL | Status: DC

## 2013-03-20 MED ORDER — METHYLPREDNISOLONE 4 MG PO KIT: 4.0000 mg | PACK | ORAL | Status: DC

## 2013-03-20 MED ORDER — METHYLPREDNISOLONE 4 MG PO KIT: 4.0000 mg | PACK | Freq: Three times a day (TID) | ORAL | Status: DC

## 2013-03-20 NOTE — Progress Notes (Signed)
[facility] OFFICE PROGRESS NOTE  [name], MD [address] [place] [address]  DIAGNOSIS: Prostate cancer  Bone metastases  Shoulder pain, acute, right  Chief Complaint  Patient presents with  . Prostate Cancer  . Joint Pain    right shoulder    CURRENT THERAPY: Eligard intramuscularly every 6 months last treatment in July 2014  INTERVAL HISTORY: [name] [name] 57 y.o. 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  Over the past to 3 weeks she's developed increasing pain in the right shoulder being unable to raise it. He does have continued pain in left upper jaw from osteonecrosis. Rivka Barbara has been stopped. He denies any fever, night sweats, breast pain, severe hot flashes, diarrhea, constipation, dysuria, hematuria, lower extremity swelling or redness, skin rash, headache, or seizures.  MEDICAL HISTORY: Past Medical History  Diagnosis Date  . Coronary artery disease     DES to LAD 1/12  . COPD (chronic obstructive pulmonary disease)   . Sigmoid diverticulosis   . DDD (degenerative disc disease)   . Anxiety   . Depression   . Psoriasis   . Tubular adenoma     Colonoscopy 1/12  . Horseshoe kidney     Single kidney  . Prostate cancer     Metastatic, stage IV  - Dr. Mariel Sleet  . Internal hemorrhoids     Colonoscopy 1/12  . Drug reaction     Pamidronate - induced skin toxicity, Zometa - induced toxicity  . GERD (gastroesophageal reflux disease)   . Gastritis   . Prostate ca 12/29/2010  . Osteoarthritis   . 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   Stage IV (T1 C., N1) poorly differentiated adenocarcinoma prostate status post  radical prostatectomy on 02/28/2006 by Dr. Margo Aye in [facility]. He was then of lymph node involvement but bone scan was negative  ALLERGIES:  is allergic to pamidronate and zoledronic acid.  MEDICATIONS: has a current medication list which includes the following prescription(s): albuterol, alprazolam, aspirin, calcipotriene, calcipotriene-betamethasone, calcium carbonate-vitamin d, capex, chlorhexidine, clobex spray, lumbar back brace/support pad, gabapentin, leuprolide (6 month), levofloxacin, meclizine, metoprolol succinate, nitrostat, oxycodone, oxycodone-acetaminophen, pantoprazole, pravastatin, and diazepam.  SURGICAL HISTORY:  Past Surgical History  Procedure Laterality Date  . Anterior fusion cervical spine    . Posterior fusion cervical spine    . Left foot surgery    . Prostatectomy  02/28/2006    Dr. Margo Aye in [place], [place].  Marland Kitchen Appendectomy    . Heart stents  06/2010    2    FAMILY HISTORY: family history includes Alzheimer's disease in his mother; Heart attack in his brother; Lung cancer in his father.  SOCIAL HISTORY:  reports that he quit smoking about 5 years ago. His smoking use included Cigarettes. He has a 15 pack-year smoking history. He has never used smokeless tobacco. He reports that he does not drink alcohol or use illicit drugs.  REVIEW OF SYSTEMS:  Other than that discussed above is noncontributory.  PHYSICAL EXAMINATION: ECOG PERFORMANCE STATUS: 1 - Symptomatic but completely ambulatory  Blood pressure 127/78, pulse 56, temperature 97.6 F (36.4 C), temperature source Oral, resp. rate 18, weight 154 lb 14.4 oz (70.262 kg).  GENERAL:alert, no distress and comfortable SKIN: skin color, texture, turgor are normal, no rashes or significant lesions EYES: PERLA; Conjunctiva are pink and non-injected, sclera clear OROPHARYNX:no exudate, no erythema on lips, buccal mucosa, or tongue. NECK: supple, thyroid normal size, non-tender, without nodularity. No masses CHEST:  Normal AP diameter with bilateral gynecomastia. LYMPH:  no palpable lymphadenopathy in the cervical, axillary or inguinal LUNGS: clear to auscultation and percussion with normal breathing effort. Scattered rhonchi at the base. HEART: regular rate & rhythm and no murmurs and no lower extremity edema ABDOMEN:abdomen soft, non-tender and normal bowel sounds MUSCULOSKELETAL:no cyanosis of digits and no clubbing. Decreased range of motion of the right upper extremity able to abduct only 30. No tenderness over the deltoid tendon.  NEURO: alert & oriented x 3 with fluent speech, no focal motor/sensory deficits   LABORATORY DATA: Infusion on 03/17/2013  Component Date Value Range Status  . WBC 03/17/2013 6.5  4.0 - 10.5 K/uL Final  . RBC 03/17/2013 3.95* 4.22 - 5.81 MIL/uL Final  . Hemoglobin 03/17/2013 12.4* 13.0 - 17.0 g/dL Final  . HCT 03/31/2535 36.6* 39.0 - 52.0 % Final  . MCV 03/17/2013 92.7  78.0 - 100.0 fL Final  . MCH 03/17/2013 31.4  26.0 - 34.0 pg Final  . MCHC 03/17/2013 33.9  30.0 - 36.0 g/dL Final  . RDW 64/40/3474 14.2  11.5 - 15.5 % Final  . Platelets 03/17/2013 302  150 - 400 K/uL Final  . Neutrophils Relative % 03/17/2013 41* 43 - 77 % Final  . Neutro Abs 03/17/2013 2.7  1.7 - 7.7 K/uL Final  . Lymphocytes Relative 03/17/2013 43  12 - 46 % Final  . Lymphs Abs 03/17/2013 2.8  0.7 - 4.0 K/uL Final  . Monocytes Relative 03/17/2013 11  3 - 12 % Final  . Monocytes Absolute 03/17/2013 0.7  0.1 - 1.0 K/uL Final  . Eosinophils Relative 03/17/2013 4  0 - 5 % Final  . Eosinophils Absolute 03/17/2013 0.3  0.0 - 0.7 K/uL Final  . Basophils Relative 03/17/2013 1  0 - 1 % Final  . Basophils Absolute 03/17/2013 0.1  0.0 - 0.1 K/uL Final  . Sodium 03/17/2013 140  135 - 145 mEq/L Final  . Potassium 03/17/2013 4.0  3.5 - 5.1 mEq/L Final  . Chloride 03/17/2013 103  96 - 112 mEq/L Final  . CO2 03/17/2013 29  19 - 32 mEq/L Final  . Glucose, Bld 03/17/2013 99  70 - 99 mg/dL Final  . BUN  25/95/6387 7  6 - 23 mg/dL Final  . Creatinine, Ser 03/17/2013 0.94  0.50 - 1.35 mg/dL Final  . Calcium 56/43/3295 9.1  8.4 - 10.5 mg/dL Final  . Total Protein 03/17/2013 7.2  6.0 - 8.3 g/dL Final  . Albumin 18/84/1660 3.8  3.5 - 5.2 g/dL Final  . AST 63/06/6008 21  0 - 37 U/L Final  . ALT 03/17/2013 9  0 - 53 U/L Final  . Alkaline Phosphatase 03/17/2013 73  39 - 117 U/L Final  . Total Bilirubin 03/17/2013 0.3  0.3 - 1.2 mg/dL Final  . GFR calc non Af Amer 03/17/2013 >90  >90 mL/min Final  . GFR calc Af Amer 03/17/2013 >90  >90 mL/min Final   Comment: (NOTE)                          The eGFR has been calculated using the CKD EPI equation.  This calculation has not been validated in all clinical situations.                          eGFR's persistently <90 mL/min signify possible Chronic Kidney                          Disease.  Marland Kitchen PSA 03/17/2013 <0.01* <=4.00 ng/mL Final   Comment: (NOTE)                          Result repeated and verified.                          Test Methodology: ECLIA PSA (Electrochemiluminescence Immunoassay)                          For PSA values from 2.5-4.0, particularly in younger men <60 years                          old, the AUA and NCCN suggest testing for % Free PSA (3515) and                          evaluation of the rate of increase in PSA (PSA velocity).                          Performed at [facility]  . Testosterone 03/17/2013 19* 300 - 890 ng/dL Final   Comment: (NOTE)                                   Tanner Stage       Male              Male                                       I              < 30 ng/dL        < 10 ng/dL                                       II             < 150 ng/dL       < 30 ng/dL                                       III            100-320 ng/dL     < 35 ng/dL                                       IV             200-970 ng/dL     40-98 ng/dL  V/Adult         300-890 ng/dL     04-54 ng/dL                          Performed at [facility]    PATHOLOGY:  Urinalysis No results found for this basename: colorurine, appearanceur, labspec, phurine, glucoseu, hgbur, bilirubinur, ketonesur, proteinur, urobilinogen, nitrite, leukocytesur    RADIOGRAPHIC STUDIES: No results found.  ASSESSMENT: #1. Stage IV prostate cancer bone metastases, PSA less than 0.01, responding well to Eligard every 6 months. #2. Bursitis of the right shoulder. #3. 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   PLAN: #1. Medrol Dosepak. #2. Continue OxyContin and oxycodone for generalized bone pain. #3. Continue meticulous oral hygiene and followup with dentist scheduled for 03/31/2013. #4. Followup in January 20 15th for Eligard and lab tests.   All questions were answered. The patient knows to call the clinic with any problems, questions or concerns. We can certainly see the patient much sooner if necessary.   I spent 25 minutes counseling the patient face to face. The total time spent in the appointment was 30 minutes.    Maurilio Lovely, MD 03/20/2013 10:55 AM

## 2013-03-20 NOTE — Patient Instructions (Signed)
[facility] Discharge Instructions  RECOMMENDATIONS MADE BY THE CONSULTANT AND ANY TEST RESULTS WILL BE SENT TO YOUR REFERRING PHYSICIAN.  EXAM FINDINGS BY THE PHYSICIAN TODAY AND SIGNS OR SYMPTOMS TO REPORT TO CLINIC OR PRIMARY PHYSICIAN: Exam and findings as discussed by Dr. Zigmund Daniel.  We will do an xray of your shoulder.  If there are any problems we will call you.  Report uncontrolled pain.  Keep seeing the dental surgeon.  MEDICATIONS PRESCRIBED:  Prednisone dose pak - take as directed.  INSTRUCTIONS/FOLLOW-UP: Follow-up in January.  Thank you for choosing [facility] to provide your oncology and hematology care.  To afford each patient quality time with our providers, please arrive at least 15 minutes before your scheduled appointment time.  With your help, our goal is to use those 15 minutes to complete the necessary work-up to ensure our physicians have the information they need to help with your evaluation and healthcare recommendations.    Effective January 1st, 2014, we ask that you re-schedule your appointment with our physicians should you arrive 10 or more minutes late for your appointment.  We strive to give you quality time with our providers, and arriving late affects you and other patients whose appointments are after yours.    Again, thank you for choosing [facility].  Our hope is that these requests will decrease the amount of time that you wait before being seen by our physicians.       _____________________________________________________________  Should you have questions after your visit to [facility], please contact our office at [phone] between the hours of 8:30 a.m. and 5:00 p.m.  Voicemails left after 4:30 p.m. will not be returned until the following business day.  For prescription refill requests, have your pharmacy contact our office with your prescription refill request.

## 2013-03-25 ENCOUNTER — Telehealth: Payer: Self-pay | Admitting: Family Medicine

## 2013-03-25 MED ORDER — LEVOFLOXACIN 500 MG PO TABS: 500.0000 mg | ORAL_TABLET | Freq: Every day | ORAL | Status: AC

## 2013-03-25 NOTE — Telephone Encounter (Signed)
Patient states they would like a refill on Levaquinn due to he still has symptom of a sinus infection.  [facility].  Please call patient

## 2013-03-25 NOTE — Telephone Encounter (Signed)
Left message on voicemail notifying patient that Rx was sent into pharmacy.

## 2013-03-25 NOTE — Telephone Encounter (Signed)
ok 

## 2013-03-26 ENCOUNTER — Telehealth (HOSPITAL_COMMUNITY): Payer: Self-pay | Admitting: *Deleted

## 2013-03-26 NOTE — Telephone Encounter (Signed)
[name] [name] called and wanted to talk with you about his medication for his pain.  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  His phone # [phone]. Wanted to know what else he could do or get another scrip for more when this runs out.

## 2013-03-30 ENCOUNTER — Telehealth (HOSPITAL_COMMUNITY): Payer: Self-pay

## 2013-03-30 NOTE — Telephone Encounter (Signed)
Ellouise Newer, PA-C at 03/30/2013 4:48 PM    Status: Signed             I will refill. Have him call us about 3 days before he needs refill.        Gaynelle Adu Delancey at 03/26/2013 12:55 PM    Status: Signed             Jobe called and wanted to talk with you about his medication for his pain. 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 His phone # [phone]. Wanted to know what else he could do or get another scrip for more when this runs out.   Message left for patient to contact clinic to discuss.

## 2013-03-30 NOTE — Telephone Encounter (Signed)
I will refill.  Have him call us about 3 days before he needs refill.

## 2013-03-31 ENCOUNTER — Other Ambulatory Visit (HOSPITAL_COMMUNITY): Payer: Self-pay | Admitting: Oncology

## 2013-03-31 DIAGNOSIS — C61 Malignant neoplasm of prostate: Secondary | ICD-10-CM

## 2013-03-31 MED ORDER — OXYCODONE-ACETAMINOPHEN 10-325 MG PO TABS: 1.0000 | ORAL_TABLET | Freq: Four times a day (QID) | ORAL | Status: DC | PRN

## 2013-04-02 ENCOUNTER — Other Ambulatory Visit: Payer: Self-pay | Admitting: Family Medicine

## 2013-04-03 ENCOUNTER — Ambulatory Visit: Payer: Medicare Other

## 2013-04-10 ENCOUNTER — Other Ambulatory Visit (HOSPITAL_COMMUNITY): Payer: Self-pay | Admitting: Oncology

## 2013-04-10 ENCOUNTER — Other Ambulatory Visit: Payer: Self-pay | Admitting: Family Medicine

## 2013-04-10 DIAGNOSIS — C61 Malignant neoplasm of prostate: Secondary | ICD-10-CM

## 2013-04-10 MED ORDER — ALPRAZOLAM 0.5 MG PO TABS: ORAL_TABLET | ORAL | Status: DC

## 2013-05-06 ENCOUNTER — Telehealth (HOSPITAL_COMMUNITY): Payer: Self-pay | Admitting: *Deleted

## 2013-05-06 ENCOUNTER — Other Ambulatory Visit (HOSPITAL_COMMUNITY): Payer: Self-pay | Admitting: Oncology

## 2013-05-06 DIAGNOSIS — C61 Malignant neoplasm of prostate: Secondary | ICD-10-CM

## 2013-05-06 MED ORDER — OXYCODONE-ACETAMINOPHEN 10-325 MG PO TABS: 1.0000 | ORAL_TABLET | Freq: Four times a day (QID) | ORAL | Status: DC | PRN

## 2013-05-06 MED ORDER — OXYCODONE HCL ER 20 MG PO T12A: 20.0000 mg | EXTENDED_RELEASE_TABLET | Freq: Three times a day (TID) | ORAL | Status: DC

## 2013-05-07 ENCOUNTER — Other Ambulatory Visit: Payer: Self-pay | Admitting: Family Medicine

## 2013-05-08 ENCOUNTER — Other Ambulatory Visit: Payer: Self-pay | Admitting: Family Medicine

## 2013-05-19 ENCOUNTER — Telehealth: Payer: Self-pay

## 2013-05-19 MED ORDER — NITROGLYCERIN 0.4 MG SL SUBL: 0.4000 mg | SUBLINGUAL_TABLET | SUBLINGUAL | Status: DC | PRN

## 2013-05-19 NOTE — Telephone Encounter (Signed)
rx sent to pharmacy by e-script PER PT HAS APT TOMORROW WITH ML

## 2013-05-19 NOTE — Telephone Encounter (Signed)
Patient needs NTG called in.

## 2013-05-20 ENCOUNTER — Encounter: Payer: Self-pay | Admitting: Physician Assistant

## 2013-05-20 ENCOUNTER — Ambulatory Visit (INDEPENDENT_AMBULATORY_CARE_PROVIDER_SITE_OTHER): Payer: Medicare Other | Admitting: Physician Assistant

## 2013-05-20 VITALS — BP 126/85 | HR 56 | Ht 69.0 in | Wt 153.0 lb

## 2013-05-20 DIAGNOSIS — T50904A Poisoning by unspecified drugs, medicaments and biological substances, undetermined, initial encounter: Secondary | ICD-10-CM

## 2013-05-20 DIAGNOSIS — E782 Mixed hyperlipidemia: Secondary | ICD-10-CM

## 2013-05-20 DIAGNOSIS — M8700 Idiopathic aseptic necrosis of unspecified bone: Secondary | ICD-10-CM

## 2013-05-20 DIAGNOSIS — M8710 Osteonecrosis due to drugs, unspecified bone: Secondary | ICD-10-CM

## 2013-05-20 DIAGNOSIS — I2510 Atherosclerotic heart disease of native coronary artery without angina pectoris: Secondary | ICD-10-CM

## 2013-05-20 MED ORDER — NITROGLYCERIN 0.4 MG SL SUBL: 0.4000 mg | SUBLINGUAL_TABLET | SUBLINGUAL | Status: DC | PRN

## 2013-05-20 NOTE — Assessment & Plan Note (Signed)
Stable on Pravachol. Lipids checked 4 months ago.

## 2013-05-20 NOTE — Patient Instructions (Addendum)
Your physician recommends that you schedule a follow-up appointment in: 6 months with Dr McDowell You will receive a reminder letter two months in advance reminding you to call and schedule your appointment. If you don't receive this letter, please contact our office.  Your physician recommends that you continue on your current medications as directed. Please refer to the Current Medication list given to you today.   

## 2013-05-20 NOTE — Assessment & Plan Note (Signed)
Patient has history of drug-eluting stent to the LAD in 2012. Had a 40% RCA at that time. He has sharp shooting chest pain approximately 4 times a year with activity relieved with nitroglycerin. We will refill his nitroglycerin and I've asked him to call if he has any increase in his angina. Otherwise he will see Dr. Diona Browner back in 6 months.

## 2013-05-20 NOTE — Progress Notes (Signed)
HPI:   This is a 57 year old patient of Dr. Diona Browner who has history of coronary artery disease status post drug-eluting stent to the LAD in January 2012. 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  He is here today for routine followup. He has occasional sharp shooting chest pain that he takes nitroglycerin for it eases it immediately. He says it's usually when he's overexerting himself like raking leaves. He becomes short of breath but also has asthma. He denies any radiation of pain, diaphoresis, dizziness, nausea or presyncope. He says it occurs maybe 3 or 4 times a year. He ran out of his nitroglycerin needs to get it refilled.  The patient's main complaint is a significant reaction from his chemotherapy. He says he's got bone in his job protruding through his thumbs and can no longer wear his dentures. He has been to multiple doctors and is waiting to see an oral Careers adviser.   Allergies -- Pamidronate -- Other (See Comments)   --  Skin toxicity  -- Zoledronic Acid    --  ELEVATED BP, DIAPHORETIC & DIZZINESS  Current Outpatient Prescriptions on File Prior to Visit: albuterol (VENTOLIN HFA) 108 (90 BASE) MCG/ACT inhaler, USE ONE PUFF EVERY 4 HOURS WITH AEROCHAMBER., Disp: 18 g, Rfl: 6 ALPRAZolam (XANAX) 0.5 MG tablet, TAKE (1) TABLET BY MOUTH FOUR TIMES DAILY PRN., Disp: 120 tablet, Rfl: 2 aspirin 81 MG tablet, Take 81 mg by mouth daily., Disp: , Rfl:  calcipotriene (DOVONOX) 0.005 % cream, Apply topically 2 (two) times daily.  , Disp: , Rfl:  calcipotriene-betamethasone (TACLONEX) ointment, Apply 1 application topically daily., Disp: 60 g, Rfl: 3 Calcium Carbonate-Vitamin D (CALTRATE 600+D PO), Take 1 tablet by mouth daily. , Disp: , Rfl:  CAPEX 0.01 % SHAM, Apply 1 application topically as needed. , Disp: , Rfl:  chlorhexidine (PERIDEX) 0.12 % solution, Rinse with 15 mLs 3 times a day for 30 seconds. Use after breakfast, lunch, and at  bedtime. Spit out excess. Do not swallow., Disp: 1440 mL, Rfl: prn Clobetasol Propionate 0.05 % shampoo, USE SHAMPOO AS NEEDED, Disp: 118 mL, Rfl: 0 CLOBEX SPRAY 0.05 % external spray, Apply 5 sprays topically daily. , Disp: , Rfl:  diazepam (VALIUM) 10 MG tablet, Take 10 mg by mouth as needed. , Disp: , Rfl:  Elastic Bandages & Supports (LUMBAR BACK BRACE/SUPPORT PAD) MISC, 1 each by Does not apply route as directed., Disp: 1 each, Rfl: 1 gabapentin (NEURONTIN) 300 MG capsule, TAKE 3 CAPSULES BY MOUTH AT BEDTIME., Disp: 90 capsule, Rfl: 3 leuprolide, 6 Month, (ELIGARD) 45 MG injection, Inject 45 mg into the skin., Disp: , Rfl:  meclizine (ANTIVERT) 25 MG tablet, Take 25 mg by mouth 3 (three) times daily as needed., Disp: , Rfl:  metoprolol succinate (TOPROL-XL) 25 MG 24 hr tablet, take 1 tablet by mouth once daily, Disp: 30 tablet, Rfl: 5 nitroGLYCERIN (NITROSTAT) 0.4 MG SL tablet, Place 1 tablet (0.4 mg total) under the tongue every 5 (five) minutes as needed for chest pain., Disp: 25 tablet, Rfl: 6 OxyCODONE (OXYCONTIN) 20 mg T12A 12 hr tablet, Take 1 tablet (20 mg total) by mouth every 8 (eight) hours., Disp: 90 tablet, Rfl: 0 oxyCODONE-acetaminophen (PERCOCET) 10-325 MG per tablet, Take 1 tablet by mouth every 6 (six) hours as needed for pain., Disp: 100 tablet, Rfl: 0 pantoprazole (PROTONIX) 40 MG tablet, take 1 tablet by mouth once daily, Disp: 60 tablet, Rfl: 2 pravastatin (PRAVACHOL) 40 MG tablet, TAKE 2 TABLETS BY  MOUTH AT BEDTIME., Disp: 60 tablet, Rfl: 6  No current facility-administered medications on file prior to visit.   Past Medical History:   Coronary artery disease                                        Comment:DES to LAD 1/12   COPD (chronic obstructive pulmonary disease)                 Sigmoid diverticulosis                                       DDD (degenerative disc disease)                              Anxiety                                                       Depression                                                   Psoriasis                                                    Tubular adenoma                                                Comment:Colonoscopy 1/12   Horseshoe kidney                                               Comment:Single kidney   Prostate cancer                                                Comment:Metastatic, stage IV  - Dr. 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 Margo Aye in [place], [place].   APPENDECTOMY                                                  HEART STENTS                                     06/2010         Comment:2  Review of patient's family history indicates:   Alzheimer's disease            Mother                   Lung cancer                    Father                   Heart attack                   Brother                  Social History   Marital Status: Married  Spouse Name:                      Years of Education:                 Number of children:             Occupational History Occupation          Games developer, small j*                     Disable,                                           electricican   Social History Main Topics   Smoking Status: Former Smoker                   Packs/Day: 1.00  Years: 15        Types: Cigarettes     Quit date: 08/25/2007   Smokeless Status: Never Used                       Alcohol Use: No                Comment: Quit in 2007. Previous 12 pack of beer                per week.   Drug Use: No             Sexual Activity: Not on file        Other Topics            Concern   None on file  Social History Narrative   None on file    ROS: See history of present illness. Significant problems from his chemotherapy. His having trouble eating without his dentures and has pain and drainage from his upper jaw.   PHYSICAL EXAM: Well-nournished, in no acute distress. Neck: No JVD, HJR, Bruit, or thyroid enlargement  Lungs: No tachypnea, clear without wheezing, rales, or rhonchi  Cardiovascular: RRR, PMI not displaced, heart sounds normal, no murmurs, gallops, bruit, thrill, or heave.  Abdomen: BS normal. Soft without organomegaly, masses, lesions or tenderness.  Extremities: without cyanosis, clubbing or edema. Good distal pulses bilateral  SKin: Warm, no lesions or rashes   Musculoskeletal: No deformities  Neuro: no focal signs  BP 126/85  Pulse 56  Ht 5\' 9"  (1.753 m)  Wt 153 lb (69.4 kg)  BMI 22.58 kg/m2    EKG: Sinus bradycardia with poor R wave progression, T-wave inversion in aVR and V1, no acute change from prior tracings  2-D echo 10/23/12:  - Left ventricle: The cavity size was normal. There was mild   focal basal hypertrophy of the septum. Systolic function   was normal. Wall motion was normal; there were no regional   wall motion abnormalities. - Aortic valve: Mildly calcified annulus. Trileaflet; normal   thickness leaflets. - Atrial septum: No defect or patent foramen ovale was   identified. - Pulmonary arteries: PA peak pressure: 32mm Hg (S). Impressions:  - Compared  to the prior study of 06/28/10, there has been no   significant interval change.  He underwent diagnostic  cath on June 28, 2010, which showed scattered coronary anatomy, abnormalities involving RCA with 40% proximal narrowing as well as moderately high-grade proximal mid LAD stenosis that did not appear to be critical, but likely flow limiting FINAL ASSESSMENT:  Successful intravascular ultrasound guided percutaneous coronary intervention of the proximal and mid left anterior descending reducing 70% to 0% stenosis with overlapping drug-eluting stents.  There was TIMI-3 flow, both pre and post.

## 2013-05-20 NOTE — Assessment & Plan Note (Signed)
This is a significant problem for him and he is awaiting an appointment with oral surgery

## 2013-05-22 ENCOUNTER — Telehealth: Payer: Self-pay | Admitting: Family Medicine

## 2013-05-22 MED ORDER — PANTOPRAZOLE SODIUM 40 MG PO TBEC: DELAYED_RELEASE_TABLET | ORAL | Status: DC

## 2013-05-22 NOTE — Telephone Encounter (Signed)
Left message on voicemail notifying patient medication was corrected and sent in to pharmacy.

## 2013-05-22 NOTE — Telephone Encounter (Signed)
Patient says that his acid reflux meds were called into pharmacy wrong. He is supposed to take 2 a day instead of 1.   [facility]

## 2013-06-01 ENCOUNTER — Telehealth (HOSPITAL_COMMUNITY): Payer: Self-pay | Admitting: Oncology

## 2013-06-01 ENCOUNTER — Other Ambulatory Visit (HOSPITAL_COMMUNITY): Payer: Self-pay | Admitting: Oncology

## 2013-06-01 DIAGNOSIS — C61 Malignant neoplasm of prostate: Secondary | ICD-10-CM

## 2013-06-01 MED ORDER — OXYCODONE HCL ER 20 MG PO T12A: 20.0000 mg | EXTENDED_RELEASE_TABLET | Freq: Three times a day (TID) | ORAL | Status: DC

## 2013-06-01 MED ORDER — OXYCODONE-ACETAMINOPHEN 10-325 MG PO TABS: 1.0000 | ORAL_TABLET | Freq: Four times a day (QID) | ORAL | Status: DC | PRN

## 2013-06-01 NOTE — Telephone Encounter (Signed)
Nonie Hoyer called requesting refills for:   OxyCODONE 20 mg T12a 12 hr tablet  Commonly known as:  OXYCONTIN  Take 1 tablet (20 mg total) by mouth every 8 (eight) hours.     oxyCODONE-acetaminophen 10-325 MG per tablet  Commonly known as:  PERCOCET  Take 1 tablet by mouth every 6 (six) hours as needed for pain.

## 2013-06-06 ENCOUNTER — Other Ambulatory Visit: Payer: Self-pay | Admitting: Family Medicine

## 2013-06-08 ENCOUNTER — Telehealth (HOSPITAL_COMMUNITY): Payer: Self-pay | Admitting: *Deleted

## 2013-06-08 NOTE — Telephone Encounter (Signed)
Call received from insurance that approval granted for increase in quantity of oxycontin. Pharmacy and patient have been notified. And letter of approval to follow.

## 2013-06-11 ENCOUNTER — Encounter (HOSPITAL_BASED_OUTPATIENT_CLINIC_OR_DEPARTMENT_OTHER): Payer: Medicare Other

## 2013-06-11 ENCOUNTER — Encounter (HOSPITAL_COMMUNITY): Payer: Self-pay

## 2013-06-11 ENCOUNTER — Encounter (HOSPITAL_COMMUNITY): Payer: Medicare Other | Attending: Oncology

## 2013-06-11 VITALS — BP 125/66 | HR 55 | Temp 97.7°F | Resp 18 | Wt 152.6 lb

## 2013-06-11 DIAGNOSIS — Z5111 Encounter for antineoplastic chemotherapy: Secondary | ICD-10-CM

## 2013-06-11 DIAGNOSIS — C779 Secondary and unspecified malignant neoplasm of lymph node, unspecified: Secondary | ICD-10-CM

## 2013-06-11 DIAGNOSIS — C7952 Secondary malignant neoplasm of bone marrow: Secondary | ICD-10-CM

## 2013-06-11 DIAGNOSIS — C61 Malignant neoplasm of prostate: Secondary | ICD-10-CM

## 2013-06-11 DIAGNOSIS — E782 Mixed hyperlipidemia: Secondary | ICD-10-CM

## 2013-06-11 DIAGNOSIS — C7951 Secondary malignant neoplasm of bone: Secondary | ICD-10-CM

## 2013-06-11 DIAGNOSIS — I2510 Atherosclerotic heart disease of native coronary artery without angina pectoris: Secondary | ICD-10-CM

## 2013-06-11 DIAGNOSIS — M8708 Idiopathic aseptic necrosis of bone, other site: Secondary | ICD-10-CM

## 2013-06-11 DIAGNOSIS — M8710 Osteonecrosis due to drugs, unspecified bone: Secondary | ICD-10-CM

## 2013-06-11 LAB — CBC WITH DIFFERENTIAL/PLATELET
Basophils Absolute: 0.1 10*3/uL (ref 0.0–0.1)
Basophils Relative: 1 % (ref 0–1)
Eosinophils Absolute: 0.2 10*3/uL (ref 0.0–0.7)
Eosinophils Relative: 3 % (ref 0–5)
HCT: 35.4 % — ABNORMAL LOW (ref 39.0–52.0)
Hemoglobin: 11.8 g/dL — ABNORMAL LOW (ref 13.0–17.0)
Lymphocytes Relative: 38 % (ref 12–46)
Lymphs Abs: 2.9 10*3/uL (ref 0.7–4.0)
MCH: 31.7 pg (ref 26.0–34.0)
MCHC: 33.3 g/dL (ref 30.0–36.0)
MCV: 95.2 fL (ref 78.0–100.0)
Monocytes Absolute: 0.6 10*3/uL (ref 0.1–1.0)
Monocytes Relative: 8 % (ref 3–12)
Neutro Abs: 3.8 10*3/uL (ref 1.7–7.7)
Neutrophils Relative %: 50 % (ref 43–77)
Platelets: 368 10*3/uL (ref 150–400)
RBC: 3.72 MIL/uL — ABNORMAL LOW (ref 4.22–5.81)
RDW: 13.4 % (ref 11.5–15.5)
WBC: 7.6 10*3/uL (ref 4.0–10.5)

## 2013-06-11 LAB — COMPREHENSIVE METABOLIC PANEL
ALT: 10 U/L (ref 0–53)
AST: 25 U/L (ref 0–37)
Albumin: 3.8 g/dL (ref 3.5–5.2)
Alkaline Phosphatase: 78 U/L (ref 39–117)
BUN: 12 mg/dL (ref 6–23)
CO2: 28 mEq/L (ref 19–32)
Calcium: 8.9 mg/dL (ref 8.4–10.5)
Chloride: 97 mEq/L (ref 96–112)
Creatinine, Ser: 0.99 mg/dL (ref 0.50–1.35)
GFR calc Af Amer: >90 mL/min (ref >90)
GFR calc non Af Amer: 88 mL/min — ABNORMAL LOW (ref >90)
Glucose, Bld: 89 mg/dL (ref 70–99)
Potassium: 4.3 mEq/L (ref 3.7–5.3)
Sodium: 137 mEq/L (ref 137–147)
Total Bilirubin: 0.3 mg/dL (ref 0.3–1.2)
Total Protein: 7.4 g/dL (ref 6.0–8.3)

## 2013-06-11 LAB — PSA: PSA: <0.01 ng/mL — ABNORMAL LOW (ref <=4.00)

## 2013-06-11 MED ORDER — OXYCODONE HCL ER 20 MG PO T12A: 20.0000 mg | EXTENDED_RELEASE_TABLET | Freq: Three times a day (TID) | ORAL | Status: DC

## 2013-06-11 MED ORDER — LEUPROLIDE ACETATE (6 MONTH) 45 MG ~~LOC~~ KIT
45.0000 mg | PACK | Freq: Once | SUBCUTANEOUS | Status: AC
  Administered 2013-06-11: 45 mg via SUBCUTANEOUS
  Filled 2013-06-11: qty 45

## 2013-06-11 MED ORDER — OXYCODONE-ACETAMINOPHEN 10-325 MG PO TABS: 1.0000 | ORAL_TABLET | Freq: Four times a day (QID) | ORAL | Status: DC | PRN

## 2013-06-11 MED ORDER — LEUPROLIDE ACETATE (4 MONTH) 30 MG IM KIT: 45.0000 mg | PACK | Freq: Once | INTRAMUSCULAR | Status: DC

## 2013-06-11 NOTE — Patient Instructions (Signed)
[facility] Discharge Instructions  RECOMMENDATIONS MADE BY THE CONSULTANT AND ANY TEST RESULTS WILL BE SENT TO YOUR REFERRING PHYSICIAN.  EXAM FINDINGS BY THE PHYSICIAN TODAY AND SIGNS OR SYMPTOMS TO REPORT TO CLINIC OR PRIMARY PHYSICIAN: Exam and findings as discussed by Dr. Barnet Glasgow.  If there are any problems with your blood work we will call you.  Will make a referral back to [name].  Report uncontrolled pain or other problems.  MEDICATIONS PRESCRIBED:  Refills for Oxycontin and Percocet - Take as directed.  INSTRUCTIONS/FOLLOW-UP: Follow-up in 3 months with MD visit and 6 months for Eligard injection.  Thank you for choosing [facility] to provide your oncology and hematology care.  To afford each patient quality time with our providers, please arrive at least 15 minutes before your scheduled appointment time.  With your help, our goal is to use those 15 minutes to complete the necessary work-up to ensure our physicians have the information they need to help with your evaluation and healthcare recommendations.    Effective January 1st, 2014, we ask that you re-schedule your appointment with our physicians should you arrive 10 or more minutes late for your appointment.  We strive to give you quality time with our providers, and arriving late affects you and other patients whose appointments are after yours.    Again, thank you for choosing [facility].  Our hope is that these requests will decrease the amount of time that you wait before being seen by our physicians.       _____________________________________________________________  Should you have questions after your visit to [facility], please contact our office at [phone] between the hours of 8:30 a.m. and 5:00 p.m.  Voicemails left after 4:30 p.m. will not be returned until the following business day.  For prescription refill requests, have your pharmacy  contact our office with your prescription refill request.

## 2013-06-11 NOTE — Progress Notes (Signed)
Labs drawn today for cbc/diff,cmp,psa,testosterone 

## 2013-06-11 NOTE — Progress Notes (Signed)
[facility]  OFFICE PROGRESS NOTE  [name], MD [place] [address]  DIAGNOSIS: Prostate cancer - Plan: Comprehensive metabolic panel, Testosterone, CBC with Differential, Comprehensive metabolic panel, PSA, Testosterone, CBC with Differential, PSA, Testosterone, OxyCODONE (OXYCONTIN) 20 mg T12A 12 hr tablet, oxyCODONE-acetaminophen (PERCOCET) 10-325 MG per tablet, CANCELED: Comprehensive metabolic panel, DISCONTINUED: leuprolide (LUPRON) injection 45 mg  Bone metastases - Plan: Comprehensive metabolic panel, Testosterone, CBC with Differential, Comprehensive metabolic panel, PSA, Testosterone, CBC with Differential, Comprehensive metabolic panel, PSA, Testosterone, CANCELED: Comprehensive metabolic panel, DISCONTINUED: leuprolide (LUPRON) injection 45 mg  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 Prostate Cancer    Lymph node metastases, androgen sensitive    CURRENT THERAPY: Depo Lupron intramuscularly every 6 months (45 mg ).  INTERVAL HISTORY: [name] [name] 58 y.o. 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  As you recall his original PSA was 17. He continues to have severe pain in the left upper jaw associated with osteonecrosis due to bisphosphonate therapy. He does not have any other bone pain. Appetite is fair because of many foods that irritated his mouth and making him unable to chew. He denies any fever, night sweats, breast pain, diarrhea, constipation, dysuria, hematuria, incontinence, lower extremity swelling or redness, chest pain, PND,  orthopnea, palpitations, skin rash, headache, or seizures.  MEDICAL HISTORY: Past Medical History  Diagnosis Date  . Coronary artery disease     DES to LAD 1/12  . COPD (chronic obstructive pulmonary disease)   . Sigmoid diverticulosis   . DDD (degenerative disc disease)   . Anxiety   . Depression   . Psoriasis   . Tubular adenoma     Colonoscopy 1/12  . Horseshoe kidney     Single kidney  . Prostate cancer     Metastatic, stage IV  - [name]  . Internal hemorrhoids     Colonoscopy 1/12  . Drug reaction     Pamidronate - induced skin toxicity, Zometa - induced toxicity  . GERD (gastroesophageal reflux disease)   . Gastritis   . Prostate ca 12/29/2010  . Osteoarthritis   . 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 in [facility]. He was then of lymph node involvement but bone scan was negative.  ALLERGIES:  is allergic to pamidronate and zoledronic acid.  MEDICATIONS: has a current medication list which includes the following prescription(s): albuterol, alprazolam, aspirin, calcipotriene, calcipotriene-betamethasone, calcium carbonate-vitamin d, capex, chlorhexidine, clobetasol propionate, clobex spray, lumbar back brace/support pad, gabapentin, leuprolide (6 month), meclizine, metoprolol succinate, nitroglycerin, oxycodone, oxycodone-acetaminophen, pantoprazole, pravastatin, and taclonex.  SURGICAL HISTORY:  Past Surgical History  Procedure Laterality Date  . Anterior fusion cervical spine    . Posterior fusion cervical spine    . Left foot surgery    . Prostatectomy  02/28/2006    [name] in  [place], [place].  Marland Kitchen Appendectomy    . Heart  stents  06/2010    2    FAMILY HISTORY: family history includes Alzheimer's disease in his mother; Heart attack in his brother; Lung cancer in his father.  SOCIAL HISTORY:  reports that he quit smoking about 5 years ago. His smoking use included Cigarettes. He has a 15 pack-year smoking history. He has never used smokeless tobacco. He reports that he does not drink alcohol or use illicit drugs.  REVIEW OF SYSTEMS:  Other than that discussed above is noncontributory.  PHYSICAL EXAMINATION: ECOG PERFORMANCE STATUS: 1 - Symptomatic but completely ambulatory  Blood pressure 125/66, pulse 55, temperature 97.7 F (36.5 C), temperature source Oral, resp. rate 18, weight 152 lb 9.6 oz (69.219 kg).  GENERAL:alert, no distress and comfortable SKIN: skin color, texture, turgor are normal, no rashes or significant lesions EYES: PERLA; Conjunctiva are pink and non-injected, sclera clear OROPHARYNX:no exudate, no erythema on lips, buccal mucosa, or tongue. Left upper jaw with dry socket. Poor oral hygiene. NECK: supple, thyroid normal size, non-tender, without nodularity. No masses CHEST: Increased AP diameter with bilateral gynecomastia that is nontender. LYMPH:  no palpable lymphadenopathy in the cervical, axillary or inguinal LUNGS: clear to auscultation and percussion with normal breathing effort HEART: regular rate & rhythm and no murmurs. ABDOMEN:abdomen soft, non-tender and normal bowel sounds MUSCULOSKELETAL:no cyanosis of digits and no clubbing. Range of motion normal.  NEURO: alert & oriented x 3 with fluent speech, no focal motor/sensory deficits   LABORATORY DATA: Infusion on 06/11/2013  Component Date Value Range Status  . WBC 06/11/2013 7.6  4.0 - 10.5 K/uL Final  . RBC 06/11/2013 3.72* 4.22 - 5.81 MIL/uL Final  . Hemoglobin 06/11/2013 11.8* 13.0 - 17.0 g/dL Final  . HCT 06/11/2013 35.4* 39.0 - 52.0 % Final  . MCV 06/11/2013 95.2  78.0 - 100.0 fL Final  . MCH 06/11/2013 31.7   26.0 - 34.0 pg Final  . MCHC 06/11/2013 33.3  30.0 - 36.0 g/dL Final  . RDW 06/11/2013 13.4  11.5 - 15.5 % Final  . Platelets 06/11/2013 368  150 - 400 K/uL Final  . Neutrophils Relative % 06/11/2013 50  43 - 77 % Final  . Neutro Abs 06/11/2013 3.8  1.7 - 7.7 K/uL Final  . Lymphocytes Relative 06/11/2013 38  12 - 46 % Final  . Lymphs Abs 06/11/2013 2.9  0.7 - 4.0 K/uL Final  . Monocytes Relative 06/11/2013 8  3 - 12 % Final  . Monocytes Absolute 06/11/2013 0.6  0.1 - 1.0 K/uL Final  . Eosinophils Relative 06/11/2013 3  0 - 5 % Final  . Eosinophils Absolute 06/11/2013 0.2  0.0 - 0.7 K/uL Final  . Basophils Relative 06/11/2013 1  0 - 1 % Final  . Basophils Absolute 06/11/2013 0.1  0.0 - 0.1 K/uL Final  . Sodium 06/11/2013 137  137 - 147 mEq/L Final  . Potassium 06/11/2013 4.3  3.7 - 5.3 mEq/L Final  . Chloride 06/11/2013 97  96 - 112 mEq/L Final  . CO2 06/11/2013 28  19 - 32 mEq/L Final  . Glucose, Bld 06/11/2013 89  70 - 99 mg/dL Final  . BUN 06/11/2013 12  6 - 23 mg/dL Final  . Creatinine, Ser 06/11/2013 0.99  0.50 - 1.35 mg/dL Final  . Calcium 06/11/2013 8.9  8.4 - 10.5 mg/dL Final  . Total Protein 06/11/2013 7.4  6.0 - 8.3 g/dL Final  . Albumin 06/11/2013 3.8  3.5 -  5.2 g/dL Final  . AST 06/11/2013 25  0 - 37 U/L Final  . ALT 06/11/2013 10  0 - 53 U/L Final  . Alkaline Phosphatase 06/11/2013 78  39 - 117 U/L Final  . Total Bilirubin 06/11/2013 0.3  0.3 - 1.2 mg/dL Final  . GFR calc non Af Amer 06/11/2013 88* >90 mL/min Final  . GFR calc Af Amer 06/11/2013 >90  >90 mL/min Final   Comment: (NOTE)                          The eGFR has been calculated using the CKD EPI equation.                          This calculation has not been validated in all clinical situations.                          eGFR's persistently <90 mL/min signify possible Chronic Kidney                          Disease.    PATHOLOGY: No new pathology.  Urinalysis No results found for this basename:  colorurine,  appearanceur,  labspec,  phurine,  glucoseu,  hgbur,  bilirubinur,  ketonesur,  proteinur,  urobilinogen,  nitrite,  leukocytesur    RADIOGRAPHIC STUDIES: No results found.  ASSESSMENT:  #1. Stage IV prostate cancer with lymph node involvement, androgen sensitive, for additional [facility] agonist therapy today utilizing depot Lupron 45 mg intramuscularly, status post radical prostatectomy with surgery performed in September of 2007 #2. Osteonecrosis of the jaw, currently taking OxyContin, Percocet, and gabapentin with referral back to oral surgery. #3. Coronary artery disease, status post stenting x2 the last in January 2012. #4. Status post anterior and posterior fusion of the cervical spine.   PLAN:  #1. Depot Lupron 45 mg intramuscularly today. #2. Referral to [name]. #3. Followup in 3 months with CBC, chem profile, PSA, and testosterone level.   All questions were answered. The patient knows to call the clinic with any problems, questions or concerns. We can certainly see the patient much sooner if necessary.   I spent 25 minutes counseling the patient face to face. The total time spent in the appointment was 30 minutes.    Doroteo Bradford, MD 06/11/2013 2:01 PM

## 2013-06-11 NOTE — Progress Notes (Signed)
Gareth Eagle presents today for injection per MD orders. Eligard 45 mg administered SQ in left Abdomen. Administration without incident. Patient tolerated well.

## 2013-06-12 LAB — TESTOSTERONE: Testosterone: <10 ng/dL — ABNORMAL LOW (ref 300–890)

## 2013-06-24 ENCOUNTER — Other Ambulatory Visit (HOSPITAL_COMMUNITY): Payer: Self-pay | Admitting: Oncology

## 2013-07-09 ENCOUNTER — Other Ambulatory Visit (HOSPITAL_COMMUNITY): Payer: Self-pay | Admitting: Oncology

## 2013-07-09 ENCOUNTER — Telehealth (HOSPITAL_COMMUNITY): Payer: Self-pay | Admitting: *Deleted

## 2013-07-09 ENCOUNTER — Telehealth (HOSPITAL_COMMUNITY): Payer: Self-pay

## 2013-07-09 DIAGNOSIS — C61 Malignant neoplasm of prostate: Secondary | ICD-10-CM

## 2013-07-09 MED ORDER — OXYCODONE-ACETAMINOPHEN 10-325 MG PO TABS: 1.0000 | ORAL_TABLET | Freq: Four times a day (QID) | ORAL | Status: DC | PRN

## 2013-07-09 MED ORDER — OXYCODONE HCL ER 20 MG PO T12A: 20.0000 mg | EXTENDED_RELEASE_TABLET | Freq: Three times a day (TID) | ORAL | Status: DC

## 2013-07-09 NOTE — Telephone Encounter (Signed)
Call back from patient and notified that prescriptions were ready for pick-up.

## 2013-07-09 NOTE — Telephone Encounter (Signed)
Message copied by Mellissa Kohut on Thu Jul 09, 2013  4:14 PM ------      Message from: [facility]      Created: Thu Jul 09, 2013  3:59 PM       Rx is ready and in drawer            need rx for oxycontin and percocet ------

## 2013-07-13 ENCOUNTER — Ambulatory Visit (INDEPENDENT_AMBULATORY_CARE_PROVIDER_SITE_OTHER): Payer: Medicare Other | Admitting: Family Medicine

## 2013-07-13 ENCOUNTER — Encounter: Payer: Self-pay | Admitting: Family Medicine

## 2013-07-13 VITALS — BP 136/90 | Ht 69.0 in | Wt 149.0 lb

## 2013-07-13 DIAGNOSIS — T50904A Poisoning by unspecified drugs, medicaments and biological substances, undetermined, initial encounter: Secondary | ICD-10-CM | POA: Diagnosis not present

## 2013-07-13 DIAGNOSIS — M8700 Idiopathic aseptic necrosis of unspecified bone: Secondary | ICD-10-CM | POA: Diagnosis not present

## 2013-07-13 DIAGNOSIS — J329 Chronic sinusitis, unspecified: Secondary | ICD-10-CM | POA: Diagnosis not present

## 2013-07-13 DIAGNOSIS — I2510 Atherosclerotic heart disease of native coronary artery without angina pectoris: Secondary | ICD-10-CM | POA: Diagnosis not present

## 2013-07-13 DIAGNOSIS — J310 Chronic rhinitis: Secondary | ICD-10-CM

## 2013-07-13 DIAGNOSIS — E785 Hyperlipidemia, unspecified: Secondary | ICD-10-CM | POA: Diagnosis not present

## 2013-07-13 DIAGNOSIS — M8710 Osteonecrosis due to drugs, unspecified bone: Secondary | ICD-10-CM

## 2013-07-13 DIAGNOSIS — E782 Mixed hyperlipidemia: Secondary | ICD-10-CM

## 2013-07-13 DIAGNOSIS — K219 Gastro-esophageal reflux disease without esophagitis: Secondary | ICD-10-CM

## 2013-07-13 MED ORDER — LEVOFLOXACIN 500 MG PO TABS: 500.0000 mg | ORAL_TABLET | Freq: Every day | ORAL | Status: AC

## 2013-07-13 NOTE — Progress Notes (Signed)
Subjective:    Patient ID: [name] [name], male    DOB: 1955-11-27, 58 y.o.   MRN: [id number]  HPI Patient arrives for a follow up on cholesterol and to discuss recent blood work results. Patient reports he is still having problems with swishing and ringing in ears.   No chest pain no shortness of breath no nausea no diaphoresis no exertional discomfort. Recently saw a cardiologist.  Positive headache frontal in nature nasal discharge. Worse over the past week intermittent cough with that.  Trying to watch fat in his diet. Compliant with his cholesterol medication. No recent blood work in this regard.  Still having challenge with apparent osteonecrosis of the jaw. Specialist saw patient and wished not to work on them. At this time. Results for orders placed in visit on 06/11/13  CBC WITH DIFFERENTIAL      Result Value Range   WBC 7.6  4.0 - 10.5 K/uL   RBC 3.72 (*) 4.22 - 5.81 MIL/uL   Hemoglobin 11.8 (*) 13.0 - 17.0 g/dL   HCT 35.4 (*) 39.0 - 52.0 %   MCV 95.2  78.0 - 100.0 fL   MCH 31.7  26.0 - 34.0 pg   MCHC 33.3  30.0 - 36.0 g/dL   RDW 13.4  11.5 - 15.5 %   Platelets 368  150 - 400 K/uL   Neutrophils Relative % 50  43 - 77 %   Neutro Abs 3.8  1.7 - 7.7 K/uL   Lymphocytes Relative 38  12 - 46 %   Lymphs Abs 2.9  0.7 - 4.0 K/uL   Monocytes Relative 8  3 - 12 %   Monocytes Absolute 0.6  0.1 - 1.0 K/uL   Eosinophils Relative 3  0 - 5 %   Eosinophils Absolute 0.2  0.0 - 0.7 K/uL   Basophils Relative 1  0 - 1 %   Basophils Absolute 0.1  0.0 - 0.1 K/uL  COMPREHENSIVE METABOLIC PANEL      Result Value Range   Sodium 137  137 - 147 mEq/L   Potassium 4.3  3.7 - 5.3 mEq/L   Chloride 97  96 - 112 mEq/L   CO2 28  19 - 32 mEq/L   Glucose, Bld 89  70 - 99 mg/dL   BUN 12  6 - 23 mg/dL   Creatinine, Ser 0.99  0.50 - 1.35 mg/dL   Calcium 8.9  8.4 - 10.5 mg/dL   Total Protein 7.4  6.0 - 8.3 g/dL   Albumin 3.8  3.5 - 5.2 g/dL   AST 25  0 - 37 U/L   ALT 10  0 - 53 U/L   Alkaline  Phosphatase 78  39 - 117 U/L   Total Bilirubin 0.3  0.3 - 1.2 mg/dL   GFR calc non Af Amer 88 (*) >90 mL/min   GFR calc Af Amer >90  >90 mL/min  PSA      Result Value Range   PSA <0.01 (*) <=4.00 ng/mL  TESTOSTERONE      Result Value Range   Testosterone <10 (*) 300 - 890 ng/dL    Review of Systems No chest pain no back pain no abdominal pain no rash no change in bowel habits no blood in stool ROS otherwise negative.    Objective:   Physical Exam  Alert mild malaise. HEENT moderate nasal congestion. Frontal tenderness. Pharynx normal neck supple. Lungs clear. No wheezes. Heart regular in rhythm. Ankles without edema.  Assessment & Plan:  Impression 1 coronary artery disease clinically silent #2 chronic prostate cancer. #3 hyperlipidemia status uncertain. #4 rhinosinusitis plan antibiotics prescribed. Symptomatic care discussed. Levaquin daily 10 days. Diet exercise discussed in encourage. Check every 6 months. WSL

## 2013-07-20 ENCOUNTER — Telehealth (HOSPITAL_COMMUNITY): Payer: Self-pay | Admitting: *Deleted

## 2013-07-20 NOTE — Telephone Encounter (Signed)
Appointment made for 07-29-13 @ 12:45 with Dr. Diona Browner in [place] pt notified

## 2013-07-29 ENCOUNTER — Telehealth: Payer: Self-pay | Admitting: Family Medicine

## 2013-07-29 NOTE — Telephone Encounter (Signed)
Please see paper chart. Pt's insurance is requiring Quantity Limit override prior auth for his BID dose of Pravastatin 40mg .  It's because it requires 60 tablets for 30 days.  If we write for 80mg  at once a day dose it shouldn't need anything special.  Can we change it?  Please advise.

## 2013-07-31 ENCOUNTER — Other Ambulatory Visit: Payer: Self-pay | Admitting: Nurse Practitioner

## 2013-07-31 MED ORDER — PRAVASTATIN SODIUM 80 MG PO TABS: 80.0000 mg | ORAL_TABLET | Freq: Every day | ORAL | Status: DC

## 2013-07-31 NOTE — Telephone Encounter (Signed)
Yes. I will change in chart

## 2013-07-31 NOTE — Telephone Encounter (Signed)
Yes, the plan will cover 80mg  Pravastatin at once daily dose

## 2013-08-03 ENCOUNTER — Other Ambulatory Visit: Payer: Self-pay | Admitting: *Deleted

## 2013-08-04 ENCOUNTER — Ambulatory Visit (INDEPENDENT_AMBULATORY_CARE_PROVIDER_SITE_OTHER): Payer: Medicare Other | Admitting: Family Medicine

## 2013-08-04 ENCOUNTER — Encounter: Payer: Self-pay | Admitting: Family Medicine

## 2013-08-04 VITALS — BP 130/82 | Temp 97.8°F | Ht 69.0 in | Wt 156.1 lb

## 2013-08-04 DIAGNOSIS — M8710 Osteonecrosis due to drugs, unspecified bone: Secondary | ICD-10-CM

## 2013-08-04 DIAGNOSIS — J029 Acute pharyngitis, unspecified: Secondary | ICD-10-CM

## 2013-08-04 MED ORDER — CLINDAMYCIN HCL 150 MG PO CAPS: 150.0000 mg | ORAL_CAPSULE | Freq: Three times a day (TID) | ORAL | Status: DC

## 2013-08-04 NOTE — Progress Notes (Signed)
   Subjective:    Patient ID: [name] [name], male    DOB: 18-Jan-1956, 58 y.o.   MRN: [id number]  Sore Throat  This is a new problem. The current episode started yesterday. The problem has been unchanged. Neither side of throat is experiencing more pain than the other. There has been no fever. He has tried gargles for the symptoms. The treatment provided no relief.   Due to see oral surgeon soon for osteonecrosis of the jaw.  Blistering lesions on the throat. Next  Painful at times. Next  Took all the Levaquin up.  At times notes an odor using chlorhexidine wash per specialist recommendation    Review of Systems No headache no chest pain no back pain no abdominal pain ROS otherwise negative    Objective:   Physical Exam  Alert no apparent distress. Lungs clear. Heart regular rate and rhythm. H&T pharynx blistering splotches along soft palate. Erythema. Osteonecrosis patch and jaw still evident      Assessment & Plan:  Impression pharyngitis with blistering mild though certainly uncomfortable #2 osteonecrosis jaw plan await specialist input. Clindamycin 150 3 times a day 10 days. Takes Magic mouthwash. Add cultured yogurt. WSL

## 2013-08-04 NOTE — Patient Instructions (Signed)
Use dannon yogurt with "good Bacteria" one cup daily while on antibiotics

## 2013-08-07 ENCOUNTER — Telehealth: Payer: Self-pay | Admitting: Family Medicine

## 2013-08-07 NOTE — Telephone Encounter (Signed)
Rx prior auth obtained for pt's PANTOPRAZOLE 40mg  for quantity limit exception, expires 07/29/14 through [facility], faxed approval to [facility]

## 2013-08-17 ENCOUNTER — Other Ambulatory Visit (HOSPITAL_COMMUNITY): Payer: Self-pay | Admitting: Oncology

## 2013-08-17 ENCOUNTER — Telehealth (HOSPITAL_COMMUNITY): Payer: Self-pay | Admitting: Oncology

## 2013-08-17 DIAGNOSIS — C61 Malignant neoplasm of prostate: Secondary | ICD-10-CM

## 2013-08-17 MED ORDER — OXYCODONE HCL ER 20 MG PO T12A
20.0000 mg | EXTENDED_RELEASE_TABLET | Freq: Three times a day (TID) | ORAL | Status: DC
Start: 2013-08-17 — End: 2013-09-03

## 2013-08-17 MED ORDER — OXYCODONE-ACETAMINOPHEN 10-325 MG PO TABS: 1.0000 | ORAL_TABLET | Freq: Four times a day (QID) | ORAL | Status: DC | PRN

## 2013-08-20 ENCOUNTER — Other Ambulatory Visit (HOSPITAL_COMMUNITY): Payer: Self-pay | Admitting: Oncology

## 2013-08-21 ENCOUNTER — Telehealth: Payer: Self-pay | Admitting: Family Medicine

## 2013-08-21 NOTE — Telephone Encounter (Signed)
Received letter from [facility], pt's quantity limit exception is approved for his PRAVASTATIN 40mg , expires 08/17/13

## 2013-09-02 ENCOUNTER — Telehealth (HOSPITAL_COMMUNITY): Payer: Self-pay | Admitting: Oncology

## 2013-09-02 NOTE — Telephone Encounter (Signed)
Rcvd a call for [place] She stated that the pt has been approved to see an oon oral sx. Dr Bayard Beaver. 08/25/13-06/03/14 6 visits  Labs and xrays included. [id number]

## 2013-09-03 ENCOUNTER — Encounter (HOSPITAL_COMMUNITY): Payer: Medicare Other | Attending: Oncology

## 2013-09-03 ENCOUNTER — Encounter (HOSPITAL_COMMUNITY): Payer: Self-pay

## 2013-09-03 VITALS — BP 129/89 | HR 68 | Temp 98.1°F | Resp 18 | Wt 151.7 lb

## 2013-09-03 DIAGNOSIS — D491 Neoplasm of unspecified behavior of respiratory system: Secondary | ICD-10-CM | POA: Insufficient documentation

## 2013-09-03 DIAGNOSIS — M87180 Osteonecrosis due to drugs, jaw: Secondary | ICD-10-CM

## 2013-09-03 DIAGNOSIS — C61 Malignant neoplasm of prostate: Secondary | ICD-10-CM | POA: Insufficient documentation

## 2013-09-03 DIAGNOSIS — R6884 Jaw pain: Secondary | ICD-10-CM

## 2013-09-03 DIAGNOSIS — C7952 Secondary malignant neoplasm of bone marrow: Secondary | ICD-10-CM

## 2013-09-03 DIAGNOSIS — C7951 Secondary malignant neoplasm of bone: Secondary | ICD-10-CM

## 2013-09-03 DIAGNOSIS — M8708 Idiopathic aseptic necrosis of bone, other site: Secondary | ICD-10-CM

## 2013-09-03 LAB — COMPREHENSIVE METABOLIC PANEL
ALT: 12 U/L (ref 0–53)
AST: 21 U/L (ref 0–37)
Albumin: 3.8 g/dL (ref 3.5–5.2)
Alkaline Phosphatase: 55 U/L (ref 39–117)
BUN: 8 mg/dL (ref 6–23)
CO2: 30 mEq/L (ref 19–32)
Calcium: 8.9 mg/dL (ref 8.4–10.5)
Chloride: 101 mEq/L (ref 96–112)
Creatinine, Ser: 0.96 mg/dL (ref 0.50–1.35)
GFR calc Af Amer: >90 mL/min (ref >90)
GFR calc non Af Amer: 89 mL/min — ABNORMAL LOW (ref >90)
Glucose, Bld: 102 mg/dL — ABNORMAL HIGH (ref 70–99)
Potassium: 3.6 mEq/L — ABNORMAL LOW (ref 3.7–5.3)
Sodium: 141 mEq/L (ref 137–147)
Total Bilirubin: 0.2 mg/dL — ABNORMAL LOW (ref 0.3–1.2)
Total Protein: 7.3 g/dL (ref 6.0–8.3)

## 2013-09-03 LAB — CBC WITH DIFFERENTIAL/PLATELET
Basophils Absolute: 0 10*3/uL (ref 0.0–0.1)
Basophils Relative: 0 % (ref 0–1)
Eosinophils Absolute: 0.3 10*3/uL (ref 0.0–0.7)
Eosinophils Relative: 3 % (ref 0–5)
HCT: 35.7 % — ABNORMAL LOW (ref 39.0–52.0)
Hemoglobin: 11.7 g/dL — ABNORMAL LOW (ref 13.0–17.0)
Lymphocytes Relative: 32 % (ref 12–46)
Lymphs Abs: 2.4 10*3/uL (ref 0.7–4.0)
MCH: 31 pg (ref 26.0–34.0)
MCHC: 32.8 g/dL (ref 30.0–36.0)
MCV: 94.7 fL (ref 78.0–100.0)
Monocytes Absolute: 0.7 10*3/uL (ref 0.1–1.0)
Monocytes Relative: 9 % (ref 3–12)
Neutro Abs: 4.1 10*3/uL (ref 1.7–7.7)
Neutrophils Relative %: 55 % (ref 43–77)
Platelets: 294 10*3/uL (ref 150–400)
RBC: 3.77 MIL/uL — ABNORMAL LOW (ref 4.22–5.81)
RDW: 13.7 % (ref 11.5–15.5)
WBC: 7.5 10*3/uL (ref 4.0–10.5)

## 2013-09-03 LAB — TESTOSTERONE: Testosterone: 24 ng/dL — ABNORMAL LOW (ref 300–890)

## 2013-09-03 MED ORDER — OXYCODONE HCL 10 MG PO TABS: ORAL_TABLET | ORAL | Status: DC

## 2013-09-03 MED ORDER — OXYCODONE HCL ER 60 MG PO T12A: 60.0000 mg | EXTENDED_RELEASE_TABLET | Freq: Three times a day (TID) | ORAL | Status: DC

## 2013-09-03 NOTE — Patient Instructions (Signed)
[facility] Discharge Instructions  RECOMMENDATIONS MADE BY THE CONSULTANT AND ANY TEST RESULTS WILL BE SENT TO YOUR REFERRING PHYSICIAN. Stop the Elequil, which is not due until July. We will schedule you a MRI of your jaw and call with the time.  We will see you 2 weeks after that scan to review results.   Thank you for choosing [facility] to provide your oncology and hematology care.  To afford each patient quality time with our providers, please arrive at least 15 minutes before your scheduled appointment time.  With your help, our goal is to use those 15 minutes to complete the necessary work-up to ensure our physicians have the information they need to help with your evaluation and healthcare recommendations.    Effective January 1st, 2014, we ask that you re-schedule your appointment with our physicians should you arrive 10 or more minutes late for your appointment.  We strive to give you quality time with our providers, and arriving late affects you and other patients whose appointments are after yours.    Again, thank you for choosing [facility].  Our hope is that these requests will decrease the amount of time that you wait before being seen by our physicians.       _____________________________________________________________  Should you have questions after your visit to [facility], please contact our office at [phone] between the hours of 8:30 a.m. and 5:00 p.m.  Voicemails left after 4:30 p.m. will not be returned until the following business day.  For prescription refill requests, have your pharmacy contact our office with your prescription refill request.

## 2013-09-03 NOTE — Progress Notes (Signed)
Labs drawn from pt's right ac. W/ 23 gauge butterfly, pt tolerated well.

## 2013-09-03 NOTE — Progress Notes (Signed)
[facility]  OFFICE PROGRESS NOTE  [name], MD [address] [place] [address]  DIAGNOSIS: Prostate cancer - Plan: CBC with Differential, Comprehensive metabolic panel, PSA, Testosterone, CANCELED: Beta 2 microglobuline, serum  Bone metastases  Chief Complaint  Patient presents with  . Prostate Cancer    CURRENT THERAPY: Eligard 45 mg intramuscularly every 6 months since 2007, last treatment on 06/11/2013 at which time PSA was less than 0.01 and testosterone level is less than 10.  INTERVAL HISTORY: [name] [name] 58 y.o. male returns for followup a receiving [facility] agonist therapy every 6 months for partial the prostate with lymph node involvement. Her treated in the past with bisphosphonates as well as Xgeva and develop jaw pain diagnosed as osteonecrosis of the jaw. Jaw pain is worsened to the point where he has a difficult time chewing food. This is primarily in the upper left jaw. He denies a sore throat, fever, but does have night sweats without breast pain, diarrhea, constipation, urinary hesitancy, lower 70 swelling or redness, skin rash, headache, or seizures. Pain level is 7-8/10.  MEDICAL HISTORY: Past Medical History  Diagnosis Date  . Coronary artery disease     DES to LAD 1/12  . COPD (chronic obstructive pulmonary disease)   . Sigmoid diverticulosis   . DDD (degenerative disc disease)   . Anxiety   . Depression   . Psoriasis   . Tubular adenoma     Colonoscopy 1/12  . Horseshoe kidney     Single kidney  . Prostate cancer     Metastatic, stage IV  - [name]  . Internal hemorrhoids     Colonoscopy 1/12  . Drug reaction     Pamidronate - induced skin toxicity, Zometa - induced toxicity  . GERD (gastroesophageal reflux disease)   . Gastritis   . Prostate ca 12/29/2010  . Osteoarthritis   . 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 in [facility]. Demonstrated lymph node involvement but bone scan was negative. Receiving Eligard 45 mg intramuscularly every 6 months.  ALLERGIES:  is allergic to pamidronate and zoledronic acid.  MEDICATIONS: has a current medication list which includes the following prescription(s): albuterol, alprazolam, aspirin, calcipotriene, calcipotriene-betamethasone, calcium carbonate-vitamin d, capex, chlorhexidine, clindamycin, clobetasol propionate, clobex spray, lumbar back brace/support pad, gabapentin, leuprolide (6 month), meclizine, metoprolol succinate, nitroglycerin, oxycodone, oxycodone-acetaminophen, pantoprazole, pravastatin, and taclonex.  SURGICAL HISTORY:  Past Surgical History  Procedure Laterality Date  . Anterior fusion cervical spine    . Posterior fusion cervical spine    . Left foot surgery    . Prostatectomy  02/28/2006    [name] in [place], [place].  Marland Kitchen Appendectomy    . Heart stents  06/2010    2    FAMILY HISTORY: family history includes Alzheimer's disease in his mother; Heart attack in his brother; Lung cancer in his father.  SOCIAL HISTORY:  reports that he quit smoking about 6 years ago. His smoking use included Cigarettes. He has a 15 pack-year smoking history. He has never used smokeless tobacco. He reports that he does not drink alcohol or use illicit drugs.  REVIEW  OF SYSTEMS:  Other than that discussed above is noncontributory.  PHYSICAL EXAMINATION: ECOG PERFORMANCE STATUS: 1 - Symptomatic but completely ambulatory  There were no vitals taken for this visit.  GENERAL: An anxious and tearful. SKIN: skin color, texture, turgor are normal, no rashes or significant lesions EYES: PERLA;  Conjunctiva are pink and non-injected, sclera clear SINUSES: No redness or tenderness over maxillary or ethmoid sinuses OROPHARYNX:no exudate, no erythema on lips, buccal mucosa, or tongue. Left upper jaw with area of necrosis and foul-smelling discharge without bleeding. NECK: supple, thyroid normal size, non-tender, without nodularity. No masses CHEST: Increased AP diameter with bilateral gynecomastia. LYMPH:  no palpable lymphadenopathy in the cervical, axillary or inguinal LUNGS: clear to auscultation and percussion with normal breathing effort HEART: regular rate & rhythm and no murmurs. ABDOMEN:abdomen soft, non-tender and normal bowel sounds MUSCULOSKELETAL:no cyanosis of digits and no clubbing. Range of motion normal.  NEURO: alert & oriented x 3 with fluent speech, no focal motor/sensory deficits   LABORATORY DATA: No visits with results within 30 Day(s) from this visit. Latest known visit with results is:  Infusion on 06/11/2013  Component Date Value Ref Range Status  . WBC 06/11/2013 7.6  4.0 - 10.5 K/uL Final  . RBC 06/11/2013 3.72* 4.22 - 5.81 MIL/uL Final  . Hemoglobin 06/11/2013 11.8* 13.0 - 17.0 g/dL Final  . HCT 06/11/2013 35.4* 39.0 - 52.0 % Final  . MCV 06/11/2013 95.2  78.0 - 100.0 fL Final  . MCH 06/11/2013 31.7  26.0 - 34.0 pg Final  . MCHC 06/11/2013 33.3  30.0 - 36.0 g/dL Final  . RDW 06/11/2013 13.4  11.5 - 15.5 % Final  . Platelets 06/11/2013 368  150 - 400 K/uL Final  . Neutrophils Relative % 06/11/2013 50  43 - 77 % Final  . Neutro Abs 06/11/2013 3.8  1.7 - 7.7 K/uL Final  . Lymphocytes Relative 06/11/2013 38  12 - 46 % Final  . Lymphs Abs 06/11/2013 2.9  0.7 - 4.0 K/uL Final  . Monocytes Relative 06/11/2013 8  3 - 12 % Final  . Monocytes Absolute 06/11/2013 0.6  0.1 - 1.0 K/uL Final  . Eosinophils Relative 06/11/2013 3  0 - 5 % Final  . Eosinophils Absolute 06/11/2013 0.2  0.0 - 0.7 K/uL Final  . Basophils Relative 06/11/2013 1  0 - 1 % Final  .  Basophils Absolute 06/11/2013 0.1  0.0 - 0.1 K/uL Final  . Sodium 06/11/2013 137  137 - 147 mEq/L Final  . Potassium 06/11/2013 4.3  3.7 - 5.3 mEq/L Final  . Chloride 06/11/2013 97  96 - 112 mEq/L Final  . CO2 06/11/2013 28  19 - 32 mEq/L Final  . Glucose, Bld 06/11/2013 89  70 - 99 mg/dL Final  . BUN 06/11/2013 12  6 - 23 mg/dL Final  . Creatinine, Ser 06/11/2013 0.99  0.50 - 1.35 mg/dL Final  . Calcium 06/11/2013 8.9  8.4 - 10.5 mg/dL Final  . Total Protein 06/11/2013 7.4  6.0 - 8.3 g/dL Final  . Albumin 06/11/2013 3.8  3.5 - 5.2 g/dL Final  . AST 06/11/2013 25  0 - 37 U/L Final  . ALT 06/11/2013 10  0 - 53 U/L Final  . Alkaline Phosphatase 06/11/2013 78  39 - 117 U/L Final  . Total Bilirubin 06/11/2013 0.3  0.3 - 1.2 mg/dL Final  . GFR calc non Af Amer 06/11/2013 88* >90 mL/min Final  . GFR calc Af Amer 06/11/2013 >90  >90 mL/min  Final   Comment: (NOTE)                          The eGFR has been calculated using the CKD EPI equation.                          This calculation has not been validated in all clinical situations.                          eGFR's persistently <90 mL/min signify possible Chronic Kidney                          Disease.  Marland Kitchen PSA 06/11/2013 <0.01* <=4.00 ng/mL Final   Comment: (NOTE)                          Result repeated and verified.                          Test Methodology: ECLIA PSA (Electrochemiluminescence Immunoassay)                          For PSA values from 2.5-4.0, particularly in younger men <60 years                          old, the AUA and NCCN suggest testing for % Free PSA (3515) and                          evaluation of the rate of increase in PSA (PSA velocity).                          Performed at [facility]  . Testosterone 06/11/2013 <10* 300 - 890 ng/dL Final   Comment: (NOTE)                                   Tanner Stage       Male              Male                                       I              < 30 ng/dL         < 10 ng/dL                                       II             < 150 ng/dL       < 30 ng/dL                                       III            100-320 ng/dL     <  35 ng/dL                                       IV             200-970 ng/dL     15-40 ng/dL                                       V/Adult        300-890 ng/dL     10-70 ng/dL                          Performed at [facility]: No new pathology.  Urinalysis No results found for this basename: colorurine, appearanceur, labspec, phurine, glucoseu, hgbur, bilirubinur, ketonesur, proteinur, urobilinogen, nitrite, leukocytesur    RADIOGRAPHIC STUDIES: No results found.  ASSESSMENT:  #1. Stage IV carcinoma prostate with evidence of lymph node involvement, on long-term LHRH agonist therapy since 2008. #2. Osteonecrosis of the left upper jaw. Symptomatic. #3. Coronary artery disease, status post stenting x2 the last in January 2012, no evidence of heart failure or dysrhythmia. #4. Status post anterior and posterior fusion of the cervical spine.   PLAN:  #1. OxyContin 60 mg every 12 hours. #2. Oxycodone 10-20 mg every 4 hours to control breakthrough pain. #3. Discontinue Eligard for now. #4. MRI of the mandible and upper jaw. #5. Followup in 2 weeks.   All questions were answered. The patient knows to call the clinic with any problems, questions or concerns. We can certainly see the patient much sooner if necessary.   I spent 30 minutes counseling the patient face to face. The total time spent in the appointment was 40 minutes.    Doroteo Bradford, MD 09/03/2013 11:41 AM

## 2013-09-04 LAB — PSA: PSA: <0.01 ng/mL — ABNORMAL LOW (ref <=4.00)

## 2013-09-16 ENCOUNTER — Ambulatory Visit (HOSPITAL_COMMUNITY)
Admission: RE | Admit: 2013-09-16 | Discharge: 2013-09-16 | Disposition: A | Discharge status: Home / Self Care | Payer: Medicare Other | Source: Ambulatory Visit | Attending: Hematology and Oncology | Admitting: Hematology and Oncology

## 2013-09-16 DIAGNOSIS — C61 Malignant neoplasm of prostate: Secondary | ICD-10-CM | POA: Diagnosis not present

## 2013-09-16 DIAGNOSIS — R220 Localized swelling, mass and lump, head: Secondary | ICD-10-CM | POA: Diagnosis not present

## 2013-09-16 DIAGNOSIS — R221 Localized swelling, mass and lump, neck: Secondary | ICD-10-CM

## 2013-09-16 DIAGNOSIS — R6884 Jaw pain: Secondary | ICD-10-CM

## 2013-09-16 DIAGNOSIS — G9389 Other specified disorders of brain: Secondary | ICD-10-CM | POA: Diagnosis not present

## 2013-09-16 DIAGNOSIS — R609 Edema, unspecified: Secondary | ICD-10-CM | POA: Diagnosis not present

## 2013-09-16 MED ORDER — GADOBENATE DIMEGLUMINE 529 MG/ML IV SOLN
14.0000 mL | Freq: Once | INTRAVENOUS | Status: AC | PRN
  Administered 2013-09-16: 14 mL via INTRAVENOUS

## 2013-09-17 ENCOUNTER — Ambulatory Visit (INDEPENDENT_AMBULATORY_CARE_PROVIDER_SITE_OTHER): Payer: Medicare Other | Admitting: Otolaryngology

## 2013-09-17 ENCOUNTER — Other Ambulatory Visit (HOSPITAL_COMMUNITY): Payer: Self-pay | Admitting: Hematology and Oncology

## 2013-09-17 DIAGNOSIS — D386 Neoplasm of uncertain behavior of respiratory organ, unspecified: Secondary | ICD-10-CM | POA: Diagnosis not present

## 2013-09-17 DIAGNOSIS — M8710 Osteonecrosis due to drugs, unspecified bone: Secondary | ICD-10-CM

## 2013-09-18 ENCOUNTER — Telehealth: Payer: Self-pay | Admitting: Family Medicine

## 2013-09-18 DIAGNOSIS — C61 Malignant neoplasm of prostate: Secondary | ICD-10-CM

## 2013-09-18 NOTE — Addendum Note (Signed)
Addended by: Dairl Ponder on: 09/18/2013 03:12 PM   Modules accepted: Orders

## 2013-09-18 NOTE — Telephone Encounter (Signed)
Let's do ref to take over ca care, may send copy results

## 2013-09-18 NOTE — Telephone Encounter (Signed)
Order for referral placed in Epic. 

## 2013-09-18 NOTE — Telephone Encounter (Signed)
Patient wants a referral to [facility] hospital to see [name]. Also he wants his test results sent to him.

## 2013-09-22 ENCOUNTER — Encounter (HOSPITAL_COMMUNITY): Payer: Self-pay

## 2013-09-22 ENCOUNTER — Encounter (HOSPITAL_BASED_OUTPATIENT_CLINIC_OR_DEPARTMENT_OTHER): Payer: Medicare Other

## 2013-09-22 VITALS — BP 118/78 | HR 65 | Temp 97.7°F | Resp 18 | Wt 150.6 lb

## 2013-09-22 DIAGNOSIS — D491 Neoplasm of unspecified behavior of respiratory system: Secondary | ICD-10-CM

## 2013-09-22 DIAGNOSIS — Z8546 Personal history of malignant neoplasm of prostate: Secondary | ICD-10-CM

## 2013-09-22 DIAGNOSIS — R6884 Jaw pain: Secondary | ICD-10-CM

## 2013-09-22 DIAGNOSIS — C61 Malignant neoplasm of prostate: Secondary | ICD-10-CM

## 2013-09-22 NOTE — Patient Instructions (Signed)
[facility] Discharge Instructions  RECOMMENDATIONS MADE BY THE CONSULTANT AND ANY TEST RESULTS WILL BE SENT TO YOUR REFERRING PHYSICIAN.  EXAM FINDINGS BY THE PHYSICIAN TODAY AND SIGNS OR SYMPTOMS TO REPORT TO CLINIC OR PRIMARY PHYSICIAN: Exam and findings as discussed by Dr. Barnet Glasgow.  Need to increase your nutrition, can get boost, ensure, etc.  Report uncontrolled pain or other issues.  Continue follow-up with [name].  MEDICATIONS PRESCRIBED:  none  INSTRUCTIONS/FOLLOW-UP: Follow-up in 6 weeks with labs and office visit.  Thank you for choosing [facility] to provide your oncology and hematology care.  To afford each patient quality time with our providers, please arrive at least 15 minutes before your scheduled appointment time.  With your help, our goal is to use those 15 minutes to complete the necessary work-up to ensure our physicians have the information they need to help with your evaluation and healthcare recommendations.    Effective January 1st, 2014, we ask that you re-schedule your appointment with our physicians should you arrive 10 or more minutes late for your appointment.  We strive to give you quality time with our providers, and arriving late affects you and other patients whose appointments are after yours.    Again, thank you for choosing [facility].  Our hope is that these requests will decrease the amount of time that you wait before being seen by our physicians.       _____________________________________________________________  Should you have questions after your visit to [facility], please contact our office at [phone] between the hours of 8:30 a.m. and 5:00 p.m.  Voicemails left after 4:30 p.m. will not be returned until the following business day.  For prescription refill requests, have your pharmacy contact our office with your prescription refill request.

## 2013-09-22 NOTE — Addendum Note (Signed)
Addended by: Mellissa Kohut on: 09/22/2013 10:47 AM   Modules accepted: Orders

## 2013-09-22 NOTE — Progress Notes (Signed)
[place]  OFFICE PROGRESS NOTE  [name], MD [address] [place] [address]  DIAGNOSIS: No diagnosis found.  Chief Complaint  Patient presents with  . Jaw tumor    CURRENT THERAPY: Eligard 45 mg IM every 6 months since 2007, last treatment 06/11/2013 at which time PSA was less than 0.01 and testosterone level was less than 10. Because of increasing swelling and pain involving the left jaw, MRI was performed.  INTERVAL HISTORY: [name] [name] 58 y.o. male returns for followup after MRI of the jaw was performed presumably osteonecrosis of the jaw due to previous therapy with Xgeva and bisphosphonates.  Since his last visit he was evaluated by [name] and he is scheduled for a biopsy of the sinus on 09/28/2013. Pain is much improved on OxyContin 60 mg every 12 hours with 30-40 mg of oxycodone for breakthrough pain. He finds difficulty in chewing his food and is taking soft diet along with supplements. He denies any constipation and pain level now is 2/10. He denies any fever, night sweats, hot flashes, lower extremity swelling or redness, blurred or double vision, skin rash, or seizures.  MEDICAL HISTORY: Past Medical History  Diagnosis Date  . Coronary artery disease     DES to LAD 1/12  . COPD (chronic obstructive pulmonary disease)   . Sigmoid diverticulosis   . DDD (degenerative disc disease)   . Anxiety   . Depression   . Psoriasis   . Tubular adenoma     Colonoscopy 1/12  . Horseshoe kidney     Single kidney  . Prostate cancer     Metastatic, stage IV  - [name]  . Internal hemorrhoids     Colonoscopy 1/12  . Drug reaction     Pamidronate - induced skin toxicity, Zometa - induced toxicity  . GERD (gastroesophageal reflux disease)   . Gastritis   . Prostate ca 12/29/2010  . Osteoarthritis   . 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    ALLERGIES:  is allergic to pamidronate and zoledronic acid.  MEDICATIONS: has a current medication list which includes the following prescription(s): albuterol, alprazolam, aspirin, calcipotriene, calcipotriene-betamethasone, calcium carbonate-vitamin d, capex, chlorhexidine, clindamycin, clobetasol propionate, lumbar back brace/support pad, gabapentin, meclizine, metoprolol succinate, nitroglycerin, oxycodone hcl, oxycodone hcl er, pantoprazole, pravastatin, taclonex, clobex spray, and oxycodone-acetaminophen.  SURGICAL HISTORY:  Past Surgical History  Procedure Laterality Date  . Anterior fusion cervical spine    . Posterior fusion cervical spine    . Left foot surgery    . Prostatectomy  02/28/2006    [name] in [place], [place].  Marland Kitchen Appendectomy    . Heart stents  06/2010    2    FAMILY HISTORY: family history includes Alzheimer's disease in his mother; Heart attack in his brother; Lung cancer in his father.  SOCIAL HISTORY:  reports that he quit smoking about 6 years ago. His smoking use included Cigarettes. He has a 15 pack-year smoking history. He has never used smokeless tobacco. He reports that he does not drink alcohol or use illicit drugs.  REVIEW OF SYSTEMS:  Other than that discussed above is noncontributory.  PHYSICAL EXAMINATION: ECOG PERFORMANCE STATUS: 1 - Symptomatic but completely ambulatory  Weight 150 lb  9.6 oz (68.312 kg).  GENERAL:alert, no distress and comfortable SKIN: skin color, texture, turgor are normal, no rashes or significant lesions EYES: PERLA; Conjunctiva are pink and non-injected, sclera clear SINUSES: Tenderness over the left maxillary and ethmoid sinus ses OROPHARYNX:no exudate, no erythema on lips, buccal mucosa, or tongue. Dry socket noted in the left upper maxilla. NECK: supple, thyroid normal size,  non-tender, without nodularity. No masses CHEST: Normal AP diameter with bilateral gynecomastia. LYMPH:  no palpable lymphadenopathy in the cervical, axillary or inguinal LUNGS: clear to auscultation and percussion with normal breathing effort HEART: regular rate & rhythm and no murmurs. ABDOMEN:abdomen soft, non-tender and normal bowel sounds MUSCULOSKELETAL:no cyanosis of digits and no clubbing. Range of motion normal.  NEURO: alert & oriented x 3 with fluent speech, no focal motor/sensory deficits   LABORATORY DATA: Office Visit on 09/03/2013  Component Date Value Ref Range Status  . WBC 09/03/2013 7.5  4.0 - 10.5 K/uL Final  . RBC 09/03/2013 3.77* 4.22 - 5.81 MIL/uL Final  . Hemoglobin 09/03/2013 11.7* 13.0 - 17.0 g/dL Final  . HCT 09/03/2013 35.7* 39.0 - 52.0 % Final  . MCV 09/03/2013 94.7  78.0 - 100.0 fL Final  . MCH 09/03/2013 31.0  26.0 - 34.0 pg Final  . MCHC 09/03/2013 32.8  30.0 - 36.0 g/dL Final  . RDW 09/03/2013 13.7  11.5 - 15.5 % Final  . Platelets 09/03/2013 294  150 - 400 K/uL Final  . Neutrophils Relative % 09/03/2013 55  43 - 77 % Final  . Neutro Abs 09/03/2013 4.1  1.7 - 7.7 K/uL Final  . Lymphocytes Relative 09/03/2013 32  12 - 46 % Final  . Lymphs Abs 09/03/2013 2.4  0.7 - 4.0 K/uL Final  . Monocytes Relative 09/03/2013 9  3 - 12 % Final  . Monocytes Absolute 09/03/2013 0.7  0.1 - 1.0 K/uL Final  . Eosinophils Relative 09/03/2013 3  0 - 5 % Final  . Eosinophils Absolute 09/03/2013 0.3  0.0 - 0.7 K/uL Final  . Basophils Relative 09/03/2013 0  0 - 1 % Final  . Basophils Absolute 09/03/2013 0.0  0.0 - 0.1 K/uL Final  . Sodium 09/03/2013 141  137 - 147 mEq/L Final  . Potassium 09/03/2013 3.6* 3.7 - 5.3 mEq/L Final  . Chloride 09/03/2013 101  96 - 112 mEq/L Final  . CO2 09/03/2013 30  19 - 32 mEq/L Final  . Glucose, Bld 09/03/2013 102* 70 - 99 mg/dL Final  . BUN 09/03/2013 8  6 - 23 mg/dL Final  . Creatinine, Ser 09/03/2013 0.96  0.50 - 1.35 mg/dL Final  .  Calcium 09/03/2013 8.9  8.4 - 10.5 mg/dL Final  . Total Protein 09/03/2013 7.3  6.0 - 8.3 g/dL Final  . Albumin 09/03/2013 3.8  3.5 - 5.2 g/dL Final  . AST 09/03/2013 21  0 - 37 U/L Final  . ALT 09/03/2013 12  0 - 53 U/L Final  . Alkaline Phosphatase 09/03/2013 55  39 - 117 U/L Final  . Total Bilirubin 09/03/2013 0.2* 0.3 - 1.2 mg/dL Final  . GFR calc non Af Amer 09/03/2013 89* >90 mL/min Final  . GFR calc Af Amer 09/03/2013 >90  >90 mL/min Final   Comment: (NOTE)                          The eGFR has been calculated using the CKD EPI equation.  This calculation has not been validated in all clinical situations.                          eGFR's persistently <90 mL/min signify possible Chronic Kidney                          Disease.  Marland Kitchen PSA 09/03/2013 <0.01* <=4.00 ng/mL Final   Comment: (NOTE)                          Result repeated and verified.                          Test Methodology: ECLIA PSA (Electrochemiluminescence Immunoassay)                          For PSA values from 2.5-4.0, particularly in younger men <60 years                          old, the AUA and NCCN suggest testing for % Free PSA (3515) and                          evaluation of the rate of increase in PSA (PSA velocity).                          Performed at [facility]  . Testosterone 09/03/2013 24* 300 - 890 ng/dL Final   Comment: (NOTE)                                   Tanner Stage       Male              Male                                       I              < 30 ng/dL        < 10 ng/dL                                       II             < 150 ng/dL       < 30 ng/dL                                       III            100-320 ng/dL     < 35 ng/dL                                       IV             200-970 ng/dL     15-40 ng/dL  V/Adult        300-890 ng/dL     10-70 ng/dL                          Performed at [facility]: No new pathology but biopsy is scheduled of the sinus on 09/28/2013  Urinalysis No results found for this basename: colorurine, appearanceur, labspec, phurine, glucoseu, hgbur, bilirubinur, ketonesur, proteinur, urobilinogen, nitrite, leukocytesur    RADIOGRAPHIC STUDIES: Mr Face/trigeminal Wo/w Cm  09/16/2013   CLINICAL DATA:  58 year old male with possible left upper mandible osteonecrosis, in the setting of stage IV prostate cancer. Initial encounter.  EXAM: MR FACE/TRIGEMINAL WO/W CM  TECHNIQUE: Multiplanar, multisequence MR imaging was performed both before and after administration of intravenous contrast.  CONTRAST:  38m MULTIHANCE GADOBENATE DIMEGLUMINE 529 MG/ML IV SOLN  COMPARISON:  None.  FINDINGS: Abnormal signal filling the left maxillary sinus demonstrates solid soft tissue type enhancement (series 8, image 12) and is associated with bony edema of the anterior wall of the left maxillary sinus (series 4, image 12). No enlargement of the infraorbital nerve identified. The masslike process with in the left maxillary sinus tracks through the left ostiomeatal complex and into the left anterior ethmoids (series 8, image 6 and series 9, image 22). All told, the lesion encompasses 4.5 x 4.0 x 6.2 cm (AP by transverse by CC). The nasal septum appears to remain intact.  Superimposed on these changes is abnormal STIR signal and enhancement in the left masticator space, especially the lateral pterygoid musculature (series 4, image 14, series 8, image 13).  No associated abnormal marrow signal in the left mandible. There is a solitary residual left posterior mandible molar. The left TMJ appears normal except for the abnormal surrounding fibromuscular signal (series 8, image 12). Mastoids are clear.  There are retained secretions and mucosal thickening in the right maxillary and ethmoid sinuses.  No left orbital involvement by the above findings. Bilateral orbits soft tissues appear within  normal limits. Grossly negative visualized bilateral brain parenchyma. Visible internal auditory structures appear normal. Major intracranial vascular flow voids are preserved. Superficial scalp and face soft tissues are within normal limits.  IMPRESSION: 1. Soft tissue mass filling the left maxillary sinuses and left anterior ethmoid air cells. Marrow edema along the anterior wall of the left maxillary sinus might reflect infiltration of the bone. Top differential considerations in this setting include prostate metastasis to the paranasal sinus, and less likely primary paranasal sinus neoplasm (such as inverted papilloma). Lesion encompasses 4.5 x 4.0 x 6.2 cm. See series 9, image 22. 2. Evidence of involvement of the anterior wall of the left maxillary sinus. 3. Abnormal signal and enhancement involving the muscles of the left masticator space. Unclear how this relates to the lesion in #1. The mandible and left TMJ remain normal at this time.   Electronically Signed   By: LLars PinksM.D.   On: 09/16/2013 17:47    ASSESSMENT:  #1. 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 #2. Carcinoma of the prostate, no evidence of disease with normal PSA. #3. Coronary artery disease, status post stenting x2 last in January 2012, no evidence of heart failure or dysrhythmia. #4. Status post anterior and posterior fusion of the cervical spine, no neurologic deficit.   PLAN:  #1. Biopsy sinus 09/28/2013. #2. Hold Eligard for now, next treatment due in July 2015. #3. Office visit 6 weeks with CBC and  chem profile depending upon findings on sinus biopsy.   All questions were answered. The patient knows to call the clinic with any problems, questions or concerns. We can certainly see the patient much sooner if necessary.   I spent 25 minutes counseling the patient face to face. The total time spent in the appointment was 30 minutes.    Farrel Gobble, MD 09/22/2013 8:38 AM  DISCLAIMER:  This  note was dictated with voice recognition software.  Similar sounding words can inadvertently be transcribed inaccurately and may not be corrected upon review.

## 2013-09-23 ENCOUNTER — Other Ambulatory Visit: Payer: Self-pay | Admitting: Otolaryngology

## 2013-09-23 ENCOUNTER — Encounter (HOSPITAL_BASED_OUTPATIENT_CLINIC_OR_DEPARTMENT_OTHER): Payer: Self-pay | Admitting: *Deleted

## 2013-09-23 NOTE — Progress Notes (Signed)
Dr. Al Corpus reviewed [place] for surgery. Plans to come tomorrow for BMET.

## 2013-09-25 ENCOUNTER — Encounter (HOSPITAL_BASED_OUTPATIENT_CLINIC_OR_DEPARTMENT_OTHER): Payer: Self-pay | Admitting: *Deleted

## 2013-09-26 ENCOUNTER — Other Ambulatory Visit (HOSPITAL_COMMUNITY): Payer: Self-pay | Admitting: Oncology

## 2013-09-28 ENCOUNTER — Ambulatory Visit (HOSPITAL_BASED_OUTPATIENT_CLINIC_OR_DEPARTMENT_OTHER): Payer: Medicare Other | Admitting: Anesthesiology

## 2013-09-28 ENCOUNTER — Ambulatory Visit (HOSPITAL_BASED_OUTPATIENT_CLINIC_OR_DEPARTMENT_OTHER)
Admission: RE | Admit: 2013-09-28 | Discharge: 2013-09-28 | Disposition: A | Discharge status: Home / Self Care | Payer: Medicare Other | Source: Ambulatory Visit | Attending: Otolaryngology | Admitting: Otolaryngology

## 2013-09-28 ENCOUNTER — Encounter (HOSPITAL_BASED_OUTPATIENT_CLINIC_OR_DEPARTMENT_OTHER): Payer: Medicare Other | Admitting: Anesthesiology

## 2013-09-28 ENCOUNTER — Encounter (HOSPITAL_BASED_OUTPATIENT_CLINIC_OR_DEPARTMENT_OTHER)
Admission: RE | Disposition: A | Discharge status: Home / Self Care | Payer: Self-pay | Source: Ambulatory Visit | Attending: Otolaryngology

## 2013-09-28 ENCOUNTER — Encounter (HOSPITAL_BASED_OUTPATIENT_CLINIC_OR_DEPARTMENT_OTHER): Payer: Self-pay | Admitting: *Deleted

## 2013-09-28 DIAGNOSIS — F411 Generalized anxiety disorder: Secondary | ICD-10-CM | POA: Insufficient documentation

## 2013-09-28 DIAGNOSIS — I252 Old myocardial infarction: Secondary | ICD-10-CM | POA: Insufficient documentation

## 2013-09-28 DIAGNOSIS — K649 Unspecified hemorrhoids: Secondary | ICD-10-CM | POA: Insufficient documentation

## 2013-09-28 DIAGNOSIS — Z87891 Personal history of nicotine dependence: Secondary | ICD-10-CM | POA: Insufficient documentation

## 2013-09-28 DIAGNOSIS — I2510 Atherosclerotic heart disease of native coronary artery without angina pectoris: Secondary | ICD-10-CM | POA: Insufficient documentation

## 2013-09-28 DIAGNOSIS — J320 Chronic maxillary sinusitis: Secondary | ICD-10-CM | POA: Insufficient documentation

## 2013-09-28 DIAGNOSIS — Z8546 Personal history of malignant neoplasm of prostate: Secondary | ICD-10-CM | POA: Insufficient documentation

## 2013-09-28 DIAGNOSIS — K219 Gastro-esophageal reflux disease without esophagitis: Secondary | ICD-10-CM | POA: Insufficient documentation

## 2013-09-28 DIAGNOSIS — F3289 Other specified depressive episodes: Secondary | ICD-10-CM | POA: Insufficient documentation

## 2013-09-28 DIAGNOSIS — F329 Major depressive disorder, single episode, unspecified: Secondary | ICD-10-CM | POA: Insufficient documentation

## 2013-09-28 DIAGNOSIS — J329 Chronic sinusitis, unspecified: Secondary | ICD-10-CM

## 2013-09-28 DIAGNOSIS — J449 Chronic obstructive pulmonary disease, unspecified: Secondary | ICD-10-CM | POA: Insufficient documentation

## 2013-09-28 DIAGNOSIS — J4489 Other specified chronic obstructive pulmonary disease: Secondary | ICD-10-CM | POA: Insufficient documentation

## 2013-09-28 DIAGNOSIS — J342 Deviated nasal septum: Secondary | ICD-10-CM | POA: Insufficient documentation

## 2013-09-28 DIAGNOSIS — IMO0002 Reserved for concepts with insufficient information to code with codable children: Secondary | ICD-10-CM | POA: Insufficient documentation

## 2013-09-28 DIAGNOSIS — L408 Other psoriasis: Secondary | ICD-10-CM | POA: Insufficient documentation

## 2013-09-28 DIAGNOSIS — K5730 Diverticulosis of large intestine without perforation or abscess without bleeding: Secondary | ICD-10-CM | POA: Insufficient documentation

## 2013-09-28 DIAGNOSIS — Z9221 Personal history of antineoplastic chemotherapy: Secondary | ICD-10-CM | POA: Insufficient documentation

## 2013-09-28 DIAGNOSIS — M129 Arthropathy, unspecified: Secondary | ICD-10-CM | POA: Insufficient documentation

## 2013-09-28 DIAGNOSIS — J322 Chronic ethmoidal sinusitis: Secondary | ICD-10-CM | POA: Insufficient documentation

## 2013-09-28 HISTORY — PX: MAXILLARY ANTROSTOMY: SHX2003

## 2013-09-28 HISTORY — DX: Unspecified asthma, uncomplicated: J45.909

## 2013-09-28 HISTORY — PX: ETHMOIDECTOMY: SHX5197

## 2013-09-28 HISTORY — DX: Acute myocardial infarction, unspecified: I21.9

## 2013-09-28 LAB — POCT HEMOGLOBIN-HEMACUE: Hemoglobin: 11.9 g/dL — ABNORMAL LOW (ref 13.0–17.0)

## 2013-09-28 SURGERY — ETHMOIDECTOMY
Anesthesia: General | Laterality: Left

## 2013-09-28 MED ORDER — LIDOCAINE HCL (CARDIAC) 20 MG/ML IV SOLN
INTRAVENOUS | Status: DC | PRN
  Administered 2013-09-28: 50 mg via INTRAVENOUS

## 2013-09-28 MED ORDER — MIDAZOLAM HCL 2 MG/2ML IJ SOLN
INTRAMUSCULAR | Status: AC
  Filled 2013-09-28: qty 2

## 2013-09-28 MED ORDER — OXYCODONE HCL 5 MG PO TABS
5.0000 mg | ORAL_TABLET | Freq: Once | ORAL | Status: AC | PRN
  Administered 2013-09-28: 5 mg via ORAL

## 2013-09-28 MED ORDER — MUPIROCIN CALCIUM 2 % EX CREA
TOPICAL_CREAM | CUTANEOUS | Status: DC | PRN
  Administered 2013-09-28: 1 via TOPICAL

## 2013-09-28 MED ORDER — COCAINE HCL 4 % EX SOLN
CUTANEOUS | Status: DC | PRN
  Administered 2013-09-28: 4 mL via NASAL

## 2013-09-28 MED ORDER — FENTANYL CITRATE 0.05 MG/ML IJ SOLN
INTRAMUSCULAR | Status: DC | PRN
  Administered 2013-09-28: 100 ug via INTRAVENOUS

## 2013-09-28 MED ORDER — CEFAZOLIN SODIUM-DEXTROSE 2-3 GM-% IV SOLR
INTRAVENOUS | Status: DC | PRN
  Administered 2013-09-28: 2 g via INTRAVENOUS

## 2013-09-28 MED ORDER — HYDROMORPHONE HCL PF 1 MG/ML IJ SOLN
INTRAMUSCULAR | Status: AC
  Filled 2013-09-28: qty 1

## 2013-09-28 MED ORDER — SUCCINYLCHOLINE CHLORIDE 20 MG/ML IJ SOLN
INTRAMUSCULAR | Status: DC | PRN
  Administered 2013-09-28: 100 mg via INTRAVENOUS

## 2013-09-28 MED ORDER — MIDAZOLAM HCL 2 MG/2ML IJ SOLN
1.0000 mg | INTRAMUSCULAR | Status: DC | PRN
  Administered 2013-09-28 (×2): 0.5 mg via INTRAVENOUS
  Administered 2013-09-28: 1 mg via INTRAVENOUS

## 2013-09-28 MED ORDER — PROPOFOL 10 MG/ML IV BOLUS
INTRAVENOUS | Status: DC | PRN
  Administered 2013-09-28: 150 mg via INTRAVENOUS

## 2013-09-28 MED ORDER — HYDROMORPHONE HCL PF 1 MG/ML IJ SOLN
0.2500 mg | INTRAMUSCULAR | Status: DC | PRN
  Administered 2013-09-28: 0.5 mg via INTRAVENOUS

## 2013-09-28 MED ORDER — DEXAMETHASONE SODIUM PHOSPHATE 4 MG/ML IJ SOLN
INTRAMUSCULAR | Status: DC | PRN
Start: 2013-09-28 — End: 2013-09-28
  Administered 2013-09-28: 10 mg via INTRAVENOUS

## 2013-09-28 MED ORDER — LACTATED RINGERS IV SOLN
INTRAVENOUS | Status: DC
  Administered 2013-09-28: 20 mL/h via INTRAVENOUS
  Administered 2013-09-28: 09:00:00 via INTRAVENOUS

## 2013-09-28 MED ORDER — OXYMETAZOLINE HCL 0.05 % NA SOLN
NASAL | Status: DC | PRN
  Administered 2013-09-28: 1 via NASAL

## 2013-09-28 MED ORDER — FENTANYL CITRATE 0.05 MG/ML IJ SOLN
INTRAMUSCULAR | Status: AC
  Filled 2013-09-28: qty 4

## 2013-09-28 MED ORDER — AMOXICILLIN 875 MG PO TABS: 875.0000 mg | ORAL_TABLET | Freq: Two times a day (BID) | ORAL | Status: AC

## 2013-09-28 MED ORDER — PROMETHAZINE HCL 25 MG/ML IJ SOLN: 6.2500 mg | INTRAMUSCULAR | Status: DC | PRN

## 2013-09-28 MED ORDER — OXYCODONE HCL 5 MG PO TABS
ORAL_TABLET | ORAL | Status: AC
  Filled 2013-09-28: qty 1

## 2013-09-28 MED ORDER — MIDAZOLAM HCL 5 MG/5ML IJ SOLN
INTRAMUSCULAR | Status: DC | PRN
  Administered 2013-09-28: 2 mg via INTRAVENOUS

## 2013-09-28 MED ORDER — OXYCODONE HCL 5 MG/5ML PO SOLN: 5.0000 mg | Freq: Once | ORAL | Status: AC | PRN

## 2013-09-28 MED ORDER — COCAINE HCL 4 % EX SOLN
CUTANEOUS | Status: AC
  Filled 2013-09-28: qty 4

## 2013-09-28 MED ORDER — FENTANYL CITRATE 0.05 MG/ML IJ SOLN: 50.0000 ug | INTRAMUSCULAR | Status: DC | PRN

## 2013-09-28 SURGICAL SUPPLY — 61 items
ATTRACTOMAT 16X20 MAGNETIC DRP (DRAPES) IMPLANT
BLADE RAD40 ROTATE 4M 4 5PK (BLADE) IMPLANT
BLADE RAD40 ROTATE 4M 4MM 5PK (BLADE)
BLADE RAD60 ROTATE M4 4 5PK (BLADE) IMPLANT
BLADE RAD60 ROTATE M4 4MM 5PK (BLADE)
BLADE ROTATE RAD12 5PK M4 4MM (BLADE) IMPLANT
BLADE TRICUT ROTATE M4 4 5PK (BLADE) IMPLANT
BLADE TRICUT ROTATE M4 4MM 5PK (BLADE)
BUR HS RAD FRONTAL 3 (BURR) IMPLANT
BUR HS RAD FRONTAL 3MM (BURR)
CANISTER SUC SOCK COL 7IN (MISCELLANEOUS) ×2 IMPLANT
CANISTER SUCT 1200ML W/VALVE (MISCELLANEOUS) ×3 IMPLANT
CANISTER SUCTION 1200CC (MISCELLANEOUS) ×2 IMPLANT
CATH SINUS BALLN 7X16 (CATHETERS) IMPLANT
CATH SINUS BALLN RELIEV 6X16 (SINUPLASTY) IMPLANT
CATH SINUS GUIDE F-70 (CATHETERS) IMPLANT
CATH SINUS GUIDE M/110 (CATHETERS) IMPLANT
CATH SINUS IRRIGATION 2.0 (CATHETERS) IMPLANT
COAGULATOR SUCT 6 FR SWTCH (ELECTROSURGICAL)
COAGULATOR SUCT 8FR VV (MISCELLANEOUS) IMPLANT
COAGULATOR SUCT SWTCH 10FR 6 (ELECTROSURGICAL) IMPLANT
DECANTER SPIKE VIAL GLASS SM (MISCELLANEOUS) IMPLANT
DEVICE INFLATION 20/61 (MISCELLANEOUS) IMPLANT
DRSG NASAL KENNEDY LMNT 8CM (GAUZE/BANDAGES/DRESSINGS) IMPLANT
DRSG NASOPORE 8CM (GAUZE/BANDAGES/DRESSINGS) ×4 IMPLANT
DRSG TELFA 3X8 NADH (GAUZE/BANDAGES/DRESSINGS) IMPLANT
ELECT REM PT RETURN 9FT ADLT (ELECTROSURGICAL)
ELECTRODE REM PT RTRN 9FT ADLT (ELECTROSURGICAL) IMPLANT
GLOVE BIO SURGEON STRL SZ 6.5 (GLOVE) ×1 IMPLANT
GLOVE BIO SURGEON STRL SZ7.5 (GLOVE) ×3 IMPLANT
GLOVE BIO SURGEONS STRL SZ 6.5 (GLOVE) ×1
GLOVE BIOGEL PI IND STRL 7.0 (GLOVE) IMPLANT
GLOVE BIOGEL PI INDICATOR 7.0 (GLOVE) ×2
GOWN STRL REUS W/ TWL LRG LVL3 (GOWN DISPOSABLE) ×1 IMPLANT
GOWN STRL REUS W/TWL LRG LVL3 (GOWN DISPOSABLE) ×6
HANDLE SINUS GUIDE LP (INSTRUMENTS) IMPLANT
HEMOSTAT SURGICEL 2X14 (HEMOSTASIS) IMPLANT
IV NS 500ML (IV SOLUTION)
IV NS 500ML BAXH (IV SOLUTION) ×1 IMPLANT
NDL SPNL 25GX3.5 QUINCKE BL (NEEDLE) IMPLANT
NEEDLE 27GAX1X1/2 (NEEDLE) ×1 IMPLANT
NEEDLE SPNL 25GX3.5 QUINCKE BL (NEEDLE) IMPLANT
NS IRRIG 1000ML POUR BTL (IV SOLUTION) ×2 IMPLANT
PACK BASIN DAY SURGERY FS (CUSTOM PROCEDURE TRAY) ×3 IMPLANT
PACK ENT DAY SURGERY (CUSTOM PROCEDURE TRAY) ×3 IMPLANT
PAD DRESSING TELFA 3X8 NADH (GAUZE/BANDAGES/DRESSINGS) IMPLANT
SET EXT MALE ROTATING LL 32IN (MISCELLANEOUS) IMPLANT
SET IV EXT TUBING FEMALE 31 (MISCELLANEOUS) IMPLANT
SLEEVE SCD COMPRESS KNEE MED (MISCELLANEOUS) IMPLANT
SOLUTION BUTLER CLEAR DIP (MISCELLANEOUS) ×4 IMPLANT
SPLINT NASAL DOYLE BI-VL (GAUZE/BANDAGES/DRESSINGS) IMPLANT
SPONGE GAUZE 2X2 8PLY STER LF (GAUZE/BANDAGES/DRESSINGS) ×2
SPONGE GAUZE 2X2 8PLY STRL LF (GAUZE/BANDAGES/DRESSINGS) ×3 IMPLANT
SPONGE NEURO XRAY DETECT 1X3 (DISPOSABLE) ×3 IMPLANT
SUT ETHILON 3 0 PS 1 (SUTURE) IMPLANT
SUT PLAIN 4 0 ~~LOC~~ 1 (SUTURE) IMPLANT
SYSTEM RELIEVA LUMA ILLUM (SINUPLASTY) IMPLANT
TOWEL OR 17X24 6PK STRL BLUE (TOWEL DISPOSABLE) ×3 IMPLANT
TUBE CONNECTING 20'X1/4 (TUBING) ×1
TUBE CONNECTING 20X1/4 (TUBING) ×2 IMPLANT
YANKAUER SUCT BULB TIP NO VENT (SUCTIONS) ×2 IMPLANT

## 2013-09-28 NOTE — Discharge Instructions (Addendum)

## 2013-09-28 NOTE — Progress Notes (Signed)
Dr. Tobias Alexander at bedside .  No eKG ordered .  Patient states he has anxiety and panic attacks.  [place] following medication and no longer c/o chest discomfort and SOB

## 2013-09-28 NOTE — Progress Notes (Signed)
Dr. Tobias Alexander  notifed and at bedside for patients complaint of difficutly breathing and chest discomfort.  VSS orders received.

## 2013-09-28 NOTE — Anesthesia Preprocedure Evaluation (Addendum)
Anesthesia Evaluation  Patient identified by MRN, date of birth, ID band Patient awake    Reviewed: Allergy & Precautions, H&P , NPO status , Patient's Chart, lab work & pertinent test results, reviewed documented beta blocker date and time   Airway Mallampati: II TM Distance: >3 FB Neck ROM: Full    Dental  (+) Dental Advisory Given, Edentulous Upper, Missing, Poor Dentition   Pulmonary asthma , COPD COPD inhaler, former smoker,    Pulmonary exam normal       Cardiovascular hypertension, Pt. on medications and Pt. on home beta blockers + CAD, + Past MI and + Cardiac Stents     Neuro/Psych Anxiety Depression    GI/Hepatic Neg liver ROS, GERD-  Medicated,  Endo/Other  negative endocrine ROS  Renal/GU negative Renal ROS     Musculoskeletal   Abdominal   Peds  Hematology   Anesthesia Other Findings   Reproductive/Obstetrics                          Anesthesia Physical Anesthesia Plan  ASA: III  Anesthesia Plan: General   Post-op Pain Management:    Induction: Intravenous  Airway Management Planned: Oral ETT  Additional Equipment:   Intra-op Plan:   Post-operative Plan:   Informed Consent: I have reviewed the patients History and Physical, chart, labs and discussed the procedure including the risks, benefits and alternatives for the proposed anesthesia with the patient or authorized representative who has indicated his/her understanding and acceptance.   Dental advisory given  Plan Discussed with: CRNA, Anesthesiologist and Surgeon  Anesthesia Plan Comments:         Anesthesia Quick Evaluation

## 2013-09-28 NOTE — Transfer of Care (Signed)
Immediate Anesthesia Transfer of Care Note  Patient: [name] [name]  Procedure(s) Performed: Procedure(s): LEFT ENDOSCOPIC ANTERIOR ETHMOIDECTOMY (Left) LEFT ENDOSCOPIC MAXILLARY ANTROSTOMY WITH REMOVAL OF TISSUE (Left)  Patient Location: PACU  Anesthesia Type:General  Level of Consciousness: awake and alert   Airway & Oxygen Therapy: Patient Spontanous Breathing and Patient connected to face mask oxygen  Post-op Assessment: Report given to PACU RN and Post -op Vital signs reviewed and stable  Post vital signs: Reviewed and stable  Complications: No apparent anesthesia complications

## 2013-09-28 NOTE — Anesthesia Procedure Notes (Signed)
Procedure Name: Intubation Date/Time: 09/28/2013 10:12 AM Performed by: Lieutenant Diego Pre-anesthesia Checklist: Patient identified, Emergency Drugs available, Suction available and Patient being monitored Patient Re-evaluated:Patient Re-evaluated prior to inductionOxygen Delivery Method: Circle System Utilized Preoxygenation: Pre-oxygenation with 100% oxygen Intubation Type: IV induction Ventilation: Mask ventilation without difficulty Laryngoscope Size: Miller and 2 Tube type: Oral Tube size: 7.0 mm Number of attempts: 1 Airway Equipment and Method: stylet and oral airway Placement Confirmation: ETT inserted through vocal cords under direct vision,  positive ETCO2 and breath sounds checked- equal and bilateral Secured at: 21 cm Tube secured with: Tape Dental Injury: Teeth and Oropharynx as per pre-operative assessment

## 2013-09-28 NOTE — Anesthesia Postprocedure Evaluation (Signed)
Anesthesia Post Note  Patient: [name] [name]  Procedure(s) Performed: Procedure(s) (LRB): LEFT ENDOSCOPIC ANTERIOR ETHMOIDECTOMY (Left) LEFT ENDOSCOPIC MAXILLARY ANTROSTOMY WITH REMOVAL OF TISSUE (Left)  Anesthesia type: general  Patient location: PACU  Post pain: Pain level controlled  Post assessment: Patient's Cardiovascular Status Stable  Last Vitals:  Filed Vitals:   09/28/13 1215  BP: 155/80  Pulse: 44  Temp:   Resp: 20    Post vital signs: Reviewed and stable  Level of consciousness: sedated  Complications: No apparent anesthesia complications

## 2013-09-28 NOTE — Brief Op Note (Signed)
09/28/2013  10:58 AM  PATIENT:  [name] [name]  58 y.o. male  PRE-OPERATIVE DIAGNOSIS:  LEFT ETHMOID and MAXILLARY SINUS MASS  POST-OPERATIVE DIAGNOSIS:  LEFT ETHMOID and MAXILLARY SINUS MASS, Left chronic maxillary and ethmoid sinusitis  PROCEDURE:  Procedure(s): LEFT ENDOSCOPIC ANTERIOR ETHMOIDECTOMY (Left) LEFT ENDOSCOPIC MAXILLARY ANTROSTOMY WITH REMOVAL OF TISSUE (Left)  SURGEON:  Surgeon(s) and Role:    * Sui W Dailey Buccheri, MD - Primary  PHYSICIAN ASSISTANT:   ASSISTANTS: none   ANESTHESIA:   general  EBL:  Total I/O In: 700 [I.V.:700] Out: -   BLOOD ADMINISTERED:none  DRAINS: none   LOCAL MEDICATIONS USED:  OTHER cocaine 4%  SPECIMEN:  Source of Specimen:  Left maxillary and ethmoid sinus contents  DISPOSITION OF SPECIMEN:  PATHOLOGY  COUNTS:  YES  TOURNIQUET:  * No tourniquets in log *  DICTATION: .Other Dictation: Dictation Number [id number]  PLAN OF CARE: Discharge to home after PACU  PATIENT DISPOSITION:  PACU - hemodynamically stable.   Delay start of Pharmacological VTE agent (>24hrs) due to surgical blood loss or risk of bleeding: not applicable

## 2013-09-28 NOTE — H&P (Signed)
  H&P Update  Pt's original H&P dated 09/17/13 reviewed and placed in chart (to be scanned).  I personally examined the patient today.  No change in health. Proceed with left endoscopic maxillary antrostomy and anterior ethmoidectomy with tissue removal.

## 2013-09-29 ENCOUNTER — Other Ambulatory Visit (HOSPITAL_COMMUNITY): Payer: Self-pay | Admitting: Oncology

## 2013-09-29 ENCOUNTER — Encounter (HOSPITAL_BASED_OUTPATIENT_CLINIC_OR_DEPARTMENT_OTHER): Payer: Self-pay | Admitting: Otolaryngology

## 2013-09-29 DIAGNOSIS — F419 Anxiety disorder, unspecified: Secondary | ICD-10-CM

## 2013-09-29 DIAGNOSIS — C61 Malignant neoplasm of prostate: Secondary | ICD-10-CM

## 2013-09-29 DIAGNOSIS — M8710 Osteonecrosis due to drugs, unspecified bone: Secondary | ICD-10-CM

## 2013-09-29 MED ORDER — ALPRAZOLAM 0.5 MG PO TABS
0.5000 mg | ORAL_TABLET | Freq: Four times a day (QID) | ORAL | Status: DC | PRN
Start: 2013-09-29 — End: 2014-01-25

## 2013-09-29 NOTE — Op Note (Signed)
NAMESHIELDS, PAUTZ NO.:  [id number]  MEDICAL RECORD NO.:  [id number]  LOCATION:                                 FACILITY:  PHYSICIAN:  Leta Baptist, MD            DATE OF BIRTH:  January 06, 1956  DATE OF PROCEDURE:  09/28/2013 DATE OF DISCHARGE:                              OPERATIVE REPORT   SURGEON:  Leta Baptist, M.D.  PREOPERATIVE DIAGNOSIS:  Left maxillary and ethmoid sinus soft tissue mass.  POSTOPERATIVE DIAGNOSIS:  Left maxillary and ethmoid sinus soft tissue Mass. left ethmoid and maxillary sinusitis.  PROCEDURE PERFORMED: 1. Left endoscopic maxillary antrostomy with tissue removal. 2. Left endoscopic anterior ethmoidectomy.  ANESTHESIA:  General endotracheal tube anesthesia.  COMPLICATIONS:  None.  ESTIMATED BLOOD LOSS:  Less than 100 mL.  INDICATION FOR PROCEDURE:  The patient is a 58 year old male with a history of stage IV prostate cancer.  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  On his MRI scan, he was noted to have a large solid soft tissue mass within the left maxillary sinus, with extension into the anterior ethmoid cavities.  The findings were worrisome for possible metastasis to the paranasal sinuses or primary paranasal sinus neoplasm.  In addition, the patient also complains of a foul smelling drainage from his left nasal cavity for the past 6 months.  Based on the above findings, the decision was made for the patient to undergo the above stated procedures, with removal of the soft tissue mass for histologic identification.  The risks, benefits, alternatives, and details of the procedure were discussed with the patient.  Questions were invited and answered. Informed consent was obtained.  DESCRIPTION OF PROCEDURE:  The patient was taken to the operating room and placed supine on the operating table.  General endotracheal tube anesthesia was administered by the anesthesiologist.   The patient was positioned and prepped and draped in a standard fashion for sinonasal surgery.  Pledgets soaked with Afrin were placed in both nasal cavities for vasoconstriction.  The pledgets were subsequently removed.  Severe nasal septal deviation to the right was noted.  Endoscopic evaluation of the left nasal cavity revealed polypoid tissue within the middle meatus. The middle turbinate was carefully medialized.  The uncinate process was removed with a Soil scientist.  Polypoid tissue was noted to be emanating from the maxillary opening.  Significant bleeding was noted at this time.  The bleeding was controlled with pledgets soaked with 4% cocaine.  The soft tissue was then removed in a piecemeal fashion using a combination of Tru-Cut forceps and Blakesley forceps.  The maxillary opening was carefully enlarged using backbiter forceps and Tru-Cut forceps.  Small polypoid soft tissue was removed from the left maxillary sinus.  The sinus cavity was irrigated.  The anterior ethmoid air cells were then opened using combination of Tru-Cut forceps and suction catheters.  Polypoid tissue was also removed from the anterior ethmoid cavities.  It should also be noted that a moderate amount of purulent drainage was encountered during the procedure.  The findings were suggestive of chronic left maxillary and ethmoid sinusitis.  NasoPore packing was then used to pack the middle meatus and the rest of the left nasal cavity.  That concluded the procedure for the patient.  The care of the patient was turned over to the anesthesiologist.  The patient was awakened from anesthesia without difficulty.  He was extubated and transferred to the recovery room in good condition.  OPERATIVE FINDINGS:  Left maxillary and ethmoid soft tissue mass.  The appearance of the soft tissue mass was suggestive of inflamed polypoid tissue.  However, definite histologic identification will be performed by the  pathologist.  SPECIMEN:  Left maxillary and ethmoid soft tissue mass.  FOLLOWUP CARE:  The patient will be discharged home once he is awake and alert.  He will be placed on amoxicillin 875 mg p.o. b.i.d. for 5 days, and Percocet p.r.n. pain.  The patient will follow up in my office on Thursday for nasal debridement.     Leta Baptist, MD     ST/MEDQ  D:  09/28/2013  T:  09/29/2013  Job:  [id number]  cc:   [facility]

## 2013-10-01 ENCOUNTER — Ambulatory Visit (INDEPENDENT_AMBULATORY_CARE_PROVIDER_SITE_OTHER): Payer: Medicare Other | Admitting: Otolaryngology

## 2013-10-01 DIAGNOSIS — J320 Chronic maxillary sinusitis: Secondary | ICD-10-CM

## 2013-10-01 DIAGNOSIS — J330 Polyp of nasal cavity: Secondary | ICD-10-CM

## 2013-10-01 DIAGNOSIS — J322 Chronic ethmoidal sinusitis: Secondary | ICD-10-CM

## 2013-10-05 ENCOUNTER — Other Ambulatory Visit (HOSPITAL_COMMUNITY): Payer: Self-pay | Admitting: Oncology

## 2013-10-05 ENCOUNTER — Telehealth (HOSPITAL_COMMUNITY): Payer: Self-pay | Admitting: *Deleted

## 2013-10-05 DIAGNOSIS — C61 Malignant neoplasm of prostate: Secondary | ICD-10-CM

## 2013-10-05 MED ORDER — OXYCODONE-ACETAMINOPHEN 10-325 MG PO TABS: 1.0000 | ORAL_TABLET | Freq: Four times a day (QID) | ORAL | Status: DC | PRN

## 2013-10-05 MED ORDER — OXYCODONE HCL ER 60 MG PO T12A: 60.0000 mg | EXTENDED_RELEASE_TABLET | Freq: Three times a day (TID) | ORAL | Status: DC

## 2013-10-07 ENCOUNTER — Other Ambulatory Visit (HOSPITAL_COMMUNITY): Payer: Self-pay | Admitting: Oral Surgery

## 2013-10-07 DIAGNOSIS — M8708 Idiopathic aseptic necrosis of bone, other site: Secondary | ICD-10-CM

## 2013-10-08 ENCOUNTER — Other Ambulatory Visit (HOSPITAL_COMMUNITY): Payer: Self-pay | Admitting: Oncology

## 2013-10-08 DIAGNOSIS — M8710 Osteonecrosis due to drugs, unspecified bone: Secondary | ICD-10-CM

## 2013-10-08 DIAGNOSIS — C61 Malignant neoplasm of prostate: Secondary | ICD-10-CM

## 2013-10-08 MED ORDER — OXYCODONE HCL 10 MG PO TABS: 10.0000 mg | ORAL_TABLET | ORAL | Status: DC | PRN

## 2013-10-09 ENCOUNTER — Ambulatory Visit (HOSPITAL_COMMUNITY)
Admission: RE | Admit: 2013-10-09 | Discharge: 2013-10-09 | Disposition: A | Discharge status: Home / Self Care | Payer: Medicare Other | Source: Ambulatory Visit | Attending: Oral Surgery | Admitting: Oral Surgery

## 2013-10-09 DIAGNOSIS — M8708 Idiopathic aseptic necrosis of bone, other site: Secondary | ICD-10-CM | POA: Insufficient documentation

## 2013-10-14 ENCOUNTER — Encounter: Payer: Self-pay | Admitting: Family Medicine

## 2013-10-15 ENCOUNTER — Ambulatory Visit (INDEPENDENT_AMBULATORY_CARE_PROVIDER_SITE_OTHER): Payer: Medicare Other | Admitting: Otolaryngology

## 2013-10-15 DIAGNOSIS — J322 Chronic ethmoidal sinusitis: Secondary | ICD-10-CM

## 2013-10-15 DIAGNOSIS — J320 Chronic maxillary sinusitis: Secondary | ICD-10-CM

## 2013-10-20 ENCOUNTER — Other Ambulatory Visit: Payer: Self-pay | Admitting: Family Medicine

## 2013-11-04 ENCOUNTER — Encounter (HOSPITAL_COMMUNITY): Payer: Medicare Other | Attending: Oncology

## 2013-11-04 ENCOUNTER — Encounter (HOSPITAL_COMMUNITY): Payer: Self-pay

## 2013-11-04 ENCOUNTER — Encounter (HOSPITAL_BASED_OUTPATIENT_CLINIC_OR_DEPARTMENT_OTHER): Payer: Medicare Other

## 2013-11-04 VITALS — BP 131/83 | HR 65 | Temp 97.9°F | Resp 18 | Wt 151.0 lb

## 2013-11-04 DIAGNOSIS — M869 Osteomyelitis, unspecified: Secondary | ICD-10-CM | POA: Insufficient documentation

## 2013-11-04 DIAGNOSIS — M87180 Osteonecrosis due to drugs, jaw: Secondary | ICD-10-CM

## 2013-11-04 DIAGNOSIS — M8710 Osteonecrosis due to drugs, unspecified bone: Secondary | ICD-10-CM

## 2013-11-04 DIAGNOSIS — I2510 Atherosclerotic heart disease of native coronary artery without angina pectoris: Secondary | ICD-10-CM | POA: Insufficient documentation

## 2013-11-04 DIAGNOSIS — D491 Neoplasm of unspecified behavior of respiratory system: Secondary | ICD-10-CM | POA: Insufficient documentation

## 2013-11-04 DIAGNOSIS — M8700 Idiopathic aseptic necrosis of unspecified bone: Secondary | ICD-10-CM | POA: Insufficient documentation

## 2013-11-04 DIAGNOSIS — J328 Other chronic sinusitis: Secondary | ICD-10-CM | POA: Insufficient documentation

## 2013-11-04 DIAGNOSIS — J329 Chronic sinusitis, unspecified: Secondary | ICD-10-CM | POA: Insufficient documentation

## 2013-11-04 DIAGNOSIS — C61 Malignant neoplasm of prostate: Secondary | ICD-10-CM

## 2013-11-04 DIAGNOSIS — M8708 Idiopathic aseptic necrosis of bone, other site: Secondary | ICD-10-CM | POA: Insufficient documentation

## 2013-11-04 DIAGNOSIS — T50904A Poisoning by unspecified drugs, medicaments and biological substances, undetermined, initial encounter: Secondary | ICD-10-CM | POA: Insufficient documentation

## 2013-11-04 LAB — COMPREHENSIVE METABOLIC PANEL
ALT: 10 U/L (ref 0–53)
AST: 21 U/L (ref 0–37)
Albumin: 3.8 g/dL (ref 3.5–5.2)
Alkaline Phosphatase: 57 U/L (ref 39–117)
BUN: 8 mg/dL (ref 6–23)
CO2: 29 mEq/L (ref 19–32)
Calcium: 8.9 mg/dL (ref 8.4–10.5)
Chloride: 103 mEq/L (ref 96–112)
Creatinine, Ser: 1.07 mg/dL (ref 0.50–1.35)
GFR calc Af Amer: 86 mL/min — ABNORMAL LOW (ref >90)
GFR calc non Af Amer: 74 mL/min — ABNORMAL LOW (ref >90)
Glucose, Bld: 101 mg/dL — ABNORMAL HIGH (ref 70–99)
Potassium: 4.2 mEq/L (ref 3.7–5.3)
Sodium: 142 mEq/L (ref 137–147)
Total Bilirubin: 0.4 mg/dL (ref 0.3–1.2)
Total Protein: 6.9 g/dL (ref 6.0–8.3)

## 2013-11-04 LAB — CBC WITH DIFFERENTIAL/PLATELET
Basophils Absolute: 0.1 10*3/uL (ref 0.0–0.1)
Basophils Relative: 1 % (ref 0–1)
Eosinophils Absolute: 0.4 10*3/uL (ref 0.0–0.7)
Eosinophils Relative: 6 % — ABNORMAL HIGH (ref 0–5)
HCT: 36.9 % — ABNORMAL LOW (ref 39.0–52.0)
Hemoglobin: 12 g/dL — ABNORMAL LOW (ref 13.0–17.0)
Lymphocytes Relative: 42 % (ref 12–46)
Lymphs Abs: 2.4 10*3/uL (ref 0.7–4.0)
MCH: 29.6 pg (ref 26.0–34.0)
MCHC: 32.5 g/dL (ref 30.0–36.0)
MCV: 90.9 fL (ref 78.0–100.0)
Monocytes Absolute: 0.5 10*3/uL (ref 0.1–1.0)
Monocytes Relative: 9 % (ref 3–12)
Neutro Abs: 2.4 10*3/uL (ref 1.7–7.7)
Neutrophils Relative %: 42 % — ABNORMAL LOW (ref 43–77)
Platelets: 263 10*3/uL (ref 150–400)
RBC: 4.06 MIL/uL — ABNORMAL LOW (ref 4.22–5.81)
RDW: 13.5 % (ref 11.5–15.5)
WBC: 5.7 10*3/uL (ref 4.0–10.5)

## 2013-11-04 MED ORDER — OXYCODONE HCL 10 MG PO TABS: 10.0000 mg | ORAL_TABLET | ORAL | Status: DC | PRN

## 2013-11-04 MED ORDER — OXYCODONE HCL ER 60 MG PO T12A: 60.0000 mg | EXTENDED_RELEASE_TABLET | Freq: Three times a day (TID) | ORAL | Status: DC

## 2013-11-04 NOTE — Progress Notes (Signed)
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   INTERVAL HISTORY: [name] [name] 58 y.o. male returns for followup after recent ENT evaluation with biopsy of sinus on 09/28/2013 because of swelling of the jaw requiring high doses of opioids for control of pain.  He was seen by Dr.Teoh cleared his sinuses. Biopsy was negative for cancer. He feels 100% better with pain at a level of 3/10. He is able 8. He is sleeping better. He denies a diarrhea, hot flashes, breast pain, lower extremity swelling or redness, chest pain, PND, orthopnea, palpitations, skin rash, headache, or seizures.  MEDICAL HISTORY: Past Medical History  Diagnosis Date   COPD (chronic obstructive pulmonary disease)    Sigmoid diverticulosis    DDD (degenerative disc disease)    Anxiety    Depression    Psoriasis    Tubular adenoma     Colonoscopy 1/12   Prostate cancer     Metastatic, stage IV  - 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      Patient Information    Patient Name Sex DOB SSN   [name] [name], [name] [name] Male 14-May-1956 [id number]         Transcription    Type ID Status Author   Operative Note [id number] Authenticated [name], MD       Signed by [name], MD on 09/29/13 at 0729       Transcription Text   NAME: [name] [name], [name] [name] ACCOUNT NO.: [id number]  MEDICAL RECORD NO.: [id number]  LOCATION: FACILITY:  PHYSICIAN: Leta Baptist, MD DATE OF BIRTH: 04-16-1956  DATE OF PROCEDURE: 09/28/2013  DATE OF DISCHARGE:  OPERATIVE REPORT  SURGEON: Leta Baptist, M.D.  PREOPERATIVE DIAGNOSIS: Left maxillary and ethmoid sinus soft tissue  mass.  POSTOPERATIVE DIAGNOSIS: Left maxillary and ethmoid sinus  soft tissue  Mass. left ethmoid and maxillary sinusitis.  PROCEDURE PERFORMED:  1. Left endoscopic maxillary antrostomy with tissue removal.  2. Left endoscopic anterior ethmoidectomy.  ANESTHESIA: General endotracheal tube anesthesia.  COMPLICATIONS: None.  ESTIMATED BLOOD LOSS: Less than 100 mL.  INDICATION FOR PROCEDURE: The patient is a 58 year old male with a  history of stage IV prostate cancer. He was recently noted to have  possible left maxillary osteonecrosis secondary to his chemotherapy. On  his MRI scan, he was noted to have a large solid soft tissue mass within  the left  maxillary sinus, with extension into the anterior ethmoid  cavities. The findings were worrisome for possible metastasis to the  paranasal sinuses or primary paranasal sinus neoplasm. In addition, the  patient also complains of a foul smelling drainage from his left nasal  cavity for the past 6 months. Based on the above findings, the decision  was made for the patient to undergo the above stated procedures, with  removal of the soft tissue mass for histologic identification. The  risks, benefits, alternatives, and details of the procedure were  discussed with the patient. Questions were invited and answered.  Informed consent was obtained.  DESCRIPTION OF PROCEDURE: The patient was taken to the operating room  and placed supine on the operating table. General endotracheal tube  anesthesia was administered by the anesthesiologist. The patient was  positioned and prepped and draped in a standard fashion for sinonasal  surgery. Pledgets soaked with Afrin were placed in both nasal cavities  for vasoconstriction. The pledgets were subsequently removed. Severe  nasal septal deviation to the right was noted. Endoscopic evaluation of  the left nasal cavity revealed polypoid tissue within the middle meatus.  The middle turbinate was carefully medialized. The uncinate process was  removed with a Soil scientist. Polypoid tissue was noted to be  emanating from the maxillary opening. Significant bleeding was noted at  this time. The bleeding was controlled with pledgets soaked with 4%  cocaine. The soft tissue was then removed in a piecemeal fashion using  a combination of Tru-Cut forceps and Blakesley forceps. The maxillary  opening was carefully enlarged using backbiter forceps and Tru-Cut  forceps. Small polypoid soft tissue was removed from the left maxillary  sinus. The sinus cavity was irrigated. The anterior ethmoid air cells  were then opened using combination of Tru-Cut forceps and suction    catheters. Polypoid tissue was also removed from the anterior ethmoid  cavities. It should also be noted that a moderate amount of purulent  drainage was encountered during the procedure. The findings were  suggestive of chronic left maxillary and ethmoid sinusitis. NasoPore  packing was then used to pack the middle meatus and the rest of the left  nasal cavity.  That concluded the procedure for the patient. The care of the patient  was turned over to the anesthesiologist. The patient was awakened from  anesthesia without difficulty. He was extubated and transferred to the  recovery room in good condition.  OPERATIVE FINDINGS: Left maxillary and ethmoid soft tissue mass. The  appearance of the soft tissue mass was suggestive of inflamed polypoid  tissue. However, definite histologic identification will be performed  by the pathologist.  SPECIMEN: Left maxillary and ethmoid soft tissue mass.  FOLLOWUP CARE: The patient will be discharged home once he is awake and  alert. He will be placed on amoxicillin 875 mg p.o. b.i.d. for 5 days,  and Percocet p.r.n. pain. The patient will follow  up in my office on  Thursday for nasal debridement.  Leta Baptist, MD  ST/MEDQ D: 09/28/2013 T: 09/29/2013 Job: [id number]  cc: [facility]    ALLERGIES:  is allergic to other; pamidronate; and zoledronic acid.  MEDICATIONS: has a current medication list which includes the following prescription(s): albuterol, alprazolam, calcipotriene, calcipotriene-betamethasone, calcium carbonate-vitamin d, capex, chlorhexidine, clobetasol propionate, clobex spray, lumbar back brace/support pad, gabapentin, meclizine, metoprolol succinate, nitroglycerin, oxycodone hcl, oxycodone hcl er, pantoprazole, pravastatin, and taclonex.  SURGICAL HISTORY:  Past Surgical History  Procedure Laterality Date   Anterior fusion cervical spine     Posterior fusion cervical spine     Left foot surgery     Prostatectomy  02/28/2006     [name] in [place], [place].   Appendectomy     Heart stents  06/2010    2   Ethmoidectomy Left 09/28/2013    Procedure: LEFT ENDOSCOPIC ANTERIOR ETHMOIDECTOMY;  Surgeon: [name], MD;  Location: [facility];  Service: ENT;  Laterality: Left;   Maxillary antrostomy Left 09/28/2013    Procedure: LEFT ENDOSCOPIC MAXILLARY ANTROSTOMY WITH REMOVAL OF TISSUE;  Surgeon: [name], MD;  Location: [facility];  Service: ENT;  Laterality: Left;    FAMILY HISTORY: family history includes Alzheimer's disease in his mother; Heart attack in his brother; Lung cancer in his father.  SOCIAL HISTORY:  reports that he quit smoking about 7 years ago. His smoking use included Cigarettes. He has a 15 pack-year smoking history. He has never used smokeless tobacco. He reports that he does not drink alcohol or use illicit drugs.  REVIEW OF SYSTEMS:  Other than that discussed above is noncontributory.  PHYSICAL EXAMINATION: ECOG PERFORMANCE STATUS: 2 - Symptomatic, <50% confined to bed  Blood pressure 131/83, pulse 65, temperature 97.9 F (36.6 C), temperature source Oral, resp. rate 18, weight 151 lb (68.493 kg).  GENERAL:alert, no distress and comfortable SKIN: skin color, texture, turgor are normal, no rashes or significant lesions EYES: PERLA; Conjunctiva are pink and non-injected, sclera clear SINUSES: No redness or tenderness over maxillary or ethmoid sinuses OROPHARYNX:no exudate, no erythema on lips, buccal mucosa, or tongue. Left upper jaw with less swelling and no evidence of purulence or bleeding. NECK: supple, thyroid normal size, non-tender, without nodularity. No masses CHEST: Normal AP diameter with bilateral gynecomastia. LYMPH:  no palpable lymphadenopathy in the cervical, axillary or inguinal LUNGS: clear to auscultation and percussion with normal breathing effort HEART: regular rate & rhythm and no murmurs. ABDOMEN:abdomen soft, non-tender and normal bowel  sounds MUSCULOSKELETAL:no cyanosis of digits and no clubbing. Range of motion normal.  NEURO: alert & oriented x 3 with fluent speech, no focal motor/sensory deficits   LABORATORY DATA: Infusion on 11/04/2013  Component Date Value Ref Range Status   WBC 11/04/2013 5.7  4.0 - 10.5 K/uL Final   RBC 11/04/2013 4.06* 4.22 - 5.81 MIL/uL Final   Hemoglobin 11/04/2013 12.0* 13.0 - 17.0 g/dL Final   HCT 11/04/2013 36.9* 39.0 - 52.0 % Final   MCV 11/04/2013 90.9  78.0 - 100.0 fL Final   MCH 11/04/2013 29.6  26.0 - 34.0 pg Final   MCHC 11/04/2013 32.5  30.0 - 36.0 g/dL Final   RDW 11/04/2013 13.5  11.5 - 15.5 % Final   Platelets 11/04/2013 263  150 - 400 K/uL Final   Neutrophils Relative % 11/04/2013 42* 43 - 77 % Final   Neutro Abs 11/04/2013 2.4  1.7 - 7.7 K/uL Final  Lymphocytes Relative 11/04/2013 42  12 - 46 % Final   Lymphs Abs 11/04/2013 2.4  0.7 - 4.0 K/uL Final   Monocytes Relative 11/04/2013 9  3 - 12 % Final   Monocytes Absolute 11/04/2013 0.5  0.1 - 1.0 K/uL Final   Eosinophils Relative 11/04/2013 6* 0 - 5 % Final   Eosinophils Absolute 11/04/2013 0.4  0.0 - 0.7 K/uL Final   Basophils Relative 11/04/2013 1  0 - 1 % Final   Basophils Absolute 11/04/2013 0.1  0.0 - 0.1 K/uL Final    PATHOLOGY:  FINAL for [name] [name], [name] [name] [id number]) Patient: [name] [name], [name] [name] Collected: 09/28/2013 Client: [facility] Accession: [id number] Received: 09/28/2013 Raylene Miyamoto, MD DOB: 09-Jun-1955 Age: 36 Gender: M Reported: 09/29/2013 [address] Patient Ph: [phone] MRN #: [id number] [place], [place] [address] Visit #: [id number] Chart #: Phone:  Fax: CC: REPORT OF SURGICAL PATHOLOGY FINAL DIAGNOSIS Diagnosis Sinus contents, Left ethmoid and maxillary sinus soft tissue - CHRONIC SINUSITIS. - THERE IS NO EVIDENCE OF MALIGNANCY. Enid Cutter MD Pathologist, Electronic Signature (Case signed 09/29/2013) Specimen Gross and Clinical Information Specimen(s)  Obtained: Sinus contents, Left ethmoid and maxillary sinus soft tissue Specimen Clinical Information Left ethmoid sinusitis (tl) Gross Received in formalin are irregular fragments of soft tan-pink tissue and fragments of tan-white osteocartilaginous tissue. The specimen has an aggregate measurement of 3 x 3 x 0.7 cm. Sections are submitted in one cassette. (GRP:ecj 09/28/2013) Report signed out from the following location(s) Technical Component and Interpretation performed    Urinalysis No results found for this basename: colorurine,  appearanceur,  labspec,  phurine,  glucoseu,  hgbur,  bilirubinur,  ketonesur,  proteinur,  urobilinogen,  nitrite,  leukocytesur    RADIOGRAPHIC STUDIES: Ct Maxillofacial Wo Cm  2013-10-22   CLINICAL DATA:  Aseptic necrosis of jaw  EXAM: CT MAXILLOFACIAL WITHOUT CONTRAST  TECHNIQUE: Multidetector CT imaging of the maxillofacial structures was performed. Multiplanar CT image reconstructions were also generated. A small metallic BB was placed on the right temple in order to reliably differentiate right from left.  COMPARISON:  MRI face 09/16/2013  FINDINGS: Interval sinus surgery with medial antrectomy left maxillary sinus. There is improvement in left maxillary sinus mucosal edema however there remains extensive mucosal edema in the sinus. The left ethmoid sinus also has improved. There is a linear bony fragment within the mucosal edema of the left maxillary sinus related to recent surgery. This is probably the medial wall of the left maxillary sinus.  There is a fracture of the alveolar ridge of the left maxilla. This bone is somewhat sclerotic and this may be a septic necrosis of the bone. This bone was sclerotic and irregular on the prior MRI and the fracture may have been present at that time although better seen by CT than MRI. This may be aseptic necrosis of bone. No upper teeth are present. There is diffuse bony thickening of the left maxillary wall secondary  to chronic sinusitis. Pathology of the left maxillary sinus revealed chronic sinusitis and no malignancy.  Mild mucosal edema in the left ethmoid and left frontal sinus. Mild mucosal edema base of right maxillary sinus. Right ethmoid sinus is clear. Mild mucosal thickening in the sphenoid sinus bilaterally. Concha bullosa of the right middle turbinate.  Gas bubbles Stensen's duct extending into the left parotid gland where there are branching gas bubbles in the duct system. This is most likely related to communication with the oral cavity and is related to the above  surgery and bony changes.  IMPRESSION: Left maxillary sinus surgery. Improved aeration left maxillary sinus with considerable residual mucosal thickening and bony thickening compatible with chronic sinusitis. Thin bony fragment is present in the left maxillary sinus.  Sclerotic alveolus of the maxilla on the left which is fractured. This may be due to osteonecrosis.  Gas in Stensen's duct and the left parotid gland.   Electronically Signed   By: Franchot Gallo M.D.   On: 10/09/2013 14:26    ASSESSMENT:  #1. Carcinoma of prostate, no evidence of disease with normal PSA, due for Eligard on 12/09/2013. #2. Osteonecrosis of the jaw, status post debridement with excellent result, excellent pain control. #3. Urinary artery disease, status post stenting x2 the last in January 2012, no evidence of heart failure or dysrhythmia. #4. Status post anterior and posterior fusion of the cervical spine, no neurologic deficit.   PLAN:  #1. Continue Aloxi Contin and oxycodone as ordered. #2. Return on 12/10/22 visit and continuation of Eligard every 6 months.   All questions were answered. The patient knows to call the clinic with any problems, questions or concerns. We can certainly see the patient much sooner if necessary.   I spent 25 minutes counseling the patient face to face. The total time spent in the appointment was 30 minutes.    Farrel Gobble, MD 11/04/2013 10:07 AM  DISCLAIMER:  This note was dictated with voice recognition software.  Similar sounding words can inadvertently be transcribed inaccurately and may not be corrected upon review.

## 2013-11-04 NOTE — Progress Notes (Signed)
Labs drawn for cmp, cbc/diff

## 2013-11-04 NOTE — Patient Instructions (Signed)
[facility] Discharge Instructions  RECOMMENDATIONS MADE BY THE CONSULTANT AND ANY TEST RESULTS WILL BE SENT TO YOUR REFERRING PHYSICIAN.  We will see you in July for repeat labs, a doctor visit and your injection. Please call for any questions or concerns. Prescriptions given your pain medications.  Thank you for choosing [facility] to provide your oncology and hematology care.  To afford each patient quality time with our providers, please arrive at least 15 minutes before your scheduled appointment time.  With your help, our goal is to use those 15 minutes to complete the necessary work-up to ensure our physicians have the information they need to help with your evaluation and healthcare recommendations.    Effective January 1st, 2014, we ask that you re-schedule your appointment with our physicians should you arrive 10 or more minutes late for your appointment.  We strive to give you quality time with our providers, and arriving late affects you and other patients whose appointments are after yours.    Again, thank you for choosing [facility].  Our hope is that these requests will decrease the amount of time that you wait before being seen by our physicians.       _____________________________________________________________  Should you have questions after your visit to [facility], please contact our office at [phone] between the hours of 8:30 a.m. and 5:00 p.m.  Voicemails left after 4:30 p.m. will not be returned until the following business day.  For prescription refill requests, have your pharmacy contact our office with your prescription refill request.

## 2013-11-05 ENCOUNTER — Ambulatory Visit (INDEPENDENT_AMBULATORY_CARE_PROVIDER_SITE_OTHER): Payer: Medicare Other | Admitting: Otolaryngology

## 2013-11-05 DIAGNOSIS — J322 Chronic ethmoidal sinusitis: Secondary | ICD-10-CM

## 2013-11-05 DIAGNOSIS — J320 Chronic maxillary sinusitis: Secondary | ICD-10-CM

## 2013-11-18 ENCOUNTER — Other Ambulatory Visit: Payer: Self-pay | Admitting: Family Medicine

## 2013-11-30 ENCOUNTER — Other Ambulatory Visit (HOSPITAL_COMMUNITY): Payer: Self-pay | Admitting: Oncology

## 2013-11-30 DIAGNOSIS — C61 Malignant neoplasm of prostate: Secondary | ICD-10-CM

## 2013-11-30 DIAGNOSIS — M8710 Osteonecrosis due to drugs, unspecified bone: Secondary | ICD-10-CM

## 2013-11-30 MED ORDER — OXYCODONE HCL ER 60 MG PO T12A: 60.0000 mg | EXTENDED_RELEASE_TABLET | Freq: Three times a day (TID) | ORAL | Status: DC

## 2013-11-30 MED ORDER — OXYCODONE HCL 10 MG PO TABS: 10.0000 mg | ORAL_TABLET | ORAL | Status: DC | PRN

## 2013-12-01 ENCOUNTER — Other Ambulatory Visit (HOSPITAL_COMMUNITY): Payer: Self-pay | Admitting: Oncology

## 2013-12-01 DIAGNOSIS — C772 Secondary and unspecified malignant neoplasm of intra-abdominal lymph nodes: Secondary | ICD-10-CM

## 2013-12-01 DIAGNOSIS — M810 Age-related osteoporosis without current pathological fracture: Secondary | ICD-10-CM

## 2013-12-01 DIAGNOSIS — C61 Malignant neoplasm of prostate: Secondary | ICD-10-CM

## 2013-12-07 ENCOUNTER — Ambulatory Visit (HOSPITAL_COMMUNITY)
Admission: RE | Admit: 2013-12-07 | Discharge: 2013-12-07 | Disposition: A | Discharge status: Home / Self Care | Payer: Medicare Other | Source: Ambulatory Visit | Attending: Oncology | Admitting: Oncology

## 2013-12-07 DIAGNOSIS — C61 Malignant neoplasm of prostate: Secondary | ICD-10-CM

## 2013-12-07 DIAGNOSIS — M810 Age-related osteoporosis without current pathological fracture: Secondary | ICD-10-CM | POA: Insufficient documentation

## 2013-12-07 DIAGNOSIS — C772 Secondary and unspecified malignant neoplasm of intra-abdominal lymph nodes: Secondary | ICD-10-CM

## 2013-12-09 ENCOUNTER — Ambulatory Visit (HOSPITAL_COMMUNITY): Payer: Self-pay

## 2013-12-09 ENCOUNTER — Other Ambulatory Visit (HOSPITAL_COMMUNITY): Payer: Self-pay

## 2013-12-11 ENCOUNTER — Encounter (HOSPITAL_COMMUNITY): Payer: Self-pay

## 2013-12-24 ENCOUNTER — Other Ambulatory Visit (HOSPITAL_COMMUNITY): Payer: Self-pay | Admitting: Oncology

## 2013-12-28 DIAGNOSIS — G894 Chronic pain syndrome: Secondary | ICD-10-CM | POA: Insufficient documentation

## 2014-01-06 ENCOUNTER — Telehealth: Payer: Self-pay | Admitting: *Deleted

## 2014-01-06 ENCOUNTER — Encounter: Payer: Self-pay | Admitting: Family Medicine

## 2014-01-06 ENCOUNTER — Ambulatory Visit (HOSPITAL_COMMUNITY)
Admission: RE | Admit: 2014-01-06 | Discharge: 2014-01-06 | Disposition: A | Discharge status: Home / Self Care | Payer: Medicare Other | Source: Ambulatory Visit | Attending: Family Medicine | Admitting: Family Medicine

## 2014-01-06 ENCOUNTER — Ambulatory Visit (INDEPENDENT_AMBULATORY_CARE_PROVIDER_SITE_OTHER): Payer: Medicare Other | Admitting: Family Medicine

## 2014-01-06 VITALS — BP 116/72 | Ht 69.0 in | Wt 147.0 lb

## 2014-01-06 DIAGNOSIS — Q742 Other congenital malformations of lower limb(s), including pelvic girdle: Secondary | ICD-10-CM | POA: Insufficient documentation

## 2014-01-06 DIAGNOSIS — M25579 Pain in unspecified ankle and joints of unspecified foot: Secondary | ICD-10-CM

## 2014-01-06 DIAGNOSIS — M25572 Pain in left ankle and joints of left foot: Secondary | ICD-10-CM

## 2014-01-06 NOTE — Progress Notes (Signed)
   Subjective:    Patient ID: [name] [name], male    DOB: 1955-11-30, 58 y.o.   MRN: [id number]  Foot Pain This is a new problem. The current episode started today. The symptoms are aggravated by walking and standing. Treatments tried: crutches.  Taking pain meds. Oxycodone 10mg  and 60mg .   Patient stepped awkwardly in a house. Felt ankle turn. Felt popping sensation. Now swelling and discomfort when walking.  Review of Systems No knee pain no hip pain no loss of consciousness claims no loss of balance ROS otherwise negative    Objective:   Physical Exam  Alert vitals stable. Lungs clear heart rare rhythm lateral foot tender to palpation some swelling      Assessment & Plan:  Impression contusion/ankle sprain doubt fracture though potential& plan sent for x-ray x-ray negative since Medicare discussed use crutches elevation of foot expect gradual resolution.

## 2014-01-06 NOTE — Telephone Encounter (Signed)
Pt notified of foot xray results. Nothing acute on xray per Dr. [name].

## 2014-01-07 ENCOUNTER — Ambulatory Visit (INDEPENDENT_AMBULATORY_CARE_PROVIDER_SITE_OTHER): Payer: Medicare Other | Admitting: Otolaryngology

## 2014-01-07 DIAGNOSIS — J0100 Acute maxillary sinusitis, unspecified: Secondary | ICD-10-CM

## 2014-01-07 DIAGNOSIS — J322 Chronic ethmoidal sinusitis: Secondary | ICD-10-CM

## 2014-01-07 DIAGNOSIS — J320 Chronic maxillary sinusitis: Secondary | ICD-10-CM

## 2014-01-11 ENCOUNTER — Other Ambulatory Visit (HOSPITAL_COMMUNITY): Payer: Self-pay

## 2014-01-19 ENCOUNTER — Other Ambulatory Visit: Payer: Self-pay | Admitting: Nurse Practitioner

## 2014-01-21 ENCOUNTER — Ambulatory Visit (INDEPENDENT_AMBULATORY_CARE_PROVIDER_SITE_OTHER): Payer: Medicare Other | Admitting: Otolaryngology

## 2014-01-21 DIAGNOSIS — J322 Chronic ethmoidal sinusitis: Secondary | ICD-10-CM

## 2014-01-21 DIAGNOSIS — J320 Chronic maxillary sinusitis: Secondary | ICD-10-CM

## 2014-01-23 ENCOUNTER — Other Ambulatory Visit (HOSPITAL_COMMUNITY): Payer: Self-pay | Admitting: Oncology

## 2014-01-25 ENCOUNTER — Other Ambulatory Visit (HOSPITAL_COMMUNITY): Payer: Self-pay | Admitting: Oncology

## 2014-01-25 DIAGNOSIS — M8710 Osteonecrosis due to drugs, unspecified bone: Secondary | ICD-10-CM

## 2014-01-25 DIAGNOSIS — C61 Malignant neoplasm of prostate: Secondary | ICD-10-CM

## 2014-01-25 DIAGNOSIS — F419 Anxiety disorder, unspecified: Secondary | ICD-10-CM

## 2014-01-25 MED ORDER — ALPRAZOLAM 0.5 MG PO TABS: 0.5000 mg | ORAL_TABLET | Freq: Four times a day (QID) | ORAL | Status: DC | PRN

## 2014-02-11 ENCOUNTER — Ambulatory Visit (INDEPENDENT_AMBULATORY_CARE_PROVIDER_SITE_OTHER): Payer: Medicare Other | Admitting: Otolaryngology

## 2014-02-11 DIAGNOSIS — J320 Chronic maxillary sinusitis: Secondary | ICD-10-CM

## 2014-02-11 DIAGNOSIS — J322 Chronic ethmoidal sinusitis: Secondary | ICD-10-CM

## 2014-03-01 ENCOUNTER — Encounter: Payer: Self-pay | Admitting: Family Medicine

## 2014-03-01 ENCOUNTER — Ambulatory Visit (INDEPENDENT_AMBULATORY_CARE_PROVIDER_SITE_OTHER): Payer: Medicare Other | Admitting: Family Medicine

## 2014-03-01 VITALS — BP 118/80 | Ht 69.0 in | Wt 153.0 lb

## 2014-03-01 DIAGNOSIS — I2510 Atherosclerotic heart disease of native coronary artery without angina pectoris: Secondary | ICD-10-CM | POA: Diagnosis not present

## 2014-03-01 DIAGNOSIS — Z23 Encounter for immunization: Secondary | ICD-10-CM

## 2014-03-01 DIAGNOSIS — E782 Mixed hyperlipidemia: Secondary | ICD-10-CM | POA: Diagnosis not present

## 2014-03-01 DIAGNOSIS — Z79899 Other long term (current) drug therapy: Secondary | ICD-10-CM | POA: Diagnosis not present

## 2014-03-01 DIAGNOSIS — K219 Gastro-esophageal reflux disease without esophagitis: Secondary | ICD-10-CM

## 2014-03-01 NOTE — Progress Notes (Signed)
   Subjective:    Patient ID: [name] [name], male    DOB: [birth year], 58 y.o.   MRN: [id number]  Hyperlipidemia This is a chronic problem. The current episode started more than 1 year ago. There are no compliance problems.   Flu vaccine.   Ingrown toenail on both great toes. No fever no chills no discharge  Patient has history of hyperlipidemia. Compliant with medication. No obvious side effects.  No reflux no heartburn as long as stays on medication. No recent GI bleeding fortunately.  Trouble sleeping at night. Xanax does help.  All by specialist for prostate cancer.  Known coronary artery disease. No current chest pain walking some.  Review of Systems No headache no chest pain no back pain no abdominal pain no change in bowel habits no blood in stool ROS otherwise    Objective:   Physical Exam  Alert blood pressure 132 or 78 on repeat HEENT normal. Lungs clear. Heart regular in rhythm. Ankles edema.      Assessment & Plan:  Impression 1 reflux clinically stable #2 hyperlipidemia status uncertain #3 coronary artery disease clinically silent #4 insomnia/anxiety. 5 prostate cancer discussed plan appropriate blood work. Flu vaccine. Diet exercise discussed. Followup every 6 months. WSL

## 2014-03-16 ENCOUNTER — Other Ambulatory Visit: Payer: Self-pay | Admitting: Family Medicine

## 2014-03-18 LAB — HEPATIC FUNCTION PANEL
ALT: 11 U/L (ref 0–53)
AST: 21 U/L (ref 0–37)
Albumin: 3.6 g/dL (ref 3.5–5.2)
Alkaline Phosphatase: 56 U/L (ref 39–117)
Bilirubin, Direct: 0.1 mg/dL (ref 0.0–0.3)
Indirect Bilirubin: 0.2 mg/dL (ref 0.2–1.2)
Total Bilirubin: 0.3 mg/dL (ref 0.2–1.2)
Total Protein: 5.8 g/dL — ABNORMAL LOW (ref 6.0–8.3)

## 2014-03-18 LAB — LIPID PANEL
Cholesterol: 125 mg/dL (ref 0–200)
HDL: 39 mg/dL — ABNORMAL LOW (ref >39)
LDL Cholesterol: 56 mg/dL (ref 0–99)
Total CHOL/HDL Ratio: 3.2 Ratio
Triglycerides: 151 mg/dL — ABNORMAL HIGH (ref <150)
VLDL: 30 mg/dL (ref 0–40)

## 2014-03-24 ENCOUNTER — Other Ambulatory Visit: Payer: Self-pay | Admitting: Family Medicine

## 2014-04-02 ENCOUNTER — Telehealth: Payer: Self-pay | Admitting: Family Medicine

## 2014-04-02 NOTE — Telephone Encounter (Signed)
Rx prior auth APPROVED for pt's Clobetasol Propionate 0.05 % shampoo, auth is good for 1 year, faxed approval to [facility]

## 2014-04-18 ENCOUNTER — Other Ambulatory Visit: Payer: Self-pay | Admitting: Family Medicine

## 2014-04-21 DIAGNOSIS — G629 Polyneuropathy, unspecified: Secondary | ICD-10-CM | POA: Insufficient documentation

## 2014-05-13 ENCOUNTER — Ambulatory Visit (INDEPENDENT_AMBULATORY_CARE_PROVIDER_SITE_OTHER): Payer: Medicare Other | Admitting: Otolaryngology

## 2014-05-13 DIAGNOSIS — J320 Chronic maxillary sinusitis: Secondary | ICD-10-CM

## 2014-05-16 ENCOUNTER — Other Ambulatory Visit: Payer: Self-pay | Admitting: Family Medicine

## 2014-05-21 ENCOUNTER — Other Ambulatory Visit (HOSPITAL_COMMUNITY): Payer: Self-pay | Admitting: Oncology

## 2014-06-03 ENCOUNTER — Ambulatory Visit (INDEPENDENT_AMBULATORY_CARE_PROVIDER_SITE_OTHER): Payer: Medicare Other | Admitting: Otolaryngology

## 2014-06-03 DIAGNOSIS — J0101 Acute recurrent maxillary sinusitis: Secondary | ICD-10-CM

## 2014-06-24 ENCOUNTER — Ambulatory Visit (INDEPENDENT_AMBULATORY_CARE_PROVIDER_SITE_OTHER): Payer: Medicare Other | Admitting: Otolaryngology

## 2014-06-24 DIAGNOSIS — J0101 Acute recurrent maxillary sinusitis: Secondary | ICD-10-CM

## 2014-06-24 DIAGNOSIS — J310 Chronic rhinitis: Secondary | ICD-10-CM

## 2014-06-29 DIAGNOSIS — M810 Age-related osteoporosis without current pathological fracture: Secondary | ICD-10-CM | POA: Insufficient documentation

## 2014-07-16 ENCOUNTER — Other Ambulatory Visit: Payer: Self-pay | Admitting: Family Medicine

## 2014-07-16 NOTE — Telephone Encounter (Signed)
Last seen 03/01/14

## 2014-08-10 ENCOUNTER — Other Ambulatory Visit: Payer: Self-pay | Admitting: Family Medicine

## 2014-08-16 ENCOUNTER — Emergency Department (HOSPITAL_COMMUNITY)
Admission: EM | Admit: 2014-08-16 | Discharge: 2014-08-16 | Disposition: A | Discharge status: Home / Self Care | Payer: Medicare Other | Source: Home / Self Care | Attending: Emergency Medicine | Admitting: Emergency Medicine

## 2014-08-16 ENCOUNTER — Emergency Department (HOSPITAL_COMMUNITY): Payer: Medicare Other

## 2014-08-16 ENCOUNTER — Encounter (HOSPITAL_COMMUNITY): Payer: Self-pay

## 2014-08-16 ENCOUNTER — Encounter: Payer: Self-pay | Admitting: Family Medicine

## 2014-08-16 ENCOUNTER — Ambulatory Visit (INDEPENDENT_AMBULATORY_CARE_PROVIDER_SITE_OTHER): Payer: Medicare Other | Admitting: Family Medicine

## 2014-08-16 VITALS — BP 132/80 | Temp 98.6°F | Ht 69.0 in

## 2014-08-16 DIAGNOSIS — J4521 Mild intermittent asthma with (acute) exacerbation: Secondary | ICD-10-CM

## 2014-08-16 DIAGNOSIS — R509 Fever, unspecified: Secondary | ICD-10-CM | POA: Diagnosis not present

## 2014-08-16 DIAGNOSIS — J111 Influenza due to unidentified influenza virus with other respiratory manifestations: Secondary | ICD-10-CM

## 2014-08-16 DIAGNOSIS — E876 Hypokalemia: Secondary | ICD-10-CM | POA: Diagnosis not present

## 2014-08-16 DIAGNOSIS — J209 Acute bronchitis, unspecified: Secondary | ICD-10-CM

## 2014-08-16 LAB — CBC WITH DIFFERENTIAL/PLATELET
Basophils Absolute: 0 10*3/uL (ref 0.0–0.1)
Basophils Relative: 1 % (ref 0–1)
Eosinophils Absolute: 0 10*3/uL (ref 0.0–0.7)
Eosinophils Relative: 0 % (ref 0–5)
HCT: 34.3 % — ABNORMAL LOW (ref 39.0–52.0)
Hemoglobin: 11.4 g/dL — ABNORMAL LOW (ref 13.0–17.0)
Lymphocytes Relative: 16 % (ref 12–46)
Lymphs Abs: 1.1 10*3/uL (ref 0.7–4.0)
MCH: 29.1 pg (ref 26.0–34.0)
MCHC: 33.2 g/dL (ref 30.0–36.0)
MCV: 87.5 fL (ref 78.0–100.0)
Monocytes Absolute: 0.8 10*3/uL (ref 0.1–1.0)
Monocytes Relative: 13 % — ABNORMAL HIGH (ref 3–12)
Neutro Abs: 4.6 10*3/uL (ref 1.7–7.7)
Neutrophils Relative %: 70 % (ref 43–77)
Platelets: 256 10*3/uL (ref 150–400)
RBC: 3.92 MIL/uL — ABNORMAL LOW (ref 4.22–5.81)
RDW: 13.3 % (ref 11.5–15.5)
WBC: 6.6 10*3/uL (ref 4.0–10.5)

## 2014-08-16 LAB — BASIC METABOLIC PANEL
Anion gap: 10 (ref 5–15)
BUN: 9 mg/dL (ref 6–23)
CO2: 24 mmol/L (ref 19–32)
Calcium: 8.8 mg/dL (ref 8.4–10.5)
Chloride: 100 mmol/L (ref 96–112)
Creatinine, Ser: 0.92 mg/dL (ref 0.50–1.35)
GFR calc Af Amer: >90 mL/min (ref >90)
GFR calc non Af Amer: 90 mL/min — ABNORMAL LOW (ref >90)
Glucose, Bld: 105 mg/dL — ABNORMAL HIGH (ref 70–99)
Potassium: 3.1 mmol/L — ABNORMAL LOW (ref 3.5–5.1)
Sodium: 134 mmol/L — ABNORMAL LOW (ref 135–145)

## 2014-08-16 MED ORDER — PREDNISONE 10 MG PO TABS: 20.0000 mg | ORAL_TABLET | Freq: Two times a day (BID) | ORAL | Status: DC

## 2014-08-16 MED ORDER — AZITHROMYCIN 250 MG PO TABS: ORAL_TABLET | ORAL | Status: DC

## 2014-08-16 MED ORDER — HYDROCOD POLST-CHLORPHEN POLST 10-8 MG/5ML PO LQCR: 5.0000 mL | Freq: Two times a day (BID) | ORAL | Status: DC | PRN

## 2014-08-16 NOTE — Discharge Instructions (Signed)
Zithromax and prednisone as prescribed.  Tussionex as needed for cough.  Return to the emergency department if your symptoms substantially worsen or change.   Acute Bronchitis Bronchitis is inflammation of the airways that extend from the windpipe into the lungs (bronchi). The inflammation often causes mucus to develop. This leads to a cough, which is the most common symptom of bronchitis.  In acute bronchitis, the condition usually develops suddenly and goes away over time, usually in a couple weeks. Smoking, allergies, and asthma can make bronchitis worse. Repeated episodes of bronchitis may cause further lung problems.  CAUSES Acute bronchitis is most often caused by the same virus that causes a cold. The virus can spread from person to person (contagious) through coughing, sneezing, and touching contaminated objects. SIGNS AND SYMPTOMS   Cough.   Fever.   Coughing up mucus.   Body aches.   Chest congestion.   Chills.   Shortness of breath.   Sore throat.  DIAGNOSIS  Acute bronchitis is usually diagnosed through a physical exam. Your health care provider will also ask you questions about your medical history. Tests, such as chest X-rays, are sometimes done to rule out other conditions.  TREATMENT  Acute bronchitis usually goes away in a couple weeks. Oftentimes, no medical treatment is necessary. Medicines are sometimes given for relief of fever or cough. Antibiotic medicines are usually not needed but may be prescribed in certain situations. In some cases, an inhaler may be recommended to help reduce shortness of breath and control the cough. A cool mist vaporizer may also be used to help thin bronchial secretions and make it easier to clear the chest.  HOME CARE INSTRUCTIONS  Get plenty of rest.   Drink enough fluids to keep your urine clear or pale yellow (unless you have a medical condition that requires fluid restriction). Increasing fluids may help thin your  respiratory secretions (sputum) and reduce chest congestion, and it will prevent dehydration.   Take medicines only as directed by your health care provider.  If you were prescribed an antibiotic medicine, finish it all even if you start to feel better.  Avoid smoking and secondhand smoke. Exposure to cigarette smoke or irritating chemicals will make bronchitis worse. If you are a smoker, consider using nicotine gum or skin patches to help control withdrawal symptoms. Quitting smoking will help your lungs heal faster.   Reduce the chances of another bout of acute bronchitis by washing your hands frequently, avoiding people with cold symptoms, and trying not to touch your hands to your mouth, nose, or eyes.   Keep all follow-up visits as directed by your health care provider.  SEEK MEDICAL CARE IF: Your symptoms do not improve after 1 week of treatment.  SEEK IMMEDIATE MEDICAL CARE IF:  You develop an increased fever or chills.   You have chest pain.   You have severe shortness of breath.  You have bloody sputum.   You develop dehydration.  You faint or repeatedly feel like you are going to pass out.  You develop repeated vomiting.  You develop a severe headache. MAKE SURE YOU:   Understand these instructions.  Will watch your condition.  Will get help right away if you are not doing well or get worse. Document Released: 06/28/2004 Document Revised: 10/05/2013 Document Reviewed: 11/11/2012 [facility] Patient Information 2015 [place], [place]. This information is not intended to replace advice given to you by your health care provider. Make sure you discuss any questions you have with your health  care provider.  

## 2014-08-16 NOTE — ED Provider Notes (Signed)
CSN: [id number]     Arrival date & time 08/16/14  1645 History   First MD Initiated Contact with Patient 08/16/14 1951     Chief Complaint  Patient presents with  . Generalized Body Aches  . Fever     (Consider location/radiation/quality/duration/timing/severity/associated sxs/prior Treatment) HPI Comments: Patient is a 59 year old male with past medical history of COPD, metastatic cancer. He presents for evaluation of fever, chills, lower chest discomfort, and productive cough for the past 2 days. He was seen by his primary Dr. and sent here for evaluation of possible pneumonia.  Patient is a 59 y.o. male presenting with fever. The history is provided by the patient.  Fever Temp source:  Subjective Severity:  Moderate Onset quality:  Sudden Duration:  2 days Timing:  Constant Progression:  Worsening Chronicity:  New Relieved by:  Nothing Worsened by:  Nothing tried Ineffective treatments:  None tried Associated symptoms: chills, congestion and cough   Associated symptoms: no chest pain     Past Medical History  Diagnosis Date  . COPD (chronic obstructive pulmonary disease)   . Sigmoid diverticulosis   . DDD (degenerative disc disease)   . Anxiety   . Depression   . Psoriasis   . Tubular adenoma     Colonoscopy 1/12  . Prostate cancer     Metastatic, stage IV  - [name]  . Internal hemorrhoids     Colonoscopy 1/12  . Drug reaction     Pamidronate - induced skin toxicity, Zometa - induced toxicity  . GERD (gastroesophageal reflux disease)   . Gastritis   . Prostate ca 12/29/2010  . Osteoarthritis   . 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 Coronary artery disease     DES to LAD 1/12, has 2 stents  . Myocardial infarction     2012  . Hypertension   . Asthma   . Horseshoe kidney     Single kidney   Past Surgical History  Procedure Laterality Date  . Anterior fusion cervical spine    . Posterior fusion cervical spine    . Left foot surgery    .  Prostatectomy  02/28/2006    [name] in [place], [place].  Marland Kitchen Appendectomy    . Heart stents  06/2010    2  . Ethmoidectomy Left 09/28/2013    Procedure: LEFT ENDOSCOPIC ANTERIOR ETHMOIDECTOMY;  Surgeon: [name], MD;  Location: [place];  Service: ENT;  Laterality: Left;  . Maxillary antrostomy Left 09/28/2013    Procedure: LEFT ENDOSCOPIC MAXILLARY ANTROSTOMY WITH REMOVAL OF TISSUE;  Surgeon: [name], MD;  Location: [place];  Service: ENT;  Laterality: Left;   Family History  Problem Relation Age of Onset  . Alzheimer's disease Mother   . Lung cancer Father   . Heart attack Brother    History  Substance Use Topics  . Smoking status: Former Smoker -- 1.00 packs/day for 15 years    Types: Cigarettes    Quit date: 02/20/2006  . Smokeless tobacco: Never Used  . Alcohol Use: No     Comment: Quit in 2007. Previous 12 pack of beer per week.    Review of Systems  Constitutional: Positive for fever and chills.  HENT: Positive for congestion.   Respiratory: Positive for cough.   Cardiovascular: Negative for chest pain.  All other systems reviewed and are negative.     Allergies  Other; Zoledronic acid; and Pamidronate  Home Medications  Prior to Admission medications   Medication Sig Start Date End Date Taking? Authorizing Provider  ALPRAZolam Duanne Moron) 0.5 MG tablet Take 1 tablet (0.5 mg total) by mouth 4 (four) times daily as needed for anxiety. 01/25/14   Baird Cancer, PA-C  aspirin 81 MG EC tablet Take 81 mg by mouth.    Historical Provider, MD  calcipotriene (DOVONOX) 0.005 % cream Apply topically 2 (two) times daily.     Historical Provider, MD  calcipotriene-betamethasone (TACLONEX) ointment Apply 1 application topically daily. 08/20/12   Mikey Kirschner, MD  Calcium Carbonate-Vitamin D (CALTRATE 600+D PO) Take 1 tablet by mouth daily.     Historical Provider, MD  CAPEX 0.01 % SHAM Apply 1 application topically as needed.  08/01/10    Historical Provider, MD  Clobetasol Propionate 0.05 % shampoo USE AS NEEDED 03/25/14   Mikey Kirschner, MD  CLOBEX SPRAY 0.05 % external spray APPLY TO AFFECTED AREA UP TO 2 TIMES DAILY AS NEEDED 07/16/14   Mikey Kirschner, MD  Elastic Bandages & Supports (LUMBAR BACK BRACE/SUPPORT PAD) MISC 1 each by Does not apply route as directed. 03/31/12   Everardo All, MD  gabapentin (NEURONTIN) 300 MG capsule TAKE 3 CAPSULES AT BEDTIME    Manon Hilding Kefalas, PA-C  leuprolide, 6 Month, (ELIGARD) 45 MG injection Inject 45 mg into the skin. 01/04/14   Historical Provider, MD  meclizine (ANTIVERT) 25 MG tablet Take 25 mg by mouth 3 (three) times daily as needed.    Historical Provider, MD  metoprolol succinate (TOPROL-XL) 25 MG 24 hr tablet take 1 tablet by mouth once daily 08/10/14   Mikey Kirschner, MD  nitroGLYCERIN (NITROSTAT) 0.4 MG SL tablet Place 1 tablet (0.4 mg total) under the tongue every 5 (five) minutes as needed for chest pain. 05/20/13   Imogene Burn, PA-C  Oxycodone HCl 10 MG TABS Take 1-2 tablets (10-20 mg total) by mouth every 4 (four) hours as needed. 11/30/13   Baird Cancer, PA-C  OxyCODONE HCl ER 60 MG T12A Take 60 mg by mouth every 8 (eight) hours. 11/30/13   Baird Cancer, PA-C  pantoprazole (PROTONIX) 40 MG tablet take 1 tablet by mouth twice a day 04/19/14   Mikey Kirschner, MD  pravastatin (PRAVACHOL) 80 MG tablet take 1 tablet by mouth once daily 08/10/14   Mikey Kirschner, MD  [facility] HFA 108 [phone] BASE) MCG/ACT inhaler inhale 1 puff every 4 hours WITH AEROCHAMBER 03/25/14   Mikey Kirschner, MD  TACLONEX external suspension APPLY TO THE AFFECTED AREA ONCE DAILY 06/06/13   Mikey Kirschner, MD   BP 122/85 mmHg  Pulse 86  Temp(Src) 99.1 F (37.3 C) (Oral)  Resp 18  Ht 5\' 9"  (1.753 m)  Wt 160 lb (72.576 kg)  BMI 23.62 kg/m2  SpO2 100% Physical Exam  Constitutional: He is oriented to person, place, and time. He appears well-developed and well-nourished. No distress.  HENT:   Head: Normocephalic and atraumatic.  Mouth/Throat: Oropharynx is clear and moist.  Neck: Normal range of motion. Neck supple.  Cardiovascular: Normal rate, regular rhythm and normal heart sounds.   No murmur heard. Pulmonary/Chest: Effort normal. No respiratory distress. He has wheezes. He has no rales.  There are expiratory wheezes bilaterally.  Abdominal: Soft. Bowel sounds are normal. He exhibits no distension. There is no tenderness.  Musculoskeletal: Normal range of motion. He exhibits no edema.  Lymphadenopathy:    He has no cervical adenopathy.  Neurological: He  is alert and oriented to person, place, and time.  Skin: Skin is warm and dry. He is not diaphoretic.  Nursing note and vitals reviewed.   ED Course  Procedures (including critical care time) Labs Review Labs Reviewed  CBC WITH DIFFERENTIAL/PLATELET - Abnormal; Notable for the following:    RBC 3.92 (*)    Hemoglobin 11.4 (*)    HCT 34.3 (*)    Monocytes Relative 13 (*)    All other components within normal limits  BASIC METABOLIC PANEL - Abnormal; Notable for the following:    Sodium 134 (*)    Potassium 3.1 (*)    Glucose, Bld 105 (*)    GFR calc non Af Amer 90 (*)    All other components within normal limits    Imaging Review Dg Chest 2 View  08/16/2014   CLINICAL DATA:  Wheezing fever shortness of breath initial evaluation, chills  EXAM: CHEST  2 VIEW  COMPARISON:  12/01/2013  FINDINGS: Hyperinflation consistent with COPD. Heart size and vascular pattern normal. No infiltrate or effusion. Left coronary stent appreciated.  IMPRESSION: Evidence of COPD with no acute findings   Electronically Signed   By: Skipper Cliche M.D.   On: 08/16/2014 17:38     EKG Interpretation None      MDM   Final diagnoses:  None    Chest x-ray does not reveal an obvious pneumonia and laboratory studies are reassuring without a significant white count. Due to his history, he will be treated with antibiotics, steroids,  and cough medication. He is to return as needed if his symptoms worsen or change.    Veryl Speak, MD 08/16/14 2005

## 2014-08-16 NOTE — Progress Notes (Signed)
   Subjective:    Patient ID: ROBEN SCHLIEP, male    DOB: Jan 26, 1956, 59 y.o.   MRN: [id number]  Cough This is a new problem. The current episode started in the past 7 days. Associated symptoms include ear pain, a fever, nasal congestion and wheezing.   Patient started a few days ago body aches headaches congestion cough feels horrible significantly worse over the past 48 hours denies vomiting unable to eat much or drink much also significant wheezing 25 minutes spent with patient between evaluation nebulizer treatment and reevaluation. Patient escorted to his car via wheelchair.  Review of Systems  Constitutional: Positive for fever.  HENT: Positive for ear pain.   Respiratory: Positive for cough and wheezing.        Objective:   Physical Exam Patient looks like he feels bad neck is supple bilateral expiratory wheezes with rales noted worse on the left than the right nebulizer treatment given minimal improvement mild tachycardia. Once again rails noted in the bases skin warm dry       Assessment & Plan:  Influenza Probable pneumonia Needs lab work and x-ray Has multiple chronic health issues which increases risk for severe outcomes. Family agrees to go immediately to the ER ER doctor was communicated with.

## 2014-08-16 NOTE — ED Notes (Signed)
Pt reports sent by [name] for wheezing, fever, sob.  Reports thinks may have flu and pneumonia.  Pt has chills.

## 2014-08-18 ENCOUNTER — Emergency Department (HOSPITAL_COMMUNITY): Payer: Medicare Other

## 2014-08-18 ENCOUNTER — Encounter (HOSPITAL_COMMUNITY): Payer: Self-pay | Admitting: *Deleted

## 2014-08-18 ENCOUNTER — Inpatient Hospital Stay (HOSPITAL_COMMUNITY)
Admission: EM | Admit: 2014-08-18 | Discharge: 2014-08-19 | DRG: 641 | Disposition: A | Discharge status: Home / Self Care | Payer: Medicare Other | Attending: Internal Medicine | Admitting: Internal Medicine

## 2014-08-18 ENCOUNTER — Telehealth: Payer: Self-pay | Admitting: *Deleted

## 2014-08-18 DIAGNOSIS — Z820 Family history of epilepsy and other diseases of the nervous system: Secondary | ICD-10-CM | POA: Diagnosis not present

## 2014-08-18 DIAGNOSIS — J441 Chronic obstructive pulmonary disease with (acute) exacerbation: Secondary | ICD-10-CM | POA: Diagnosis present

## 2014-08-18 DIAGNOSIS — Z87891 Personal history of nicotine dependence: Secondary | ICD-10-CM

## 2014-08-18 DIAGNOSIS — Z7951 Long term (current) use of inhaled steroids: Secondary | ICD-10-CM | POA: Diagnosis not present

## 2014-08-18 DIAGNOSIS — E876 Hypokalemia: Secondary | ICD-10-CM | POA: Diagnosis not present

## 2014-08-18 DIAGNOSIS — Z955 Presence of coronary angioplasty implant and graft: Secondary | ICD-10-CM | POA: Diagnosis not present

## 2014-08-18 DIAGNOSIS — J449 Chronic obstructive pulmonary disease, unspecified: Secondary | ICD-10-CM | POA: Diagnosis present

## 2014-08-18 DIAGNOSIS — C61 Malignant neoplasm of prostate: Secondary | ICD-10-CM | POA: Diagnosis present

## 2014-08-18 DIAGNOSIS — M1990 Unspecified osteoarthritis, unspecified site: Secondary | ICD-10-CM | POA: Diagnosis present

## 2014-08-18 DIAGNOSIS — R197 Diarrhea, unspecified: Secondary | ICD-10-CM | POA: Diagnosis present

## 2014-08-18 DIAGNOSIS — Z79891 Long term (current) use of opiate analgesic: Secondary | ICD-10-CM | POA: Diagnosis not present

## 2014-08-18 DIAGNOSIS — I10 Essential (primary) hypertension: Secondary | ICD-10-CM | POA: Diagnosis present

## 2014-08-18 DIAGNOSIS — I252 Old myocardial infarction: Secondary | ICD-10-CM

## 2014-08-18 DIAGNOSIS — K219 Gastro-esophageal reflux disease without esophagitis: Secondary | ICD-10-CM | POA: Diagnosis present

## 2014-08-18 DIAGNOSIS — Z7952 Long term (current) use of systemic steroids: Secondary | ICD-10-CM

## 2014-08-18 DIAGNOSIS — Z8249 Family history of ischemic heart disease and other diseases of the circulatory system: Secondary | ICD-10-CM | POA: Diagnosis not present

## 2014-08-18 DIAGNOSIS — Z792 Long term (current) use of antibiotics: Secondary | ICD-10-CM

## 2014-08-18 DIAGNOSIS — Z801 Family history of malignant neoplasm of trachea, bronchus and lung: Secondary | ICD-10-CM | POA: Diagnosis not present

## 2014-08-18 DIAGNOSIS — R079 Chest pain, unspecified: Secondary | ICD-10-CM

## 2014-08-18 DIAGNOSIS — J45909 Unspecified asthma, uncomplicated: Secondary | ICD-10-CM | POA: Diagnosis present

## 2014-08-18 DIAGNOSIS — R0789 Other chest pain: Secondary | ICD-10-CM | POA: Diagnosis present

## 2014-08-18 DIAGNOSIS — I2510 Atherosclerotic heart disease of native coronary artery without angina pectoris: Secondary | ICD-10-CM | POA: Diagnosis present

## 2014-08-18 DIAGNOSIS — E782 Mixed hyperlipidemia: Secondary | ICD-10-CM | POA: Diagnosis present

## 2014-08-18 DIAGNOSIS — C772 Secondary and unspecified malignant neoplasm of intra-abdominal lymph nodes: Secondary | ICD-10-CM | POA: Diagnosis present

## 2014-08-18 LAB — CBC WITH DIFFERENTIAL/PLATELET
Basophils Absolute: 0 10*3/uL (ref 0.0–0.1)
Basophils Relative: 1 % (ref 0–1)
Eosinophils Absolute: 0 10*3/uL (ref 0.0–0.7)
Eosinophils Relative: 0 % (ref 0–5)
HCT: 34.3 % — ABNORMAL LOW (ref 39.0–52.0)
Hemoglobin: 11.7 g/dL — ABNORMAL LOW (ref 13.0–17.0)
Lymphocytes Relative: 29 % (ref 12–46)
Lymphs Abs: 1.5 10*3/uL (ref 0.7–4.0)
MCH: 29.4 pg (ref 26.0–34.0)
MCHC: 34.1 g/dL (ref 30.0–36.0)
MCV: 86.2 fL (ref 78.0–100.0)
Monocytes Absolute: 0.9 10*3/uL (ref 0.1–1.0)
Monocytes Relative: 16 % — ABNORMAL HIGH (ref 3–12)
Neutro Abs: 2.8 10*3/uL (ref 1.7–7.7)
Neutrophils Relative %: 54 % (ref 43–77)
Platelets: 311 10*3/uL (ref 150–400)
RBC: 3.98 MIL/uL — ABNORMAL LOW (ref 4.22–5.81)
RDW: 13 % (ref 11.5–15.5)
WBC: 5.2 10*3/uL (ref 4.0–10.5)

## 2014-08-18 LAB — MAGNESIUM: Magnesium: 2.2 mg/dL (ref 1.5–2.5)

## 2014-08-18 LAB — TROPONIN I
Troponin I: <0.03 ng/mL (ref <0.031)
Troponin I: <0.03 ng/mL (ref <0.031)

## 2014-08-18 LAB — BASIC METABOLIC PANEL
Anion gap: 14 (ref 5–15)
BUN: 12 mg/dL (ref 6–23)
CO2: 21 mmol/L (ref 19–32)
Calcium: 8.7 mg/dL (ref 8.4–10.5)
Chloride: 100 mmol/L (ref 96–112)
Creatinine, Ser: 0.93 mg/dL (ref 0.50–1.35)
GFR calc Af Amer: >90 mL/min (ref >90)
GFR calc non Af Amer: 89 mL/min — ABNORMAL LOW (ref >90)
Glucose, Bld: 106 mg/dL — ABNORMAL HIGH (ref 70–99)
Potassium: 2.1 mmol/L — CL (ref 3.5–5.1)
Sodium: 135 mmol/L (ref 135–145)

## 2014-08-18 LAB — MRSA PCR SCREENING: MRSA by PCR: NEGATIVE

## 2014-08-18 MED ORDER — KCL IN DEXTROSE-NACL 40-5-0.9 MEQ/L-%-% IV SOLN
INTRAVENOUS | Status: AC
  Filled 2014-08-18: qty 1000

## 2014-08-18 MED ORDER — HEPARIN SODIUM (PORCINE) 5000 UNIT/ML IJ SOLN
5000.0000 [IU] | Freq: Three times a day (TID) | INTRAMUSCULAR | Status: DC
  Administered 2014-08-18 – 2014-08-19 (×2): 5000 [IU] via SUBCUTANEOUS
  Filled 2014-08-18: qty 1

## 2014-08-18 MED ORDER — NITROGLYCERIN 0.4 MG SL SUBL: 0.4000 mg | SUBLINGUAL_TABLET | SUBLINGUAL | Status: DC | PRN

## 2014-08-18 MED ORDER — POTASSIUM CHLORIDE 10 MEQ/100ML IV SOLN
10.0000 meq | INTRAVENOUS | Status: DC
  Administered 2014-08-18: 10 meq via INTRAVENOUS
  Filled 2014-08-18: qty 100

## 2014-08-18 MED ORDER — ASPIRIN EC 81 MG PO TBEC
81.0000 mg | DELAYED_RELEASE_TABLET | Freq: Every day | ORAL | Status: DC
  Administered 2014-08-19: 81 mg via ORAL
  Filled 2014-08-18: qty 1

## 2014-08-18 MED ORDER — PANTOPRAZOLE SODIUM 40 MG PO TBEC
40.0000 mg | DELAYED_RELEASE_TABLET | Freq: Two times a day (BID) | ORAL | Status: DC
  Administered 2014-08-18 – 2014-08-19 (×2): 40 mg via ORAL
  Filled 2014-08-18 (×2): qty 1

## 2014-08-18 MED ORDER — POTASSIUM CHLORIDE 20 MEQ/15ML (10%) PO SOLN
ORAL | Status: AC
  Administered 2014-08-18: 40 meq via ORAL
  Filled 2014-08-18: qty 30

## 2014-08-18 MED ORDER — POTASSIUM CHLORIDE 10 MEQ/100ML IV SOLN
10.0000 meq | INTRAVENOUS | Status: AC
  Administered 2014-08-18 – 2014-08-19 (×4): 10 meq via INTRAVENOUS
  Filled 2014-08-18 (×2): qty 100

## 2014-08-18 MED ORDER — METOPROLOL SUCCINATE ER 25 MG PO TB24
25.0000 mg | ORAL_TABLET | Freq: Every day | ORAL | Status: DC
  Administered 2014-08-19: 25 mg via ORAL
  Filled 2014-08-18: qty 1

## 2014-08-18 MED ORDER — LORAZEPAM 2 MG/ML IJ SOLN
1.0000 mg | Freq: Once | INTRAMUSCULAR | Status: AC
  Administered 2014-08-18: 1 mg via INTRAVENOUS

## 2014-08-18 MED ORDER — CALCIPOTRIENE 0.005 % EX CREA
TOPICAL_CREAM | Freq: Two times a day (BID) | CUTANEOUS | Status: DC
  Filled 2014-08-18: qty 120

## 2014-08-18 MED ORDER — KCL IN DEXTROSE-NACL 40-5-0.9 MEQ/L-%-% IV SOLN
INTRAVENOUS | Status: DC
  Administered 2014-08-18: 23:00:00 via INTRAVENOUS
  Filled 2014-08-18 (×3): qty 1000

## 2014-08-18 MED ORDER — POTASSIUM CHLORIDE CRYS ER 20 MEQ PO TBCR
40.0000 meq | EXTENDED_RELEASE_TABLET | Freq: Every day | ORAL | Status: DC
  Administered 2014-08-19: 40 meq via ORAL
  Filled 2014-08-18: qty 2

## 2014-08-18 MED ORDER — ALPRAZOLAM 0.5 MG PO TABS: 0.5000 mg | ORAL_TABLET | Freq: Four times a day (QID) | ORAL | Status: DC | PRN

## 2014-08-18 MED ORDER — ASPIRIN 325 MG PO TABS
325.0000 mg | ORAL_TABLET | Freq: Once | ORAL | Status: AC
  Administered 2014-08-18: 325 mg via ORAL
  Filled 2014-08-18: qty 1

## 2014-08-18 MED ORDER — DEXTROSE 5 % IV SOLN
INTRAVENOUS | Status: AC
  Filled 2014-08-18: qty 500

## 2014-08-18 MED ORDER — SODIUM CHLORIDE 0.9 % IJ SOLN
3.0000 mL | Freq: Two times a day (BID) | INTRAMUSCULAR | Status: DC
  Administered 2014-08-18: 3 mL via INTRAVENOUS

## 2014-08-18 MED ORDER — OXYCODONE HCL 5 MG PO TABS
10.0000 mg | ORAL_TABLET | Freq: Three times a day (TID) | ORAL | Status: DC | PRN
  Administered 2014-08-18: 10 mg via ORAL

## 2014-08-18 MED ORDER — PRAVASTATIN SODIUM 40 MG PO TABS
80.0000 mg | ORAL_TABLET | Freq: Every day | ORAL | Status: DC
  Administered 2014-08-19: 80 mg via ORAL
  Filled 2014-08-18: qty 2

## 2014-08-18 MED ORDER — DEXTROSE 5 % IV SOLN
500.0000 mg | INTRAVENOUS | Status: DC
  Administered 2014-08-18: 500 mg via INTRAVENOUS
  Filled 2014-08-18 (×2): qty 500

## 2014-08-18 MED ORDER — FLUTICASONE PROPIONATE 50 MCG/ACT NA SUSP
1.0000 | Freq: Every day | NASAL | Status: DC
  Administered 2014-08-19: 1 via NASAL
  Filled 2014-08-18: qty 16

## 2014-08-18 MED ORDER — IPRATROPIUM-ALBUTEROL 0.5-2.5 (3) MG/3ML IN SOLN
3.0000 mL | Freq: Once | RESPIRATORY_TRACT | Status: AC
  Administered 2014-08-18: 3 mL via RESPIRATORY_TRACT
  Filled 2014-08-18: qty 3

## 2014-08-18 MED ORDER — GABAPENTIN 300 MG PO CAPS
300.0000 mg | ORAL_CAPSULE | Freq: Every day | ORAL | Status: DC
  Administered 2014-08-18: 300 mg via ORAL
  Filled 2014-08-18: qty 1

## 2014-08-18 MED ORDER — OXYCODONE HCL ER 20 MG PO T12A
60.0000 mg | EXTENDED_RELEASE_TABLET | Freq: Three times a day (TID) | ORAL | Status: DC
  Administered 2014-08-18 – 2014-08-19 (×3): 60 mg via ORAL
  Filled 2014-08-18 (×3): qty 3

## 2014-08-18 MED ORDER — METHYLPREDNISOLONE SODIUM SUCC 40 MG IJ SOLR
40.0000 mg | Freq: Four times a day (QID) | INTRAMUSCULAR | Status: DC
Start: 2014-08-18 — End: 2014-08-19
  Administered 2014-08-18 – 2014-08-19 (×3): 40 mg via INTRAVENOUS
  Filled 2014-08-18 (×3): qty 1

## 2014-08-18 MED ORDER — OXYCODONE HCL ER 10 MG PO T12A
EXTENDED_RELEASE_TABLET | ORAL | Status: AC
  Filled 2014-08-18: qty 1

## 2014-08-18 MED ORDER — POTASSIUM CHLORIDE 20 MEQ/15ML (10%) PO SOLN
40.0000 meq | Freq: Once | ORAL | Status: AC
  Administered 2014-08-18: 40 meq via ORAL

## 2014-08-18 MED ORDER — IPRATROPIUM-ALBUTEROL 0.5-2.5 (3) MG/3ML IN SOLN
3.0000 mL | RESPIRATORY_TRACT | Status: DC
  Administered 2014-08-18 – 2014-08-19 (×4): 3 mL via RESPIRATORY_TRACT
  Filled 2014-08-18 (×4): qty 3

## 2014-08-18 MED ORDER — KCL IN DEXTROSE-NACL 40-5-0.45 MEQ/L-%-% IV SOLN
INTRAVENOUS | Status: AC
  Filled 2014-08-18: qty 1000

## 2014-08-18 MED ORDER — LORAZEPAM 2 MG/ML IJ SOLN
INTRAMUSCULAR | Status: AC
  Administered 2014-08-18: 1 mg via INTRAVENOUS
  Filled 2014-08-18: qty 1

## 2014-08-18 NOTE — ED Notes (Signed)
Patient to room with triage nurse. Patient hyperventilating. When RN attempted to coach patient's breathing, patient shook his head no and refused to attempt to control breaths. Patient then began shaking and coughing. Coughed up green sputum after several coughs. Not cooperative with RN exam. Oxygen sats 100% on RA. Expiratory wheezing noted.

## 2014-08-18 NOTE — ED Notes (Signed)
Pt c/o burning in IV site following starting of potassium infusion.  Rate decreased, site flushed.  Site benign.

## 2014-08-18 NOTE — ED Notes (Addendum)
Chest pain, sob all day , seen here 3/14 with dx  Of bronchitis per family.  Breathing loudly, MM's dry.  Pt is taking a Z pack  Pt would not sit up in w/c, started sliding into the floor at triage

## 2014-08-18 NOTE — H&P (Signed)
[facility] History and Physical  [name] [name] LNL:[id number] DOB: January 15, 1956    PCP:   [name], MD   Chief Complaint: chest pain with coughing.  Found to have profound hypokalemia at 2.1 mEq per L.   HPI: [name] [name] is an 59 y.o. male with hx of known CAD, s/p prior cardiac stents a few years ago, hx of metastatic prostate CA, on chronic narcotics, COPD, HTN, asthma, GERD, Dx with bronchitis by his PCP and started on Zpak, presented tonight to the ER with chest pain, retrosternally, with coughs.   He has been coughing phlegm, along with having SOB and audible wheezing.  He had diarrhea for 2-3 days, but denied distant travel, ill contact, abdominal cramps, nausea, vomiting, fever, or chills.  He was also started on Prednisone at low dose for his COPD exacerbation.  Evalution in the ER showed a K of 2.1, no leukocytosis, and a CXR only showed COPD, no PNA.  His initial troponin was negative.  He was started on IV KCL, and hospitalist was asked to admit him for profound hypokalemia, and chest pain r/out.   Rewiew of Systems:  Constitutional: Negative for malaise, fever and chills. No significant weight loss or weight gain Eyes: Negative for eye pain, redness and discharge, diplopia, visual changes, or flashes of light. ENMT: Negative for ear pain, hoarseness, nasal congestion, sinus pressure and sore throat. No headaches; tinnitus, drooling, or problem swallowing. Cardiovascular: Negative for palpitations, diaphoresis,  and peripheral edema. ; No orthopnea, PND Respiratory: Negative for cough, hemoptysis, wheezing and stridor. No pleuritic chestpain. Gastrointestinal: Negative for nausea, vomiting, diarrhea, constipation, abdominal pain, melena, blood in stool, hematemesis, jaundice and rectal bleeding.    Genitourinary: Negative for frequency, dysuria, incontinence,flank pain and hematuria; Musculoskeletal: Negative for back pain and neck pain. Negative for swelling and trauma.;   Skin: . Negative for pruritus, rash, abrasions, bruising and skin lesion.; ulcerations Neuro: Negative for headache, lightheadedness and neck stiffness. Negative for weakness, altered level of consciousness , altered mental status, extremity weakness, burning feet, involuntary movement, seizure and syncope.  Psych: negative for anxiety, depression, insomnia, tearfulness, panic attacks, hallucinations, paranoia, suicidal or homicidal ideation    Past Medical History  Diagnosis Date  . COPD (chronic obstructive pulmonary disease)   . Sigmoid diverticulosis   . DDD (degenerative disc disease)   . Anxiety   . Depression   . Psoriasis   . Tubular adenoma     Colonoscopy 1/12  . Prostate cancer     Metastatic, stage IV  - [name]  . Internal hemorrhoids     Colonoscopy 1/12  . Drug reaction     Pamidronate - induced skin toxicity, Zometa - induced toxicity  . GERD (gastroesophageal reflux disease)   . Gastritis   . Prostate ca 12/29/2010  . Osteoarthritis   . 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 Coronary artery disease     DES to LAD 1/12, has 2 stents  . Myocardial infarction     2012  . Hypertension   . Asthma   . Horseshoe kidney     Single kidney    Past Surgical History  Procedure Laterality Date  . Anterior fusion cervical spine    . Posterior fusion cervical spine    . Left foot surgery    . Prostatectomy  02/28/2006    [name] in [place], [place].  Marland Kitchen Appendectomy    . Heart stents  06/2010    2  .  Ethmoidectomy Left 09/28/2013    Procedure: LEFT ENDOSCOPIC ANTERIOR ETHMOIDECTOMY;  Surgeon: [name], MD;  Location: [place];  Service: ENT;  Laterality: Left;  . Maxillary antrostomy Left 09/28/2013    Procedure: LEFT ENDOSCOPIC MAXILLARY ANTROSTOMY WITH REMOVAL OF TISSUE;  Surgeon: [name], MD;  Location: [place];  Service: ENT;  Laterality: Left;    Medications:  HOME MEDS: Prior to Admission medications   Medication  Sig Start Date End Date Taking? Authorizing Provider  ALPRAZolam Duanne Moron) 0.5 MG tablet Take 1 tablet (0.5 mg total) by mouth 4 (four) times daily as needed for anxiety. 01/25/14  Yes Manon Hilding Kefalas, PA-C  azithromycin (ZITHROMAX Z-PAK) 250 MG tablet 2 po day one, then 1 daily x 4 days 08/16/14  Yes Veryl Speak, MD  calcipotriene (DOVONOX) 0.005 % cream Apply topically 2 (two) times daily.    Yes Historical Provider, MD  calcipotriene-betamethasone (TACLONEX) ointment Apply 1 application topically daily. 08/20/12  Yes Mikey Kirschner, MD  CAPEX 0.01 % SHAM Apply 1 application topically as needed (psoriasis).  08/01/10  Yes Historical Provider, MD  Clobetasol Propionate 0.05 % shampoo USE AS NEEDED 03/25/14  Yes Mikey Kirschner, MD  CLOBEX SPRAY 0.05 % external spray APPLY TO AFFECTED AREA UP TO 2 TIMES DAILY AS NEEDED 07/16/14  Yes Mikey Kirschner, MD  gabapentin (NEURONTIN) 300 MG capsule TAKE 3 CAPSULES AT BEDTIME   Yes Manon Hilding Kefalas, PA-C  leuprolide, 6 Month, (ELIGARD) 45 MG injection Inject 45 mg into the skin every 6 (six) months.  01/04/14  Yes Historical Provider, MD  meclizine (ANTIVERT) 25 MG tablet Take 25 mg by mouth 3 (three) times daily as needed for dizziness.    Yes Historical Provider, MD  metoprolol succinate (TOPROL-XL) 25 MG 24 hr tablet take 1 tablet by mouth once daily 08/10/14  Yes Mikey Kirschner, MD  nitroGLYCERIN (NITROSTAT) 0.4 MG SL tablet Place 1 tablet (0.4 mg total) under the tongue every 5 (five) minutes as needed for chest pain. 05/20/13  Yes Imogene Burn, PA-C  Oxycodone HCl 10 MG TABS Take 1-2 tablets (10-20 mg total) by mouth every 4 (four) hours as needed. 11/30/13  Yes Manon Hilding Kefalas, PA-C  OxyCODONE HCl ER 60 MG T12A Take 60 mg by mouth every 8 (eight) hours. 11/30/13  Yes Baird Cancer, PA-C  pantoprazole (PROTONIX) 40 MG tablet take 1 tablet by mouth twice a day 04/19/14  Yes Mikey Kirschner, MD  pravastatin (PRAVACHOL) 80 MG tablet take 1 tablet by  mouth once daily 08/10/14  Yes Mikey Kirschner, MD  predniSONE (DELTASONE) 10 MG tablet Take 2 tablets (20 mg total) by mouth 2 (two) times daily. 08/16/14  Yes Veryl Speak, MD  PROAIR HFA 108 [phone] BASE) MCG/ACT inhaler inhale 1 puff every 4 hours WITH AEROCHAMBER 03/25/14  Yes Mikey Kirschner, MD  chlorpheniramine-HYDROcodone [facility] PENNKINETIC ER) 10-8 MG/5ML LQCR Take 5 mLs by mouth every 12 (twelve) hours as needed for cough. Patient not taking: Reported on 08/18/2014 08/16/14   Veryl Speak, MD  Elastic Bandages & Supports (LUMBAR BACK BRACE/SUPPORT PAD) MISC 1 each by Does not apply route as directed. 03/31/12   Everardo All, MD  fluticasone (FLONASE) 50 MCG/ACT nasal spray Place 1 spray into both nostrils daily. 07/29/14   Historical Provider, MD  TACLONEX external suspension APPLY TO THE AFFECTED AREA ONCE DAILY Patient not taking: Reported on 08/18/2014 06/06/13   Mikey Kirschner, MD  Allergies:  Allergies  Allergen Reactions  . Other Other (See Comments)    Severe skin tearing with tape!  . Zoledronic Acid     Other reaction(s): Unknown zometa induced toxicity  ELEVATED BP, DIAPHORETIC & DIZZINESS  . Pamidronate Other (See Comments) and Rash    Induced skin toxicity with chemical like burn Skin toxicity    Social History:   reports that he quit smoking about 8 years ago. His smoking use included Cigarettes. He has a 15 pack-year smoking history. He has never used smokeless tobacco. He reports that he does not drink alcohol or use illicit drugs.  Family History: Family History  Problem Relation Age of Onset  . Alzheimer's disease Mother   . Lung cancer Father   . Heart attack Brother      Physical Exam: Filed Vitals:   08/18/14 1830 08/18/14 1900 08/18/14 1930 08/18/14 2008  BP: 114/79 122/87 120/88 127/74  Pulse: 89 89 78 64  Temp:      TempSrc:      Resp: 24 16 16 25   Weight:      SpO2: 100% 96% 96% 100%   Blood pressure 127/74, pulse 64, temperature  98.3 F (36.8 C), temperature source Oral, resp. rate 25, weight 72.576 kg (160 lb), SpO2 100 %.  GEN:  Pleasant patient lying in the stretcher in no acute distress; cooperative with exam. PSYCH:  alert and oriented x4; does not appear anxious or depressed; affect is appropriate. HEENT: Mucous membranes pink and anicteric; PERRLA; EOM intact; no cervical lymphadenopathy nor thyromegaly or carotid bruit; no JVD; There were no stridor. Neck is very supple. Breasts:: Not examined CHEST WALL: No tenderness CHEST: Normal respiration, with inspiratory and expiratory wheezes, no rales.  HEART: Regular rate and rhythm.  There are no murmur, rub, or gallops.   BACK: No kyphosis or scoliosis; no CVA tenderness ABDOMEN: soft and non-tender; no masses, no organomegaly, normal abdominal bowel sounds; no pannus; no intertriginous candida. There is no rebound and no distention. Rectal Exam: Not done EXTREMITIES: No bone or joint deformity; age-appropriate arthropathy of the hands and knees; no edema; no ulcerations.  There is no calf tenderness. Genitalia: not examined PULSES: 2+ and symmetric SKIN: Normal hydration no rash or ulceration CNS: Cranial nerves 2-12 grossly intact no focal lateralizing neurologic deficit.  Speech is fluent; uvula elevated with phonation, facial symmetry and tongue midline. DTR are normal bilaterally, cerebella exam is intact, barbinski is negative and strengths are equaled bilaterally.  No sensory loss.   Labs on Admission:  Basic Metabolic Panel:  Recent Labs Lab 08/16/14 1739 08/18/14 1701  NA 134* 135  K 3.1* 2.1*  CL 100 100  CO2 24 21  GLUCOSE 105* 106*  BUN 9 12  CREATININE 0.92 0.93  CALCIUM 8.8 8.7   CBC   Recent Labs Lab 08/16/14 1739 08/18/14 1701  WBC 6.6 5.2  NEUTROABS 4.6 2.8  HGB 11.4* 11.7*  HCT 34.3* 34.3*  MCV 87.5 86.2  PLT 256 311   Cardiac Enzymes:  Recent Labs Lab 08/18/14 1701  TROPONINI <0.03   Radiological Exams on  Admission: Dg Chest Portable 1 View  08/18/2014   CLINICAL DATA:  Chest pain and shortness of breath for 1 day  EXAM: PORTABLE CHEST - 1 VIEW  COMPARISON:  August 16, 2014  FINDINGS: There is a degree of underlying emphysematous change. There is no edema or consolidation. Heart size is normal. Pulmonary vascularity is stable and reflects underlying emphysematous change. No adenopathy.  There is postoperative change in the lower cervical spine.  IMPRESSION: Underlying emphysematous change.  No edema or consolidation.   Electronically Signed   By: Lowella Grip III M.D.   On: 08/18/2014 16:43    EKG: Independently reviewed. NSR with no acute ST T changes.    Assessment/Plan Present on Admission:  . Chest pain, atypical:  Will cycle his troponins, continue on ASA and Low dose beta blocker along with statin.  I think his chest pain was due to coughing.   . Hypokalemia:  Likely from diarrhea, and poor eating.  He will need IV along with oral supplements.  WIll check Mag level as well.   . Mixed hyperlipidemia: Continue statin he has been on.  He is on max dose.   . Prostate ca:  Continue with his pain regimen.   . CORONARY ATHEROSCLEROSIS NATIVE CORONARY ARTERY:  Stable.    . Diarrhea:  Stool will be sent.  Check C diff, though I have low suspicion.    Marland Kitchen COPD exacerbation:  Will continue with IV Zithromax, give IV steroid with no bolus, and start neb RTC.   Thank you for allowing me to participate in the care of your patient.   Other plans as per orders.  Code Status: FULL Haskel Khan, MD. [facility] Pager [phone] 7pm to 7am.  08/18/2014, 8:40 PM

## 2014-08-18 NOTE — ED Notes (Signed)
Called to room. Patient anxious and agitated, hyperventilating. Again, patient is not cooperative with staff, unable to be re-directed. Patient is able to talk and asking when MD is going to see him. MD at bedside, but unable to communicate with pt due to hyperventilation/agitation. 1 mg Ativan IV verbal order obtained from Dr Lacinda Axon, along with 40 mEq of Potassium PO obtained.

## 2014-08-18 NOTE — ED Notes (Signed)
MD at bedside. Warm blanket given for comfort.

## 2014-08-18 NOTE — ED Notes (Signed)
Critical lab result received from [facility] in [place] 2.1  Dr Lacinda Axon informed at this time

## 2014-08-18 NOTE — Telephone Encounter (Signed)
Patient called and states that he is SOB and has CP that does not radiate. Patient states that he is home alone and his wife is on the way home. Patient took one Nitro at 54 with no relief. Patient encourged to be evaluated in the ED. Patient stated he was coming to the office and would not go to ED. Patient was then encouraged again to be evaluated. Patient became upset and ended the call.

## 2014-08-18 NOTE — ED Provider Notes (Signed)
CSN: [id number]     Arrival date & time 08/18/14  1553 History   First MD Initiated Contact with Patient 08/18/14 1601     Chief Complaint  Patient presents with  . Chest Pain     (Consider location/radiation/quality/duration/timing/severity/associated sxs/prior Treatment) HPI chest pain described as tightness and sharpness, dyspnea since earlier today. Patient evaluated 2 days ago by primary care doctor and diagnosed with bronchitis. He is currently taking Zithromax. No diaphoresis or nausea. He is status post stents 2 in the past 2-3 years by [facility] cardiology. Severity is moderate. Nothing makes symptoms better or worse.   Past Medical History  Diagnosis Date  . COPD (chronic obstructive pulmonary disease)   . Sigmoid diverticulosis   . DDD (degenerative disc disease)   . Anxiety   . Depression   . Psoriasis   . Tubular adenoma     Colonoscopy 1/12  . Prostate cancer     Metastatic, stage IV  - [name]  . Internal hemorrhoids     Colonoscopy 1/12  . Drug reaction     Pamidronate - induced skin toxicity, Zometa - induced toxicity  . GERD (gastroesophageal reflux disease)   . Gastritis   . Prostate ca 12/29/2010  . Osteoarthritis   . 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 Coronary artery disease     DES to LAD 1/12, has 2 stents  . Myocardial infarction     2012  . Hypertension   . Asthma   . Horseshoe kidney     Single kidney   Past Surgical History  Procedure Laterality Date  . Anterior fusion cervical spine    . Posterior fusion cervical spine    . Left foot surgery    . Prostatectomy  02/28/2006    [name] in [place], [place].  Marland Kitchen Appendectomy    . Heart stents  06/2010    2  . Ethmoidectomy Left 09/28/2013    Procedure: LEFT ENDOSCOPIC ANTERIOR ETHMOIDECTOMY;  Surgeon: [name], MD;  Location: [place];  Service: ENT;  Laterality: Left;  . Maxillary antrostomy Left 09/28/2013    Procedure: LEFT ENDOSCOPIC MAXILLARY ANTROSTOMY WITH REMOVAL  OF TISSUE;  Surgeon: [name], MD;  Location: [place];  Service: ENT;  Laterality: Left;   Family History  Problem Relation Age of Onset  . Alzheimer's disease Mother   . Lung cancer Father   . Heart attack Brother    History  Substance Use Topics  . Smoking status: Former Smoker -- 1.00 packs/day for 15 years    Types: Cigarettes    Quit date: 02/20/2006  . Smokeless tobacco: Never Used  . Alcohol Use: No     Comment: Quit in 2007. Previous 12 pack of beer per week.    Review of Systems  All other systems reviewed and are negative.     Allergies  Other; Zoledronic acid; and Pamidronate  Home Medications   Prior to Admission medications   Medication Sig Start Date End Date Taking? Authorizing Provider  ALPRAZolam Duanne Moron) 0.5 MG tablet Take 1 tablet (0.5 mg total) by mouth 4 (four) times daily as needed for anxiety. 01/25/14  Yes Manon Hilding Kefalas, PA-C  azithromycin (ZITHROMAX Z-PAK) 250 MG tablet 2 po day one, then 1 daily x 4 days 08/16/14  Yes Veryl Speak, MD  calcipotriene (DOVONOX) 0.005 % cream Apply topically 2 (two) times daily.    Yes Historical Provider, MD  calcipotriene-betamethasone (TACLONEX) ointment  Apply 1 application topically daily. 08/20/12  Yes Mikey Kirschner, MD  CAPEX 0.01 % SHAM Apply 1 application topically as needed (psoriasis).  08/01/10  Yes Historical Provider, MD  Clobetasol Propionate 0.05 % shampoo USE AS NEEDED 03/25/14  Yes Mikey Kirschner, MD  CLOBEX SPRAY 0.05 % external spray APPLY TO AFFECTED AREA UP TO 2 TIMES DAILY AS NEEDED 07/16/14  Yes Mikey Kirschner, MD  gabapentin (NEURONTIN) 300 MG capsule TAKE 3 CAPSULES AT BEDTIME   Yes Manon Hilding Kefalas, PA-C  leuprolide, 6 Month, (ELIGARD) 45 MG injection Inject 45 mg into the skin every 6 (six) months.  01/04/14  Yes Historical Provider, MD  meclizine (ANTIVERT) 25 MG tablet Take 25 mg by mouth 3 (three) times daily as needed for dizziness.    Yes Historical Provider, MD   metoprolol succinate (TOPROL-XL) 25 MG 24 hr tablet take 1 tablet by mouth once daily 08/10/14  Yes Mikey Kirschner, MD  nitroGLYCERIN (NITROSTAT) 0.4 MG SL tablet Place 1 tablet (0.4 mg total) under the tongue every 5 (five) minutes as needed for chest pain. 05/20/13  Yes Imogene Burn, PA-C  Oxycodone HCl 10 MG TABS Take 1-2 tablets (10-20 mg total) by mouth every 4 (four) hours as needed. 11/30/13  Yes Manon Hilding Kefalas, PA-C  OxyCODONE HCl ER 60 MG T12A Take 60 mg by mouth every 8 (eight) hours. 11/30/13  Yes Baird Cancer, PA-C  pantoprazole (PROTONIX) 40 MG tablet take 1 tablet by mouth twice a day 04/19/14  Yes Mikey Kirschner, MD  pravastatin (PRAVACHOL) 80 MG tablet take 1 tablet by mouth once daily 08/10/14  Yes Mikey Kirschner, MD  predniSONE (DELTASONE) 10 MG tablet Take 2 tablets (20 mg total) by mouth 2 (two) times daily. 08/16/14  Yes Veryl Speak, MD  PROAIR HFA 108 [phone] BASE) MCG/ACT inhaler inhale 1 puff every 4 hours WITH AEROCHAMBER 03/25/14  Yes Mikey Kirschner, MD  chlorpheniramine-HYDROcodone [facility] PENNKINETIC ER) 10-8 MG/5ML LQCR Take 5 mLs by mouth every 12 (twelve) hours as needed for cough. Patient not taking: Reported on 08/18/2014 08/16/14   Veryl Speak, MD  Elastic Bandages & Supports (LUMBAR BACK BRACE/SUPPORT PAD) MISC 1 each by Does not apply route as directed. 03/31/12   Everardo All, MD  fluticasone (FLONASE) 50 MCG/ACT nasal spray Place 1 spray into both nostrils daily. 07/29/14   Historical Provider, MD  TACLONEX external suspension APPLY TO THE AFFECTED AREA ONCE DAILY Patient not taking: Reported on 08/18/2014 06/06/13   Mikey Kirschner, MD   BP 120/88 mmHg  Pulse 78  Temp(Src) 98.3 F (36.8 C) (Oral)  Resp 16  Wt 160 lb (72.576 kg)  SpO2 96% Physical Exam  Constitutional: He is oriented to person, place, and time.  Then, hyperventilating  HENT:  Head: Normocephalic and atraumatic.  Eyes: Conjunctivae and EOM are normal. Pupils are equal, round,  and reactive to light.  Neck: Normal range of motion. Neck supple.  Cardiovascular: Normal rate and regular rhythm.   Pulmonary/Chest: Effort normal and breath sounds normal.  Abdominal: Soft. Bowel sounds are normal.  Musculoskeletal: Normal range of motion.  Neurological: He is alert and oriented to person, place, and time.  Skin: Skin is warm and dry.  Psychiatric: He has a normal mood and affect. His behavior is normal.  Nursing note and vitals reviewed.   ED Course  Procedures (including critical care time) Labs Review Labs Reviewed  CBC WITH DIFFERENTIAL/PLATELET - Abnormal; Notable for the  following:    RBC 3.98 (*)    Hemoglobin 11.7 (*)    HCT 34.3 (*)    Monocytes Relative 16 (*)    All other components within normal limits  BASIC METABOLIC PANEL - Abnormal; Notable for the following:    Potassium 2.1 (*)    Glucose, Bld 106 (*)    GFR calc non Af Amer 89 (*)    All other components within normal limits  TROPONIN I    Imaging Review Dg Chest Portable 1 View  08/18/2014   CLINICAL DATA:  Chest pain and shortness of breath for 1 day  EXAM: PORTABLE CHEST - 1 VIEW  COMPARISON:  August 16, 2014  FINDINGS: There is a degree of underlying emphysematous change. There is no edema or consolidation. Heart size is normal. Pulmonary vascularity is stable and reflects underlying emphysematous change. No adenopathy. There is postoperative change in the lower cervical spine.  IMPRESSION: Underlying emphysematous change.  No edema or consolidation.   Electronically Signed   By: Lowella Grip III M.D.   On: 08/18/2014 16:43     EKG Interpretation   Date/Time:  Wednesday August 18 2014 16:04:58 EDT Ventricular Rate:  85 PR Interval:  171 QRS Duration: 92 QT Interval:  435 QTC Calculation: 517 R Axis:   1 Text Interpretation:  Sinus rhythm Anteroseptal infarct, age indeterminate  Abnormal T, consider ischemia, diffuse leads Lateral leads are also  involved Prolonged QT  interval Confirmed by Lacinda Axon  MD, Krysten Veronica ([id number]) on  08/18/2014 4:37:50 PM      MDM   Final diagnoses:  Chest pain, unspecified chest pain type  Hypokalemia    Patient complains of chest pain, dyspnea with history of coronary artery disease. Potassium 2.1.   Rx aspirin, oral potassium, IV potassium. Discussed with hospitalist    Nat Christen, MD 08/18/14 [phone]

## 2014-08-19 ENCOUNTER — Encounter (HOSPITAL_COMMUNITY): Payer: Self-pay

## 2014-08-19 LAB — BASIC METABOLIC PANEL
Anion gap: 6 (ref 5–15)
BUN: 8 mg/dL (ref 6–23)
CO2: 25 mmol/L (ref 19–32)
Calcium: 8.4 mg/dL (ref 8.4–10.5)
Chloride: 106 mmol/L (ref 96–112)
Creatinine, Ser: 0.81 mg/dL (ref 0.50–1.35)
GFR calc Af Amer: >90 mL/min (ref >90)
GFR calc non Af Amer: >90 mL/min (ref >90)
Glucose, Bld: 131 mg/dL — ABNORMAL HIGH (ref 70–99)
Potassium: 4 mmol/L (ref 3.5–5.1)
Sodium: 137 mmol/L (ref 135–145)

## 2014-08-19 LAB — TROPONIN I
Troponin I: <0.03 ng/mL (ref <0.031)
Troponin I: <0.03 ng/mL (ref <0.031)

## 2014-08-19 MED ORDER — ASPIRIN 81 MG PO TBEC: 81.0000 mg | DELAYED_RELEASE_TABLET | Freq: Every day | ORAL | Status: DC

## 2014-08-19 MED ORDER — IPRATROPIUM-ALBUTEROL 0.5-2.5 (3) MG/3ML IN SOLN: 3.0000 mL | RESPIRATORY_TRACT | Status: DC | PRN

## 2014-08-19 NOTE — Care Management Utilization Note (Signed)
UR completed 

## 2014-08-19 NOTE — Care Management Note (Signed)
    Page 1 of 1   08/19/2014     2:18:15 PM CARE MANAGEMENT NOTE 08/19/2014  Patient:  [name] [name], [name] [name]   Account Number:  [id number]  Date Initiated:  08/19/2014  Documentation initiated by:  Jolene Provost  Subjective/Objective Assessment:   Pt is from home, lives alone but has family that lives nearby. Pt is independnet at baseline and has no DME's or [place] services. Pt plans to discharge home with self care. No CM needs.     Action/Plan:   Anticipated DC Date:  08/19/2014   Anticipated DC Plan:  HOME/SELF CARE      DC Planning Services  CM consult      Choice offered to / List presented to:             Status of service:  Completed, signed off Medicare Important Message given?   (If response is "NO", the following Medicare IM given date fields will be blank) Date Medicare IM given:   Medicare IM given by:   Date Additional Medicare IM given:   Additional Medicare IM given by:    Discharge Disposition:  HOME/SELF CARE  Per UR Regulation:  Reviewed for med. necessity/level of care/duration of stay  If discussed at [place] of Stay Meetings, dates discussed:    Comments:  08/19/2014 [place], RN, MSN, CM

## 2014-08-19 NOTE — Discharge Summary (Signed)
Physician Discharge Summary  [name] [name] ZOX:[id number] DOB: [birth year] DOA: 08/18/2014  PCP: [name] Battiest, MD  Admit date: 08/18/2014 Discharge date: 08/19/2014  Time spent: 45 minutes  Recommendations for Outpatient Follow-up:  -Will be discharged home today. -Advised to follow up with PCP in 1 week for BMET to recheck K levels.   Discharge Diagnoses:  Principal Problem:   Hypokalemia Active Problems:   CORONARY ATHEROSCLEROSIS NATIVE CORONARY ARTERY   Mixed hyperlipidemia   Prostate ca   Chest pain, atypical   Diarrhea   COPD exacerbation   Discharge Condition: Stable and improved  Filed Weights   08/18/14 1604 08/18/14 2200  Weight: 72.576 kg (160 lb) 68.7 kg (151 lb 7.3 oz)    History of present illness:  [name] [name] is an 59 y.o. male with hx of known CAD, s/p prior cardiac stents a few years ago, hx of metastatic prostate CA, on chronic narcotics, COPD, HTN, asthma, GERD, Dx with bronchitis by his PCP and started on Zpak, presented tonight to the ER with chest pain, retrosternally, with coughs. He has been coughing phlegm, along with having SOB and audible wheezing. He had diarrhea for 2-3 days, but denied distant travel, ill contact, abdominal cramps, nausea, vomiting, fever, or chills. He was also started on Prednisone at low dose for his COPD exacerbation. Evalution in the ER showed a K of 2.1, no leukocytosis, and a CXR only showed COPD, no PNA. His initial troponin was negative. He was started on IV KCL, and hospitalist was asked to admit him for profound hypokalemia, and chest pain r/out.   Hospital Course:   Chest Pain -Atypical. -Ruled out for ACS. -Appears MSK from coughing.  Hypokalemia -Repleted, likely from diarrhea and poor PO intake.  Diarrhea -Unable to produce stool sample while in the hospital.  Prostate Cancer -F/u with oncology as an OP.  Procedures:  None   Consultations:  None  Discharge Instructions  Discharge  Instructions    Diet - low sodium heart healthy    Complete by:  As directed      Increase activity slowly    Complete by:  As directed             Medication List    STOP taking these medications        azithromycin 250 MG tablet  Commonly known as:  ZITHROMAX Z-PAK     chlorpheniramine-HYDROcodone 10-8 MG/5ML Lqcr  Commonly known as:  TUSSIONEX PENNKINETIC ER      TAKE these medications        ALPRAZolam 0.5 MG tablet  Commonly known as:  XANAX  Take 1 tablet (0.5 mg total) by mouth 4 (four) times daily as needed for anxiety.     aspirin 81 MG EC tablet  Take 1 tablet (81 mg total) by mouth daily.     calcipotriene 0.005 % cream  Commonly known as:  DOVONOX  Apply topically 2 (two) times daily.     calcipotriene-betamethasone ointment  Commonly known as:  TACLONEX  Apply 1 application topically daily.     CAPEX 0.01 % Sham  Generic drug:  Fluocinolone Acetonide  Apply 1 application topically as needed (psoriasis).     Clobetasol Propionate 0.05 % shampoo  USE AS NEEDED     CLOBEX SPRAY 0.05 % external spray  Generic drug:  Clobetasol Propionate  APPLY TO AFFECTED AREA UP TO 2 TIMES DAILY AS NEEDED     fluticasone 50 MCG/ACT nasal spray  Commonly known  as:  FLONASE  Place 1 spray into both nostrils daily.     gabapentin 300 MG capsule  Commonly known as:  NEURONTIN  TAKE 3 CAPSULES AT BEDTIME     leuprolide (6 Month) 45 MG injection  Commonly known as:  ELIGARD  Inject 45 mg into the skin every 6 (six) months.     Lumbar Back Brace/Support Pad Misc  1 each by Does not apply route as directed.     meclizine 25 MG tablet  Commonly known as:  ANTIVERT  Take 25 mg by mouth 3 (three) times daily as needed for dizziness.     metoprolol succinate 25 MG 24 hr tablet  Commonly known as:  TOPROL-XL  take 1 tablet by mouth once daily     nitroGLYCERIN 0.4 MG SL tablet  Commonly known as:  NITROSTAT  Place 1 tablet (0.4 mg total) under the tongue every 5  (five) minutes as needed for chest pain.     Oxycodone HCl 10 MG Tabs  Take 1-2 tablets (10-20 mg total) by mouth every 4 (four) hours as needed.     OxyCODONE HCl ER 60 MG T12a  Take 60 mg by mouth every 8 (eight) hours.     pantoprazole 40 MG tablet  Commonly known as:  PROTONIX  take 1 tablet by mouth twice a day     pravastatin 80 MG tablet  Commonly known as:  PRAVACHOL  take 1 tablet by mouth once daily     predniSONE 10 MG tablet  Commonly known as:  DELTASONE  Take 2 tablets (20 mg total) by mouth 2 (two) times daily.     PROAIR HFA 108 (90 BASE) MCG/ACT inhaler  Generic drug:  albuterol  inhale 1 puff every 4 hours WITH AEROCHAMBER       Allergies  Allergen Reactions  . Other Other (See Comments)    Severe skin tearing with tape!  . Zoledronic Acid     Other reaction(s): Unknown zometa induced toxicity  ELEVATED BP, DIAPHORETIC & DIZZINESS  . Pamidronate Other (See Comments) and Rash    Induced skin toxicity with chemical like burn Skin toxicity       Follow-up Information    Follow up with [name] Battiest, MD On 08/26/2014.   Specialty:  Family Medicine   Why:  At 10;00am for Hospital Follow up   Contact information:   [place] [address] [phone]        The results of significant diagnostics from this hospitalization (including imaging, microbiology, ancillary and laboratory) are listed below for reference.    Significant Diagnostic Studies: Dg Chest 2 View  08/16/2014   CLINICAL DATA:  Wheezing fever shortness of breath initial evaluation, chills  EXAM: CHEST  2 VIEW  COMPARISON:  12/01/2013  FINDINGS: Hyperinflation consistent with COPD. Heart size and vascular pattern normal. No infiltrate or effusion. Left coronary stent appreciated.  IMPRESSION: Evidence of COPD with no acute findings   Electronically Signed   By: Skipper Cliche M.D.   On: 08/16/2014 17:38   Dg Chest Portable 1 View  08/18/2014   CLINICAL DATA:   Chest pain and shortness of breath for 1 day  EXAM: PORTABLE CHEST - 1 VIEW  COMPARISON:  August 16, 2014  FINDINGS: There is a degree of underlying emphysematous change. There is no edema or consolidation. Heart size is normal. Pulmonary vascularity is stable and reflects underlying emphysematous change. No adenopathy. There is postoperative change in the  lower cervical spine.  IMPRESSION: Underlying emphysematous change.  No edema or consolidation.   Electronically Signed   By: Lowella Grip III M.D.   On: 08/18/2014 16:43    Microbiology: Recent Results (from the past 240 hour(s))  MRSA PCR Screening     Status: None   Collection Time: 08/18/14  9:50 PM  Result Value Ref Range Status   MRSA by PCR NEGATIVE NEGATIVE Final    Comment:        The GeneXpert MRSA Assay (FDA approved for NASAL specimens only), is one component of a comprehensive MRSA colonization surveillance program. It is not intended to diagnose MRSA infection nor to guide or monitor treatment for MRSA infections.      Labs: Basic Metabolic Panel:  Recent Labs Lab 08/16/14 1739 08/18/14 1701 08/18/14 2236 08/19/14 0428  NA 134* 135  --  137  K 3.1* 2.1*  --  4.0  CL 100 100  --  106  CO2 24 21  --  25  GLUCOSE 105* 106*  --  131*  BUN 9 12  --  8  CREATININE 0.92 0.93  --  0.81  CALCIUM 8.8 8.7  --  8.4  MG  --   --  2.2  --    Liver Function Tests: No results for input(s): AST, ALT, ALKPHOS, BILITOT, PROT, ALBUMIN in the last 168 hours. No results for input(s): LIPASE, AMYLASE in the last 168 hours. No results for input(s): AMMONIA in the last 168 hours. CBC:  Recent Labs Lab 08/16/14 1739 08/18/14 1701  WBC 6.6 5.2  NEUTROABS 4.6 2.8  HGB 11.4* 11.7*  HCT 34.3* 34.3*  MCV 87.5 86.2  PLT 256 311   Cardiac Enzymes:  Recent Labs Lab 08/18/14 1701 08/18/14 2236 08/19/14 0428 08/19/14 1000  TROPONINI <0.03 <0.03 <0.03 <0.03   BNP: BNP (last 3 results) No results for input(s): BNP  in the last 8760 hours.  ProBNP (last 3 results) No results for input(s): PROBNP in the last 8760 hours.  CBG: No results for input(s): GLUCAP in the last 168 hours.     SignedLelon Frohlich  [facility] Pager: [phone] 08/19/2014, 6:06 PM

## 2014-08-19 NOTE — Progress Notes (Signed)
Discharge instructions and prescriptions given, verbalized understanding, out in stable condition via w/c with staff. 

## 2014-08-26 ENCOUNTER — Ambulatory Visit: Payer: Medicare Other | Admitting: Family Medicine

## 2014-08-30 ENCOUNTER — Ambulatory Visit: Payer: Medicaid Other | Admitting: Family Medicine

## 2014-09-01 ENCOUNTER — Other Ambulatory Visit: Payer: Self-pay | Admitting: Family Medicine

## 2014-09-02 ENCOUNTER — Encounter: Payer: Self-pay | Admitting: Family Medicine

## 2014-09-02 ENCOUNTER — Ambulatory Visit (INDEPENDENT_AMBULATORY_CARE_PROVIDER_SITE_OTHER): Payer: Medicare Other | Admitting: Family Medicine

## 2014-09-02 VITALS — BP 120/82 | Ht 69.0 in | Wt 171.0 lb

## 2014-09-02 DIAGNOSIS — E785 Hyperlipidemia, unspecified: Secondary | ICD-10-CM

## 2014-09-02 DIAGNOSIS — Z79899 Other long term (current) drug therapy: Secondary | ICD-10-CM

## 2014-09-02 DIAGNOSIS — E782 Mixed hyperlipidemia: Secondary | ICD-10-CM

## 2014-09-02 DIAGNOSIS — C61 Malignant neoplasm of prostate: Secondary | ICD-10-CM

## 2014-09-02 DIAGNOSIS — F419 Anxiety disorder, unspecified: Secondary | ICD-10-CM | POA: Diagnosis not present

## 2014-09-02 DIAGNOSIS — M8710 Osteonecrosis due to drugs, unspecified bone: Secondary | ICD-10-CM | POA: Diagnosis not present

## 2014-09-02 DIAGNOSIS — I2510 Atherosclerotic heart disease of native coronary artery without angina pectoris: Secondary | ICD-10-CM

## 2014-09-02 DIAGNOSIS — K625 Hemorrhage of anus and rectum: Secondary | ICD-10-CM | POA: Diagnosis not present

## 2014-09-02 MED ORDER — PRAVASTATIN SODIUM 80 MG PO TABS: 80.0000 mg | ORAL_TABLET | Freq: Every day | ORAL | Status: DC

## 2014-09-02 MED ORDER — ALPRAZOLAM 0.5 MG PO TABS: 0.5000 mg | ORAL_TABLET | Freq: Four times a day (QID) | ORAL | Status: DC | PRN

## 2014-09-02 NOTE — Progress Notes (Signed)
   Subjective:    Patient ID: [name] [name], male    DOB: July 15, 1955, 59 y.o.   MRN: [id number]  HPI  Patient arrives for a follow up on chronic meds. Patient also following up after recent ER visit and hospitalization. Patient also states his oncologist stated he needs a colonoscopy due to rectal bleeding.  Patient states he sees occasional blood in his stool. States it's been "a long time" since colonoscopy. Only known that it was done in [place].  Saw oncologist who recommended we have him set up for colonoscopy.  Patient claims compliance with lipid medicine. Trying cut down fat intake. Walking some.  Compliant with blood pressure medicine. Has cut down salt intake. Blood pressures generally good when checked elsewhere  Review of Systems No current headache chest pain back pain or abdominal pain no rash    Objective:   Physical Exam  Alert vitals stable blood pressure good on repeat HEENT normal lungs clear. Heart regular in rhythm. Abdomen no tenderness    Assessment & Plan:  Impression #1 hypertension good control #2 hyperlipidemia status uncertain #3 blood per stools history of enough to press on with colonoscopy plan GI referral. Appropriate blood work. Medications refilled. Check every 6 months. WSL

## 2014-09-06 ENCOUNTER — Encounter: Payer: Self-pay | Admitting: Gastroenterology

## 2014-09-09 ENCOUNTER — Ambulatory Visit (INDEPENDENT_AMBULATORY_CARE_PROVIDER_SITE_OTHER): Payer: Self-pay | Admitting: Otolaryngology

## 2014-10-15 ENCOUNTER — Other Ambulatory Visit: Payer: Self-pay | Admitting: Family Medicine

## 2014-10-25 ENCOUNTER — Telehealth: Payer: Self-pay | Admitting: Family Medicine

## 2014-10-25 MED ORDER — PANTOPRAZOLE SODIUM 40 MG PO TBEC: 40.0000 mg | DELAYED_RELEASE_TABLET | Freq: Every day | ORAL | Status: DC

## 2014-10-25 NOTE — Telephone Encounter (Signed)
Once daily yrs worth can add omeprazole 20 otc prn

## 2014-10-25 NOTE — Telephone Encounter (Signed)
Pt's quantity limit override request for pantoprazole (PROTONIX) 40 MG tablet for BID dosing was denied.  Pt states he's ok with taking one daily which the insurance will cover.  He's requesting that we order this for him at once daily dosing and send to [facility]/[place] with refills.  Pt would like to know if there's anything OTC he can use if the 30 day Rx runs out early (due to taking it BID on the rare occasion it's needed)  Please call pt when done

## 2014-10-25 NOTE — Telephone Encounter (Signed)
Notified patient that protonix once daily was sent to pharmacy can add omeprazole 20 otc prn. Patient verbalized understanding.

## 2014-10-26 ENCOUNTER — Encounter: Payer: Self-pay | Admitting: Gastroenterology

## 2014-10-26 ENCOUNTER — Ambulatory Visit (INDEPENDENT_AMBULATORY_CARE_PROVIDER_SITE_OTHER): Payer: Medicare Other | Admitting: Gastroenterology

## 2014-10-26 VITALS — BP 110/74 | HR 64 | Ht 69.0 in | Wt 166.0 lb

## 2014-10-26 DIAGNOSIS — K625 Hemorrhage of anus and rectum: Secondary | ICD-10-CM | POA: Diagnosis not present

## 2014-10-26 NOTE — Progress Notes (Signed)
Review of pertinent gastrointestinal problems: 1. EGD Ardis Hughs 06/2010 for rectal bleeding, dysphagia: 1) Mild gastritis; biopsied to check for H. pylori2) GERD related edema at [place]) Otherwise normal examination; path showed no h. pylori 2. Colonoscopy Dr. Ardis Hughs 06/2010 done for minor rectal bleeding. : small polyp (was TA on path), internal hemorrhoids.   HPI: This is a very pleasant 59 year old man   who was referred to me by Mikey Kirschner, MD  to evaluate  intermittent rectal bleeding .    Chief complaint is rectal bleeding  Cbc 08/2014 HB 11.7  No longer on blood thinners except ASA 81mg , during his previous colonoscopy, upper endoscopy above he was on blood thinners for coronary artery disease.  Sees minor red blood in his stool.  Can occur at times of constipation but not always.  Overall his weight has been up/down.  No colon cancer in his family.    Review of systems: Pertinent positive and negative review of systems were noted in the above HPI section. Complete review of systems was performed and was otherwise normal.   Past Medical History  Diagnosis Date  . COPD (chronic obstructive pulmonary disease)   . Sigmoid diverticulosis   . DDD (degenerative disc disease)   . Anxiety   . Depression   . Psoriasis   . Tubular adenoma     Colonoscopy 1/12  . Prostate cancer     Metastatic, stage IV  - [name]  . Internal hemorrhoids     Colonoscopy 1/12  . Drug reaction     Pamidronate - induced skin toxicity, Zometa - induced toxicity  . GERD (gastroesophageal reflux disease)   . Gastritis   . Prostate ca 12/29/2010  . Osteoarthritis   . 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 Coronary artery disease     DES to LAD 1/12, has 2 stents  . Myocardial infarction     2012  . Hypertension   . Asthma   . Horseshoe kidney     Single kidney    Past Surgical History  Procedure Laterality Date  . Anterior fusion cervical spine    . Posterior fusion  cervical spine    . Left foot surgery    . Prostatectomy  02/28/2006    [name] in [place], [place].  Marland Kitchen Appendectomy    . Heart stents  06/2010    2  . Ethmoidectomy Left 09/28/2013    Procedure: LEFT ENDOSCOPIC ANTERIOR ETHMOIDECTOMY;  Surgeon: [name], MD;  Location: [facility];  Service: ENT;  Laterality: Left;  . Maxillary antrostomy Left 09/28/2013    Procedure: LEFT ENDOSCOPIC MAXILLARY ANTROSTOMY WITH REMOVAL OF TISSUE;  Surgeon: [name], MD;  Location: [facility];  Service: ENT;  Laterality: Left;    Current Outpatient Prescriptions  Medication Sig Dispense Refill  . ALPRAZolam (XANAX) 0.5 MG tablet Take 1 tablet (0.5 mg total) by mouth 4 (four) times daily as needed for anxiety. 120 tablet 5  . aspirin EC 81 MG EC tablet Take 1 tablet (81 mg total) by mouth daily.    . calcipotriene (DOVONOX) 0.005 % cream Apply topically 2 (two) times daily.     . calcipotriene-betamethasone (TACLONEX) ointment Apply 1 application topically daily. 60 g 3  . CAPEX 0.01 % SHAM Apply 1 application topically as needed (psoriasis).     . Clobetasol Propionate (TEMOVATE) 0.05 % external spray APPLY TO THE AFFECTED AREA UP TO TWICE DAILY AS NEEDED  59 mL 0  . Clobetasol Propionate 0.05 % shampoo USE AS NEEDED 118 mL 0  . Elastic Bandages & Supports (LUMBAR BACK BRACE/SUPPORT PAD) MISC 1 each by Does not apply route as directed. 1 each 1  . fluticasone (FLONASE) 50 MCG/ACT nasal spray Place 1 spray into both nostrils daily.  0  . gabapentin (NEURONTIN) 300 MG capsule TAKE 3 CAPSULES AT BEDTIME 90 capsule 3  . leuprolide, 6 Month, (ELIGARD) 45 MG injection Inject 45 mg into the skin every 6 (six) months.     . meclizine (ANTIVERT) 25 MG tablet Take 25 mg by mouth 3 (three) times daily as needed for dizziness.     . metoprolol succinate (TOPROL-XL) 25 MG 24 hr tablet TAKE 1 TABLET BY MOUTH EVERY DAY 30 tablet 3  . nitroGLYCERIN (NITROSTAT) 0.4 MG SL tablet Place 1 tablet (0.4  mg total) under the tongue every 5 (five) minutes as needed for chest pain. 25 tablet 6  . Oxycodone HCl 10 MG TABS Take 1-2 tablets (10-20 mg total) by mouth every 4 (four) hours as needed. 180 tablet 0  . OxyCODONE HCl ER 60 MG T12A Take 60 mg by mouth every 8 (eight) hours. 90 each 0  . pantoprazole (PROTONIX) 40 MG tablet Take 1 tablet (40 mg total) by mouth daily. 30 tablet 11  . pravastatin (PRAVACHOL) 80 MG tablet Take 1 tablet (80 mg total) by mouth daily. 30 tablet 5  . PROAIR HFA 108 (90 BASE) MCG/ACT inhaler inhale 1 puff every 4 hours WITH AEROCHAMBER 8.5 g 1   No current facility-administered medications for this visit.    Allergies as of 10/26/2014 - Review Complete 10/26/2014  Allergen Reaction Noted  . Other Other (See Comments) 09/28/2013  . Zoledronic acid    . Pamidronate Other (See Comments) and Rash 12/24/2012    Family History  Problem Relation Age of Onset  . Alzheimer's disease Mother   . Lung cancer Father   . Heart attack Brother     History   Social History  . Marital Status: Married    Spouse Name: N/A  . Number of Children: 3  . Years of Education: N/A   Occupational History  . 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     Social History Main Topics  . Smoking status: Former Smoker -- 1.00 packs/day for 15 years    Types: Cigarettes    Quit date: 02/20/2006  . Smokeless tobacco: Never Used  . Alcohol Use: No     Comment: Quit in 2007. Previous 12 pack of beer per week.  . Drug Use: No  . Sexual Activity: Not on file   Other Topics Concern  . Not on file   Social History Narrative     Physical Exam: BP 110/74 mmHg  Pulse 64  Ht 5\' 9"  (1.753 m)  Wt 166 lb (75.297 kg)  BMI 24.50 kg/m2 Constitutional: generally well-appearing Psychiatric: alert and oriented x3 Eyes: extraocular movements intact Mouth: oral pharynx moist, no lesions Neck: supple no lymphadenopathy Cardiovascular: heart regular rate and rhythm Lungs:  clear to auscultation bilaterally Abdomen: soft, nontender, nondistended, no obvious ascites, no peritoneal signs, normal bowel sounds Extremities: no lower extremity edema bilaterally Skin: no lesions on visible extremities   Assessment and plan: 59 y.o. male with  minor intermittent rectal bleeding  I suspect this is of benign etiology however he has a personal history of adenomatous polyps, last colonoscopy was a little over 4  years ago. I recommended we repeat that examination now. I see no reason for any further blood tests or imaging studies prior to then.   Owens Loffler, MD [facility] 10/26/2014, 10:31 AM  Cc: Mikey Kirschner, MD

## 2014-10-26 NOTE — Patient Instructions (Signed)
You will be set up for a colonoscopy for minor rectal bleeding.

## 2014-10-27 ENCOUNTER — Encounter: Payer: Self-pay | Admitting: Physician Assistant

## 2014-10-27 ENCOUNTER — Ambulatory Visit (INDEPENDENT_AMBULATORY_CARE_PROVIDER_SITE_OTHER): Payer: Medicare Other | Admitting: Physician Assistant

## 2014-10-27 VITALS — BP 128/74 | HR 62 | Ht 69.0 in | Wt 163.0 lb

## 2014-10-27 DIAGNOSIS — I2510 Atherosclerotic heart disease of native coronary artery without angina pectoris: Secondary | ICD-10-CM

## 2014-10-27 DIAGNOSIS — E876 Hypokalemia: Secondary | ICD-10-CM

## 2014-10-27 DIAGNOSIS — E782 Mixed hyperlipidemia: Secondary | ICD-10-CM

## 2014-10-27 NOTE — Patient Instructions (Signed)
Your physician wants you to follow-up in: 6 months with Dr. McDowell You will receive a reminder letter in the mail two months in advance. If you don't receive a letter, please call our office to schedule the follow-up appointment.  Your physician recommends that you continue on your current medications as directed. Please refer to the Current Medication list given to you today.  Thank you for choosing [facility]!!    

## 2014-10-27 NOTE — Assessment & Plan Note (Signed)
This occurred in the setting of diarrhea and flulike illness. No recurrence. Labs checked by Dr. Drue Stager after hospitalization.

## 2014-10-27 NOTE — Assessment & Plan Note (Signed)
Patient exercises daily without chest pain. He has occasional chest pressure relieved with nitroglycerin but has not had any for several months. Overall he is stable. He is to call if he has any increase in his symptoms. He has not had any stress testing since his stents were placed in 2012.

## 2014-10-27 NOTE — Assessment & Plan Note (Signed)
Stable on Pravachol. Last lipid profile 03/2014. Will need it repeated in October of this year. Follow-up with Dr. Domenic Polite in 6 months.

## 2014-10-27 NOTE — Progress Notes (Signed)
Cardiology Office Note   Date:  10/27/2014   ID:  [name], [name] Oct 19, 1955, MRN [id number]  PCP:  [name], MD  Cardiologist: Dr. Domenic Polite  Chief Complaint: follow up, occasional chest pain    History of Present Illness: [name] [name] is a 59 y.o. male who presents for routine follow up. He has a history of coronary artery disease status post drug-eluting stent to the LAD in January 2012. 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  Patient was last seen in our office in 05/2013. He has since had an admission to the hospital in March 2016 with diarrhea and hypokalemia with a potassium of 2.1. He also had some chest pain but had negative enzymes and EKGs at that time. He is very active walking a couple miles every day, manages a trailer park, and does all his yard work with mowing and weed eating. He has not had chest pain in several months. On occasion he'll have chest pressure or shortness of breath that is relieved with one nitroglycerin. He rarely has to take it and has been doing well. He had blood work by Dr. Drue Stager within the last month.    Past Medical History  Diagnosis Date  . COPD (chronic obstructive pulmonary disease)   . Sigmoid diverticulosis   . DDD (degenerative disc disease)   . Anxiety   . Depression   . Psoriasis   . Tubular adenoma     Colonoscopy 1/12  . Prostate cancer     Metastatic, stage IV  - [name]  . Internal hemorrhoids     Colonoscopy 1/12  . Drug reaction     Pamidronate - induced skin toxicity, Zometa - induced toxicity  . GERD (gastroesophageal reflux disease)   . Gastritis   . Prostate ca 12/29/2010  . Osteoarthritis   . 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 Coronary artery disease     DES to LAD 1/12, has 2 stents  . Myocardial infarction     2012  . Hypertension   . Asthma   . Horseshoe kidney     Single kidney    Past Surgical History  Procedure Laterality Date  .  Anterior fusion cervical spine    . Posterior fusion cervical spine    . Left foot surgery    . Prostatectomy  02/28/2006    [name] in [place], [place].  Marland Kitchen Appendectomy    . Heart stents  06/2010    2  . Ethmoidectomy Left 09/28/2013    Procedure: LEFT ENDOSCOPIC ANTERIOR ETHMOIDECTOMY;  Surgeon: [name], MD;  Location: [facility];  Service: ENT;  Laterality: Left;  . Maxillary antrostomy Left 09/28/2013    Procedure: LEFT ENDOSCOPIC MAXILLARY ANTROSTOMY WITH REMOVAL OF TISSUE;  Surgeon: [name], MD;  Location: [facility];  Service: ENT;  Laterality: Left;     Current Outpatient Prescriptions  Medication Sig Dispense Refill  . ALPRAZolam (XANAX) 0.5 MG tablet Take 1 tablet (0.5 mg total) by mouth 4 (four) times daily as needed for anxiety. 120 tablet 5  . aspirin EC 81 MG EC tablet Take 1 tablet (81 mg total) by mouth daily.    . calcipotriene (DOVONOX) 0.005 % cream Apply topically 2 (two) times daily.     . calcipotriene-betamethasone (TACLONEX) ointment Apply 1 application topically daily. 60 g 3  . CAPEX 0.01 % SHAM Apply 1 application topically as needed (  psoriasis).     . Clobetasol Propionate (TEMOVATE) 0.05 % external spray APPLY TO THE AFFECTED AREA UP TO TWICE DAILY AS NEEDED 59 mL 0  . Clobetasol Propionate 0.05 % shampoo USE AS NEEDED 118 mL 0  . Elastic Bandages & Supports (LUMBAR BACK BRACE/SUPPORT PAD) MISC 1 each by Does not apply route as directed. 1 each 1  . fluticasone (FLONASE) 50 MCG/ACT nasal spray Place 1 spray into both nostrils daily.  0  . gabapentin (NEURONTIN) 300 MG capsule TAKE 3 CAPSULES AT BEDTIME 90 capsule 3  . leuprolide, 6 Month, (ELIGARD) 45 MG injection Inject 45 mg into the skin every 6 (six) months.     . meclizine (ANTIVERT) 25 MG tablet Take 25 mg by mouth 3 (three) times daily as needed for dizziness.     . metoprolol succinate (TOPROL-XL) 25 MG 24 hr tablet TAKE 1 TABLET BY MOUTH EVERY DAY 30 tablet 3  .  nitroGLYCERIN (NITROSTAT) 0.4 MG SL tablet Place 1 tablet (0.4 mg total) under the tongue every 5 (five) minutes as needed for chest pain. 25 tablet 6  . Oxycodone HCl 10 MG TABS Take 1-2 tablets (10-20 mg total) by mouth every 4 (four) hours as needed. 180 tablet 0  . OxyCODONE HCl ER 60 MG T12A Take 60 mg by mouth every 8 (eight) hours. 90 each 0  . pantoprazole (PROTONIX) 40 MG tablet Take 1 tablet (40 mg total) by mouth daily. 30 tablet 11  . pravastatin (PRAVACHOL) 80 MG tablet Take 1 tablet (80 mg total) by mouth daily. 30 tablet 5  . PROAIR HFA 108 (90 BASE) MCG/ACT inhaler inhale 1 puff every 4 hours WITH AEROCHAMBER 8.5 g 1   No current facility-administered medications for this visit.    Allergies:   Other; Zoledronic acid; and Pamidronate    Social History:  The patient  reports that he quit smoking about 8 years ago. His smoking use included Cigarettes. He has a 15 pack-year smoking history. He has never used smokeless tobacco. He reports that he does not drink alcohol or use illicit drugs.   Family History:  The patient's  family history includes Alzheimer's disease in his mother; Heart attack in his brother; Lung cancer in his father.    ROS:  Please see the history of present illness.   Otherwise, review of systems are positive for none.   All other systems are reviewed and negative.    PHYSICAL EXAM: VS:  BP 128/74 mmHg  Pulse 62  Ht 5\' 9"  (1.753 m)  Wt 163 lb (73.936 kg)  BMI 24.06 kg/m2  SpO2 96% , BMI Body mass index is 24.06 kg/(m^2). GEN: Well nourished, well developed, in no acute distress Neck: no JVD, HJR, carotid bruits, or masses Cardiac: RRR; positive S4, no murmurs, rubs, thrill or heave,  Respiratory:  Decreased breath sounds but clear to auscultation bilaterally, normal work of breathing GI: soft, nontender, nondistended, + BS MS: no deformity or atrophy Extremities: without cyanosis, clubbing, edema, good distal pulses bilaterally.  Skin: warm and  dry, no rash Neuro:  Strength and sensation are intact    EKG:  EKG is not ordered today. The ekg reviewed from hospitalization in 08/2014 normal sinus rhythm with poor R-wave progression nonspecific ST T wave changes, no acute change   Recent Labs: 03/17/2014: ALT 11 08/18/2014: Hemoglobin 11.7*; Magnesium 2.2; Platelets 311 08/19/2014: BUN 8; Creatinine 0.81; Potassium 4.0; Sodium 137    Lipid Panel    Component Value Date/Time  CHOL 125 03/17/2014 1245   TRIG 151* 03/17/2014 1245   HDL 39* 03/17/2014 1245   CHOLHDL 3.2 03/17/2014 1245   VLDL 30 03/17/2014 1245   LDLCALC 56 03/17/2014 1245      Wt Readings from Last 3 Encounters:  10/27/14 163 lb (73.936 kg)  10/26/14 166 lb (75.297 kg)  09/02/14 171 lb (77.565 kg)      Other studies Reviewed: Additional studies/ records that were reviewed today include and review of the records demonstrates: 2-D echo 2014  Study Conclusions  - Left ventricle: The cavity size was normal. There was mild   focal basal hypertrophy of the septum. Systolic function   was normal. Wall motion was normal; there were no regional   wall motion abnormalities. - Aortic valve: Mildly calcified annulus. Trileaflet; normal   thickness leaflets. - Atrial septum: No defect or patent foramen ovale was   identified. - Pulmonary arteries: PA peak pressure: 74mm Hg (S). Impressions:  - Compared to the prior study of 06/28/10, there has been no   significant interval change   ASSESSMENT AND PLAN:  CORONARY ATHEROSCLEROSIS NATIVE CORONARY ARTERY Patient exercises daily without chest pain. He has occasional chest pressure relieved with nitroglycerin but has not had any for several months. Overall he is stable. He is to call if he has any increase in his symptoms. He has not had any stress testing since his stents were placed in 2012.   Mixed hyperlipidemia Stable on Pravachol. Last lipid profile 03/2014. Will need it repeated in October of this  year. Follow-up with Dr. Domenic Polite in 6 months.   Hypokalemia This occurred in the setting of diarrhea and flulike illness. No recurrence. Labs checked by Dr. Drue Stager after hospitalization.     Sumner Boast, PA-C  10/27/2014 12:02 PM    [facility] [address], [place], [place]  [address] Phone: [phone]; Fax: [phone]

## 2014-10-28 ENCOUNTER — Encounter: Payer: Self-pay | Admitting: Gastroenterology

## 2014-11-02 ENCOUNTER — Other Ambulatory Visit: Payer: Self-pay | Admitting: Family Medicine

## 2014-11-08 ENCOUNTER — Encounter: Payer: Self-pay | Admitting: Gastroenterology

## 2014-11-08 ENCOUNTER — Ambulatory Visit (AMBULATORY_SURGERY_CENTER): Payer: Medicare Other | Admitting: Gastroenterology

## 2014-11-08 VITALS — BP 102/66 | HR 67 | Temp 96.9°F | Resp 18 | Ht 69.0 in | Wt 166.0 lb

## 2014-11-08 DIAGNOSIS — K625 Hemorrhage of anus and rectum: Secondary | ICD-10-CM

## 2014-11-08 DIAGNOSIS — D125 Benign neoplasm of sigmoid colon: Secondary | ICD-10-CM | POA: Diagnosis not present

## 2014-11-08 DIAGNOSIS — K635 Polyp of colon: Secondary | ICD-10-CM | POA: Diagnosis not present

## 2014-11-08 DIAGNOSIS — K5730 Diverticulosis of large intestine without perforation or abscess without bleeding: Secondary | ICD-10-CM | POA: Diagnosis not present

## 2014-11-08 DIAGNOSIS — D120 Benign neoplasm of cecum: Secondary | ICD-10-CM | POA: Diagnosis not present

## 2014-11-08 MED ORDER — SODIUM CHLORIDE 0.9 % IV SOLN: 500.0000 mL | INTRAVENOUS | Status: DC

## 2014-11-08 NOTE — Patient Instructions (Signed)
YOU HAD AN ENDOSCOPIC PROCEDURE TODAY AT [facility]:   Refer to the procedure report that was given to you for any specific questions about what was found during the examination.  If the procedure report does not answer your questions, please call your gastroenterologist to clarify.  If you requested that your care partner not be given the details of your procedure findings, then the procedure report has been included in a sealed envelope for you to review at your convenience later.  YOU SHOULD EXPECT: Some feelings of bloating in the abdomen. Passage of more gas than usual.  Walking can help get rid of the air that was put into your GI tract during the procedure and reduce the bloating. If you had a lower endoscopy (such as a colonoscopy or flexible sigmoidoscopy) you may notice spotting of blood in your stool or on the toilet paper. If you underwent a bowel prep for your procedure, you may not have a normal bowel movement for a few days.  Please Note:  You might notice some irritation and congestion in your nose or some drainage.  This is from the oxygen used during your procedure.  There is no need for concern and it should clear up in a day or so.  SYMPTOMS TO REPORT IMMEDIATELY:   Following lower endoscopy (colonoscopy or flexible sigmoidoscopy):  Excessive amounts of blood in the stool  Significant tenderness or worsening of abdominal pains  Swelling of the abdomen that is new, acute  Fever of 100F or higher   For urgent or emergent issues, a gastroenterologist can be reached at any hour by calling [phone].   DIET: Your first meal following the procedure should be a small meal and then it is ok to progress to your normal diet. Heavy or fried foods are harder to digest and may make you feel nauseous or bloated.  Likewise, meals heavy in dairy and vegetables can increase bloating.  Drink plenty of fluids but you should avoid alcoholic beverages for 24  hours.  ACTIVITY:  You should plan to take it easy for the rest of today and you should NOT DRIVE or use heavy machinery until tomorrow (because of the sedation medicines used during the test).    FOLLOW UP: Our staff will call the number listed on your records the next business day following your procedure to check on you and address any questions or concerns that you may have regarding the information given to you following your procedure. If we do not reach you, we will leave a message.  However, if you are feeling well and you are not experiencing any problems, there is no need to return our call.  We will assume that you have returned to your regular daily activities without incident.  If any biopsies were taken you will be contacted by phone or by letter within the next 1-3 weeks.  Please call us at [phone] if you have not heard about the biopsies in 3 weeks.    SIGNATURES/CONFIDENTIALITY: You and/or your care partner have signed paperwork which will be entered into your electronic medical record.  These signatures attest to the fact that that the information above on your After Visit Summary has been reviewed and is understood.  Full responsibility of the confidentiality of this discharge information lies with you and/or your care-partner.  Polyp, diverticulosis, high fiber diet, and hemorrhoid information given.

## 2014-11-08 NOTE — Progress Notes (Signed)
Called to room to assist during endoscopic procedure.  Patient ID and intended procedure confirmed with present staff. Received instructions for my participation in the procedure from the performing physician.  

## 2014-11-08 NOTE — Op Note (Signed)
[facility]  [address] [place], [id number]   COLONOSCOPY PROCEDURE REPORT  PATIENT: [name] [name], [name] [name]  MR#: [id number] BIRTHDATE: 1955-12-16 , 58  yrs. old GENDER: male ENDOSCOPIST: Milus Banister, MD PROCEDURE DATE:  11/08/2014 PROCEDURE:   Colonoscopy with biopsy, Colonoscopy, diagnostic, and Colonoscopy with snare polypectomy First Screening Colonoscopy - Avg.  risk and is 50 yrs.  old or older - No.  Prior Negative Screening - Now for repeat screening. N/A  History of Adenoma - Now for follow-up colonoscopy & has been > or = to 3 yrs.  Yes hx of adenoma.  Has been 3 or more years since last colonoscopy.  Polyps removed today? Yes ASA CLASS:   Class II INDICATIONS:minor rectal bleeding (clonoscopy 2012 Ardis Hughs, single small TA). MEDICATIONS: Monitored anesthesia care and Propofol 200 mg IV  DESCRIPTION OF PROCEDURE:   After the risks benefits and alternatives of the procedure were thoroughly explained, informed consent was obtained.  The digital rectal exam revealed no abnormalities of the rectum.   The LB [id number]  endoscope was introduced through the anus and advanced to the cecum, which was identified by both the appendix and ileocecal valve. No adverse events experienced.   The quality of the prep was excellent.  The instrument was then slowly withdrawn as the colon was fully examined. Estimated blood loss is zero unless otherwise noted in this procedure report.  COLON FINDINGS: A sessile polyp measuring 1 mm in size was found at the cecum.  A polypectomy was performed with cold forceps.  The resection was complete, the polyp tissue was completely retrieved and sent to histology.   A sessile polyp measuring 4 mm in size was found in the sigmoid colon.  A polypectomy was performed with a cold snare.  The resection was complete, the polyp tissue was completely retrieved and sent to histology.   There was mild diverticulosis noted throughout the  entire examined colon. Moderate sized internal hemorrhoids were found.   The examination was otherwise normal.  Retroflexed views revealed no abnormalities. The time to cecum = 1.9 Withdrawal time = 7.5   The scope was withdrawn and the procedure completed. COMPLICATIONS: There were no immediate complications. ENDOSCOPIC IMPRESSION: 1.   Sessile polyp was found at the cecum; polypectomy was performed with cold forceps 2.   Sessile polyp was found in the sigmoid colon; polypectomy was performed with a cold snare 3.   Mild diverticulosis was noted throughout the entire examined colon 4.   Moderate sized internal hemorrhoids 5.   The examination was otherwise normal RECOMMENDATIONS: If the polyp(s) removed today are proven to be adenomatous (pre-cancerous) polyps, you will need a repeat colonoscopy in 5 years.  Otherwise you should continue to follow colorectal cancer screening guidelines for "routine risk" patients with colonoscopy in 10 years.  You will receive a letter within 1-2 weeks with the results of your biopsy as well as final recommendations.  Please call my office if you have not received a letter after 3 weeks.  eSigned:  Milus Banister, MD 11/08/2014 9:26 AM   cc: Sallee Lange, MD   PATIENT NAME:  [name] [name], [name] [name] MR#: [id number]

## 2014-11-08 NOTE — Progress Notes (Signed)
Report to PACU, RN, vss, BBS= Clear.  

## 2014-11-09 ENCOUNTER — Telehealth: Payer: Self-pay | Admitting: Emergency Medicine

## 2014-11-09 NOTE — Telephone Encounter (Signed)
Left message on vm

## 2014-11-12 ENCOUNTER — Emergency Department (HOSPITAL_COMMUNITY)
Admission: EM | Admit: 2014-11-12 | Discharge: 2014-11-13 | Disposition: A | Discharge status: Home / Self Care | Payer: Medicare Other | Attending: Emergency Medicine | Admitting: Emergency Medicine

## 2014-11-12 ENCOUNTER — Emergency Department (HOSPITAL_COMMUNITY): Payer: Medicare Other

## 2014-11-12 ENCOUNTER — Encounter: Payer: Self-pay | Admitting: Gastroenterology

## 2014-11-12 ENCOUNTER — Encounter (HOSPITAL_COMMUNITY): Payer: Self-pay | Admitting: *Deleted

## 2014-11-12 DIAGNOSIS — I2510 Atherosclerotic heart disease of native coronary artery without angina pectoris: Secondary | ICD-10-CM | POA: Diagnosis not present

## 2014-11-12 DIAGNOSIS — Q631 Lobulated, fused and horseshoe kidney: Secondary | ICD-10-CM | POA: Diagnosis not present

## 2014-11-12 DIAGNOSIS — J449 Chronic obstructive pulmonary disease, unspecified: Secondary | ICD-10-CM | POA: Insufficient documentation

## 2014-11-12 DIAGNOSIS — K029 Dental caries, unspecified: Secondary | ICD-10-CM | POA: Diagnosis not present

## 2014-11-12 DIAGNOSIS — F329 Major depressive disorder, single episode, unspecified: Secondary | ICD-10-CM | POA: Diagnosis not present

## 2014-11-12 DIAGNOSIS — Z7951 Long term (current) use of inhaled steroids: Secondary | ICD-10-CM | POA: Insufficient documentation

## 2014-11-12 DIAGNOSIS — K219 Gastro-esophageal reflux disease without esophagitis: Secondary | ICD-10-CM | POA: Diagnosis not present

## 2014-11-12 DIAGNOSIS — Y998 Other external cause status: Secondary | ICD-10-CM | POA: Insufficient documentation

## 2014-11-12 DIAGNOSIS — Z87891 Personal history of nicotine dependence: Secondary | ICD-10-CM | POA: Insufficient documentation

## 2014-11-12 DIAGNOSIS — Z8546 Personal history of malignant neoplasm of prostate: Secondary | ICD-10-CM | POA: Insufficient documentation

## 2014-11-12 DIAGNOSIS — M1990 Unspecified osteoarthritis, unspecified site: Secondary | ICD-10-CM | POA: Diagnosis not present

## 2014-11-12 DIAGNOSIS — I10 Essential (primary) hypertension: Secondary | ICD-10-CM | POA: Insufficient documentation

## 2014-11-12 DIAGNOSIS — Z23 Encounter for immunization: Secondary | ICD-10-CM | POA: Insufficient documentation

## 2014-11-12 DIAGNOSIS — I252 Old myocardial infarction: Secondary | ICD-10-CM | POA: Diagnosis not present

## 2014-11-12 DIAGNOSIS — Z86018 Personal history of other benign neoplasm: Secondary | ICD-10-CM | POA: Insufficient documentation

## 2014-11-12 DIAGNOSIS — Z7982 Long term (current) use of aspirin: Secondary | ICD-10-CM | POA: Insufficient documentation

## 2014-11-12 DIAGNOSIS — S0012XA Contusion of left eyelid and periocular area, initial encounter: Secondary | ICD-10-CM | POA: Insufficient documentation

## 2014-11-12 DIAGNOSIS — F419 Anxiety disorder, unspecified: Secondary | ICD-10-CM | POA: Insufficient documentation

## 2014-11-12 DIAGNOSIS — S0292XB Unspecified fracture of facial bones, initial encounter for open fracture: Secondary | ICD-10-CM | POA: Insufficient documentation

## 2014-11-12 DIAGNOSIS — Y929 Unspecified place or not applicable: Secondary | ICD-10-CM | POA: Insufficient documentation

## 2014-11-12 DIAGNOSIS — Z79899 Other long term (current) drug therapy: Secondary | ICD-10-CM | POA: Insufficient documentation

## 2014-11-12 DIAGNOSIS — Z872 Personal history of diseases of the skin and subcutaneous tissue: Secondary | ICD-10-CM | POA: Diagnosis not present

## 2014-11-12 DIAGNOSIS — Y9364 Activity, baseball: Secondary | ICD-10-CM | POA: Insufficient documentation

## 2014-11-12 DIAGNOSIS — W2107XA Struck by softball, initial encounter: Secondary | ICD-10-CM | POA: Insufficient documentation

## 2014-11-12 DIAGNOSIS — S01412A Laceration without foreign body of left cheek and temporomandibular area, initial encounter: Secondary | ICD-10-CM | POA: Diagnosis present

## 2014-11-12 MED ORDER — TETANUS-DIPHTH-ACELL PERTUSSIS 5-2.5-18.5 LF-MCG/0.5 IM SUSP
0.5000 mL | Freq: Once | INTRAMUSCULAR | Status: AC
  Administered 2014-11-12: 0.5 mL via INTRAMUSCULAR
  Filled 2014-11-12: qty 0.5

## 2014-11-12 MED ORDER — LIDOCAINE HCL (PF) 1 % IJ SOLN
INTRAMUSCULAR | Status: AC
  Filled 2014-11-12: qty 5

## 2014-11-12 MED ORDER — CEPHALEXIN 500 MG PO CAPS
500.0000 mg | ORAL_CAPSULE | Freq: Once | ORAL | Status: AC
  Administered 2014-11-13: 500 mg via ORAL
  Filled 2014-11-12: qty 1

## 2014-11-12 NOTE — ED Notes (Signed)
Lac to lt cheek, struck by softball No LOC, but "saw stars".  Abrasion to lt knee. denies neck pain.triage placed a large bandage to injury to control bleeding. I did not disturb the bandage.

## 2014-11-12 NOTE — ED Provider Notes (Signed)
CSN: [id number]     Arrival date & time 11/12/14  2121 History   First MD Initiated Contact with Patient 11/12/14 2205     Chief Complaint  Patient presents with  . Facial Laceration     (Consider location/radiation/quality/duration/timing/severity/associated sxs/prior Treatment) HPI   [name] [name] is a 59 y.o. male with stage IV prostate cancer, who presents to the Emergency Department complaining of laceration to the left cheek and facial pain that occurred after he was struck in the face by a softball.  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  He c/o continued bleeding and bruising to his left face.  He cleaned the wound and dressing was applied upon ER arrival.  Unsure of last Td.  He denies neck pain, LOC, visual changes, dizziness or vomiting.      Past Medical History  Diagnosis Date  . COPD (chronic obstructive pulmonary disease)   . Sigmoid diverticulosis   . DDD (degenerative disc disease)   . Anxiety   . Depression   . Psoriasis   . Tubular adenoma     Colonoscopy 1/12  . Prostate cancer     Metastatic, stage IV  - [name]  . Internal hemorrhoids     Colonoscopy 1/12  . Drug reaction     Pamidronate - induced skin toxicity, Zometa - induced toxicity  . GERD (gastroesophageal reflux disease)   . Gastritis   . Prostate ca 12/29/2010  . Osteoarthritis   . 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 Coronary artery disease     DES to LAD 1/12, has 2 stents  . Myocardial infarction     2012  . Hypertension   . Asthma   . Horseshoe kidney     Single kidney   Past Surgical History  Procedure Laterality Date  . Anterior fusion cervical spine    . Posterior fusion cervical spine    . Left foot surgery    . Prostatectomy  02/28/2006    [name] in [place], [place].  Marland Kitchen Appendectomy    . Heart stents  06/2010    2  . Ethmoidectomy Left 09/28/2013    Procedure: LEFT ENDOSCOPIC ANTERIOR ETHMOIDECTOMY;   Surgeon: [name], MD;  Location: [facility];  Service: ENT;  Laterality: Left;  . Maxillary antrostomy Left 09/28/2013    Procedure: LEFT ENDOSCOPIC MAXILLARY ANTROSTOMY WITH REMOVAL OF TISSUE;  Surgeon: [name], MD;  Location: [facility];  Service: ENT;  Laterality: Left;   Family History  Problem Relation Age of Onset  . Alzheimer's disease Mother   . Lung cancer Father   . Heart attack Brother    History  Substance Use Topics  . Smoking status: Former Smoker -- 1.00 packs/day for 15 years    Types: Cigarettes    Quit date: 02/20/2006  . Smokeless tobacco: Never Used  . Alcohol Use: No     Comment: Quit in 2007. Previous 12 pack of beer per week.    Review of Systems  Constitutional: Negative for fever and chills.  HENT: Positive for facial swelling. Negative for dental problem and trouble swallowing.   Eyes: Negative for visual disturbance.  Cardiovascular: Negative for chest pain.  Gastrointestinal: Negative for nausea and vomiting.  Musculoskeletal: Negative for back pain and arthralgias.  Skin: Positive for wound.       Laceration left cheek  Neurological: Negative for  dizziness, syncope, weakness and numbness.  Hematological: Bruises/bleeds easily.  Psychiatric/Behavioral: Negative for confusion.  All other systems reviewed and are negative.     Allergies  Other; Zoledronic acid; and Pamidronate  Home Medications   Prior to Admission medications   Medication Sig Start Date End Date Taking? Authorizing Provider  ALPRAZolam Duanne Moron) 0.5 MG tablet Take 1 tablet (0.5 mg total) by mouth 4 (four) times daily as needed for anxiety. 09/02/14   Mikey Kirschner, MD  aspirin EC 81 MG EC tablet Take 1 tablet (81 mg total) by mouth daily. 08/19/14   Erline Hau, MD  calcipotriene (DOVONOX) 0.005 % cream Apply topically 2 (two) times daily.     Historical Provider, MD  calcipotriene-betamethasone (TACLONEX) ointment Apply 1  application topically daily. 08/20/12   Mikey Kirschner, MD  CAPEX 0.01 % SHAM Apply 1 application topically as needed (psoriasis).  08/01/10   Historical Provider, MD  Clobetasol Propionate (TEMOVATE) 0.05 % external spray APPLY EXTERNALLY TO THE AFFECTED AREA UP TO TWICE DAILY AS NEEDED 11/02/14   Mikey Kirschner, MD  Clobetasol Propionate (TEMOVATE) 0.05 % external spray APPLY EXTERNALLY TO THE AFFECTED AREA UP TO TWICE DAILY AS NEEDED 11/02/14   Mikey Kirschner, MD  Elastic Bandages & Supports (LUMBAR BACK BRACE/SUPPORT PAD) MISC 1 each by Does not apply route as directed. 03/31/12   Everardo All, MD  fluticasone (FLONASE) 50 MCG/ACT nasal spray Place 1 spray into both nostrils daily. 07/29/14   Historical Provider, MD  gabapentin (NEURONTIN) 300 MG capsule TAKE 3 CAPSULES AT BEDTIME    Manon Hilding Kefalas, PA-C  leuprolide, 6 Month, (ELIGARD) 45 MG injection Inject 45 mg into the skin every 6 (six) months.  01/04/14   Historical Provider, MD  meclizine (ANTIVERT) 25 MG tablet Take 25 mg by mouth 3 (three) times daily as needed for dizziness.     Historical Provider, MD  metoprolol succinate (TOPROL-XL) 25 MG 24 hr tablet TAKE 1 TABLET BY MOUTH EVERY DAY 10/15/14   Mikey Kirschner, MD  nitroGLYCERIN (NITROSTAT) 0.4 MG SL tablet Place 1 tablet (0.4 mg total) under the tongue every 5 (five) minutes as needed for chest pain. 05/20/13   Imogene Burn, PA-C  Oxycodone HCl 10 MG TABS Take 1-2 tablets (10-20 mg total) by mouth every 4 (four) hours as needed. 11/30/13   Baird Cancer, PA-C  OxyCODONE HCl ER 60 MG T12A Take 60 mg by mouth every 8 (eight) hours. 11/30/13   Baird Cancer, PA-C  pantoprazole (PROTONIX) 40 MG tablet Take 1 tablet (40 mg total) by mouth daily. 10/25/14   Mikey Kirschner, MD  pravastatin (PRAVACHOL) 80 MG tablet Take 1 tablet (80 mg total) by mouth daily. 09/02/14   Mikey Kirschner, MD  PROAIR HFA 108 (90 BASE) MCG/ACT inhaler inhale 1 puff every 4 hours WITH AEROCHAMBER  03/25/14   Mikey Kirschner, MD   BP 128/100 mmHg  Pulse 87  Temp(Src) 98.7 F (37.1 C) (Oral)  Resp 16  Ht 5\' 9"  (1.753 m)  Wt 164 lb (74.39 kg)  BMI 24.21 kg/m2  SpO2 96% Physical Exam  Constitutional: He is oriented to person, place, and time. He appears well-developed and well-nourished. No distress.  HENT:  Head: Head is with laceration.    Right Ear: Tympanic membrane and ear canal normal.  Left Ear: Tympanic membrane and ear canal normal.  Nose: No nose lacerations or sinus tenderness. No epistaxis.  Mouth/Throat: Uvula is  midline, oropharynx is clear and moist and mucous membranes are normal. Dental caries present.  Poor dental hygiene, multiple dental caries.  No dental injury or malocculsion  Eyes: Conjunctivae and EOM are normal. Pupils are equal, round, and reactive to light.  Cardiovascular: Normal rate, regular rhythm, normal heart sounds and intact distal pulses.   No murmur heard. Pulmonary/Chest: Effort normal and breath sounds normal. No respiratory distress.  Musculoskeletal: He exhibits no edema or tenderness.  Neurological: He is alert and oriented to person, place, and time. He exhibits normal muscle tone. Coordination normal.  Skin: Skin is warm. Laceration noted.  Laceration to the left cheek. Slight bleeding present.  ecchymosis distal to left eye.  No palpable bony deformity  Nursing note and vitals reviewed.   ED Course  Procedures (including critical care time) Labs Review Labs Reviewed - No data to display  Imaging Review Ct Maxillofacial Wo Cm  11/12/2014   CLINICAL DATA:  Struck by softball in the left face.  EXAM: CT MAXILLOFACIAL WITHOUT CONTRAST  TECHNIQUE: Multidetector CT imaging of the maxillofacial structures was performed. Multiplanar CT image reconstructions were also generated. A small metallic BB was placed on the right temple in order to reliably differentiate right from left.  COMPARISON:  10/09/2013  FINDINGS: There is soft tissue  swelling in the left cheek, left periorbital and left frontal scalp regions. Ocular globes are intact. Orbits are intact. There is no orbital fracture. Orbital floors are intact. There is a fracture of the anterior wall left maxillary sinus with approximately 4 mm depression. This is superimposed on prior surgery, with a left medial antrectomy. The remainder of the sinus walls are intact. The zygomatic arches and pterygoid plates are intact. Nasal bones are intact.  IMPRESSION: Isolated fracture of the anterior wall of the left maxillary sinus with a few mm depression, superimposed on prior surgical changes. Superficial soft tissue swelling in the left cheek, left periorbital and left frontal scalp regions. Orbits are intact.   Electronically Signed   By: Andreas Newport M.D.   On: 11/12/2014 22:25     EKG Interpretation None       LACERATION REPAIR Performed by: Nia Nathaniel L. Authorized by: Hale Bogus Consent: Verbal consent obtained. Risks and benefits: risks, benefits and alternatives were discussed Consent given by: patient Patient identity confirmed: provided demographic data Prepped and Draped in normal sterile fashion Wound explored  Laceration Location: left cheek  Laceration Length: 4.5 cm  No Foreign Bodies seen or palpated  Anesthesia: local infiltration  Local anesthetic: lidocaine 1% w/o  epinephrine  Anesthetic total: 2 ml  Irrigation method: syringe Amount of cleaning: standard  Skin closure: 5-0 prolene  Number of sutures: 9  Technique: simple interrupted  Patient tolerance: Patient tolerated the procedure well with no immediate complications.   MDM   Final diagnoses:  Facial fracture, open, initial encounter   Td updated.  Patient also seen by Dr. Roderic Palau.  He agrees to wound care instructions and close f/u with [name].    rx for keflex given open fx.  Pt appears stable for d/c   Kem Parkinson, PA-C 11/14/14 [place],  MD 11/16/14 [phone]

## 2014-11-12 NOTE — ED Notes (Signed)
Pt was hit in the left cheek with a softball. Pt has a 1.5 in laceration, bleeding is not controlled at this time.

## 2014-11-12 NOTE — ED Notes (Signed)
Tammy PA at bedside,

## 2014-11-13 DIAGNOSIS — S0292XB Unspecified fracture of facial bones, initial encounter for open fracture: Secondary | ICD-10-CM | POA: Diagnosis not present

## 2014-11-13 MED ORDER — CEPHALEXIN 500 MG PO CAPS: 500.0000 mg | ORAL_CAPSULE | Freq: Four times a day (QID) | ORAL | Status: DC

## 2014-11-13 NOTE — Discharge Instructions (Signed)
Facial Fracture A facial fracture is a break in one of the bones of your face. HOME CARE INSTRUCTIONS   Protect the injured part of your face until it is healed.  Do not participate in activities which give chance for re-injury until your doctor approves.  Gently wash and dry your face.  Wear head and facial protection while riding a bicycle, motorcycle, or snowmobile. SEEK MEDICAL CARE IF:   An oral temperature above 102 F (38.9 C) develops.  You have severe headaches or notice changes in your vision.  You have new numbness or tingling in your face.  You develop nausea (feeling sick to your stomach), vomiting or a stiff neck. SEEK IMMEDIATE MEDICAL CARE IF:   You develop difficulty seeing or experience double vision.  You become dizzy, lightheaded, or faint.  You develop trouble speaking, breathing, or swallowing.  You have a watery discharge from your nose or ear. MAKE SURE YOU:   Understand these instructions.  Will watch your condition.  Will get help right away if you are not doing well or get worse. Document Released: 05/21/2005 Document Revised: 08/13/2011 Document Reviewed: 01/08/2008 [facility] Patient Information 2015 [place], [place]. This information is not intended to replace advice given to you by your health care provider. Make sure you discuss any questions you have with your health care provider.

## 2014-11-18 ENCOUNTER — Ambulatory Visit (INDEPENDENT_AMBULATORY_CARE_PROVIDER_SITE_OTHER): Payer: Medicare Other | Admitting: Otolaryngology

## 2014-11-18 DIAGNOSIS — S02401A Maxillary fracture, unspecified, initial encounter for closed fracture: Secondary | ICD-10-CM | POA: Diagnosis not present

## 2014-12-09 ENCOUNTER — Ambulatory Visit (INDEPENDENT_AMBULATORY_CARE_PROVIDER_SITE_OTHER): Payer: Medicare Other | Admitting: Otolaryngology

## 2014-12-09 DIAGNOSIS — H6983 Other specified disorders of Eustachian tube, bilateral: Secondary | ICD-10-CM

## 2014-12-09 DIAGNOSIS — J320 Chronic maxillary sinusitis: Secondary | ICD-10-CM | POA: Diagnosis not present

## 2015-02-15 ENCOUNTER — Other Ambulatory Visit: Payer: Self-pay | Admitting: Family Medicine

## 2015-03-08 ENCOUNTER — Ambulatory Visit (INDEPENDENT_AMBULATORY_CARE_PROVIDER_SITE_OTHER): Payer: Medicare Other | Admitting: Family Medicine

## 2015-03-08 ENCOUNTER — Encounter: Payer: Self-pay | Admitting: Family Medicine

## 2015-03-08 VITALS — BP 112/80 | Ht 69.0 in | Wt 169.0 lb

## 2015-03-08 DIAGNOSIS — I2510 Atherosclerotic heart disease of native coronary artery without angina pectoris: Secondary | ICD-10-CM

## 2015-03-08 DIAGNOSIS — K219 Gastro-esophageal reflux disease without esophagitis: Secondary | ICD-10-CM

## 2015-03-08 DIAGNOSIS — E785 Hyperlipidemia, unspecified: Secondary | ICD-10-CM | POA: Diagnosis not present

## 2015-03-08 DIAGNOSIS — Z23 Encounter for immunization: Secondary | ICD-10-CM

## 2015-03-08 DIAGNOSIS — E782 Mixed hyperlipidemia: Secondary | ICD-10-CM | POA: Diagnosis not present

## 2015-03-08 DIAGNOSIS — Z79899 Other long term (current) drug therapy: Secondary | ICD-10-CM

## 2015-03-08 NOTE — Progress Notes (Signed)
   Subjective:    Patient ID: [name] [name], male    DOB: 1956/04/19, 59 y.o.   MRN: [id number]  Hyperlipidemia This is a chronic problem. The current episode started more than 1 year ago. There are no known factors aggravating his hyperlipidemia. Current antihyperlipidemic treatment includes statins. The current treatment provides moderate improvement of lipids. There are no compliance problems.  There are no known risk factors for coronary artery disease.   Taking pred fpr bk pain,   Compliant with blood pressure medication. No obvious side effects. Has cut down salt intake. Exercising some.  Occasional wheezing. Uses inhaler rarely. Helps his symptoms.  Reflux overll doing pretty good, ocmpliant with med . Without the medication has reemergence of symptoms. Need to take it.   Review of Systems No headache no chest pain no back pain no abdominal pain no change in bowel habits    Objective:   Physical Exam Alert vital stable HEENT normal neck supple. Lungs clear heart rare rhythm. Ankles without edema.       Assessment & Plan:  Impression 1 hyperlipidemia status uncertain will evaluate and adjust as needed #2 hypertension clinically stable pressure good #3 reflux clinically stable number for reactive airways good control plan flu vaccine. Appropriate blood work. Medications refilled. Diet exercise discussed. Recheck in 6 months. WSL

## 2015-03-10 LAB — HEPATIC FUNCTION PANEL
ALT: 10 IU/L (ref 0–44)
AST: 15 IU/L (ref 0–40)
Albumin: 4.1 g/dL (ref 3.6–4.8)
Alkaline Phosphatase: 84 IU/L (ref 39–117)
Bilirubin Total: 0.2 mg/dL (ref 0.0–1.2)
Bilirubin, Direct: 0.08 mg/dL (ref 0.00–0.40)
Total Protein: 6.2 g/dL (ref 6.0–8.5)

## 2015-03-10 LAB — LIPID PANEL
Chol/HDL Ratio: 3.3 ratio units (ref 0.0–5.0)
Cholesterol, Total: 145 mg/dL (ref 100–199)
HDL: 44 mg/dL (ref >39)
LDL Calculated: 68 mg/dL (ref 0–99)
Triglycerides: 167 mg/dL — ABNORMAL HIGH (ref 0–149)
VLDL Cholesterol Cal: 33 mg/dL (ref 5–40)

## 2015-03-16 ENCOUNTER — Other Ambulatory Visit: Payer: Self-pay | Admitting: Family Medicine

## 2015-03-16 ENCOUNTER — Encounter: Payer: Self-pay | Admitting: Family Medicine

## 2015-04-12 ENCOUNTER — Other Ambulatory Visit: Payer: Self-pay | Admitting: Family Medicine

## 2015-04-15 ENCOUNTER — Other Ambulatory Visit: Payer: Self-pay | Admitting: Family Medicine

## 2015-04-18 ENCOUNTER — Other Ambulatory Visit: Payer: Self-pay | Admitting: *Deleted

## 2015-04-18 ENCOUNTER — Telehealth: Payer: Self-pay | Admitting: *Deleted

## 2015-04-18 NOTE — Telephone Encounter (Signed)
Ok 11 ref 

## 2015-04-18 NOTE — Telephone Encounter (Signed)
Incoming fax from [facility]. Requesting refill on clobetasol 0.05% shampoo 118 ml use as directed. Last seen 03/08/15

## 2015-04-19 ENCOUNTER — Telehealth: Payer: Self-pay | Admitting: Family Medicine

## 2015-04-19 MED ORDER — CLOBETASOL PROPIONATE 0.05 % EX SHAM: MEDICATED_SHAMPOO | CUTANEOUS | Status: DC

## 2015-04-19 NOTE — Telephone Encounter (Signed)
The non-formulary exception request clobetasol propionate 0.05 % has been denied coverage.  They are faxing over a form with alternatives that are on the formulary.

## 2015-04-19 NOTE — Telephone Encounter (Signed)
Clobetasol 0.05% shampoo 118 ml sent into pharmacy per order from [place].

## 2015-04-19 NOTE — Addendum Note (Signed)
Addended by: Launa Grill on: 04/19/2015 08:19 AM   Modules accepted: Orders

## 2015-04-20 ENCOUNTER — Telehealth: Payer: Self-pay | Admitting: Family Medicine

## 2015-04-20 NOTE — Telephone Encounter (Signed)
[employer] called and reported that the prior authorization for Clobetasol 0.05% shampoo has been denied because only 1 formulary has been tried.  2 formularies would have to be tried in order to proceed.  They are going to be sending a letter to our office regarding this.

## 2015-05-18 ENCOUNTER — Telehealth: Payer: Self-pay | Admitting: Family Medicine

## 2015-05-18 NOTE — Telephone Encounter (Signed)
Pt's Clobetasol Propionate (TEMOVATE) 0.05 % external spray and Clobetasol Propionate 0.05 % shampoo were both DENIED, pt must fail 2 formulary alternatives  Please see denial letters and copy of formulary alternatives in green folder  Please advise

## 2015-06-16 ENCOUNTER — Ambulatory Visit (INDEPENDENT_AMBULATORY_CARE_PROVIDER_SITE_OTHER): Payer: PPO | Admitting: Otolaryngology

## 2015-06-16 DIAGNOSIS — J310 Chronic rhinitis: Secondary | ICD-10-CM | POA: Diagnosis not present

## 2015-06-16 DIAGNOSIS — J320 Chronic maxillary sinusitis: Secondary | ICD-10-CM

## 2015-06-20 DIAGNOSIS — C772 Secondary and unspecified malignant neoplasm of intra-abdominal lymph nodes: Secondary | ICD-10-CM | POA: Diagnosis not present

## 2015-06-20 DIAGNOSIS — C61 Malignant neoplasm of prostate: Secondary | ICD-10-CM | POA: Diagnosis not present

## 2015-06-23 DIAGNOSIS — T07 Unspecified multiple injuries: Secondary | ICD-10-CM | POA: Diagnosis not present

## 2015-06-23 DIAGNOSIS — L219 Seborrheic dermatitis, unspecified: Secondary | ICD-10-CM | POA: Diagnosis not present

## 2015-07-07 DIAGNOSIS — T07 Unspecified multiple injuries: Secondary | ICD-10-CM | POA: Diagnosis not present

## 2015-07-18 DIAGNOSIS — C772 Secondary and unspecified malignant neoplasm of intra-abdominal lymph nodes: Secondary | ICD-10-CM | POA: Diagnosis not present

## 2015-07-18 DIAGNOSIS — C61 Malignant neoplasm of prostate: Secondary | ICD-10-CM | POA: Diagnosis not present

## 2015-07-21 DIAGNOSIS — T07 Unspecified multiple injuries: Secondary | ICD-10-CM | POA: Diagnosis not present

## 2015-08-15 DIAGNOSIS — C772 Secondary and unspecified malignant neoplasm of intra-abdominal lymph nodes: Secondary | ICD-10-CM | POA: Diagnosis not present

## 2015-08-15 DIAGNOSIS — C61 Malignant neoplasm of prostate: Secondary | ICD-10-CM | POA: Diagnosis not present

## 2015-09-06 ENCOUNTER — Ambulatory Visit: Payer: Medicaid Other | Admitting: Family Medicine

## 2015-09-10 ENCOUNTER — Other Ambulatory Visit: Payer: Self-pay | Admitting: Family Medicine

## 2015-09-25 ENCOUNTER — Other Ambulatory Visit: Payer: Self-pay | Admitting: Family Medicine

## 2015-10-07 ENCOUNTER — Other Ambulatory Visit: Payer: Self-pay | Admitting: Family Medicine

## 2015-10-10 DIAGNOSIS — C61 Malignant neoplasm of prostate: Secondary | ICD-10-CM | POA: Diagnosis not present

## 2015-10-10 DIAGNOSIS — C772 Secondary and unspecified malignant neoplasm of intra-abdominal lymph nodes: Secondary | ICD-10-CM | POA: Diagnosis not present

## 2015-10-14 ENCOUNTER — Other Ambulatory Visit: Payer: Self-pay | Admitting: Family Medicine

## 2015-10-17 ENCOUNTER — Encounter: Payer: Self-pay | Admitting: Family Medicine

## 2015-10-17 ENCOUNTER — Ambulatory Visit (INDEPENDENT_AMBULATORY_CARE_PROVIDER_SITE_OTHER): Payer: PPO | Admitting: Family Medicine

## 2015-10-17 VITALS — BP 120/78 | Ht 69.0 in | Wt 171.0 lb

## 2015-10-17 DIAGNOSIS — E782 Mixed hyperlipidemia: Secondary | ICD-10-CM

## 2015-10-17 DIAGNOSIS — C61 Malignant neoplasm of prostate: Secondary | ICD-10-CM

## 2015-10-17 DIAGNOSIS — I2510 Atherosclerotic heart disease of native coronary artery without angina pectoris: Secondary | ICD-10-CM | POA: Diagnosis not present

## 2015-10-17 DIAGNOSIS — F419 Anxiety disorder, unspecified: Secondary | ICD-10-CM | POA: Diagnosis not present

## 2015-10-17 DIAGNOSIS — K219 Gastro-esophageal reflux disease without esophagitis: Secondary | ICD-10-CM

## 2015-10-17 DIAGNOSIS — J441 Chronic obstructive pulmonary disease with (acute) exacerbation: Secondary | ICD-10-CM | POA: Diagnosis not present

## 2015-10-17 MED ORDER — PANTOPRAZOLE SODIUM 40 MG PO TBEC: DELAYED_RELEASE_TABLET | ORAL | Status: DC

## 2015-10-17 MED ORDER — PRAVASTATIN SODIUM 80 MG PO TABS: ORAL_TABLET | ORAL | Status: DC

## 2015-10-17 MED ORDER — METOPROLOL SUCCINATE ER 25 MG PO TB24: 25.0000 mg | ORAL_TABLET | Freq: Every day | ORAL | Status: DC

## 2015-10-17 NOTE — Progress Notes (Signed)
   Subjective:    Patient ID: [name] [name], male    DOB: Oct 05, 1955, 60 y.o.   MRN: [id number]  Hyperlipidemia This is a chronic problem. The current episode started more than 1 year ago. There are no known factors aggravating his hyperlipidemia. Current antihyperlipidemic treatment includes statins. The current treatment provides moderate improvement of lipids. There are no compliance problems.  There are no known risk factors for coronary artery disease.   X  Compliant with anxiety medication. States deftly needs it. Without it states has a lot of difficulties. Needs it in order to function.  Compliant with clobetasol. Help scalp irritation. Uses it faithfully.  Ongoing reflux. Needs meds. Uses regularly. Next  No obvious side effects from statins. No recent blood work in this regard Patient has no other concerns at this time.                       Review of Systems No headache, no major weight loss or weight gain, no chest pain no back pain abdominal pain no change in bowel habits complete ROS otherwise negative     Objective:   Physical Exam Alert vitals stable HEENT mild nasal congestion some scalp irritation neck supple lungs clear. Heart rare rhythm ankles without edema abdomen benign       Assessment & Plan:  Impression 1 reflux clinically stable stance same meds #2 chronic scalp dermatitis improving meds stay on same #3 hyperlipidemia prior control discussed old blood work reviewed maintain same pending #4 reflux ongoing challenges #5 reactive airway stable plan medications refilled. Diet exercise discussed appropriate blood work recheck in 6 months WSL

## 2015-10-18 LAB — LIPID PANEL
Chol/HDL Ratio: 2.8 ratio units (ref 0.0–5.0)
Cholesterol, Total: 163 mg/dL (ref 100–199)
HDL: 59 mg/dL (ref >39)
LDL Calculated: 82 mg/dL (ref 0–99)
Triglycerides: 108 mg/dL (ref 0–149)
VLDL Cholesterol Cal: 22 mg/dL (ref 5–40)

## 2015-10-18 LAB — BASIC METABOLIC PANEL
BUN/Creatinine Ratio: 9 — ABNORMAL LOW (ref 10–24)
BUN: 7 mg/dL — ABNORMAL LOW (ref 8–27)
CO2: 27 mmol/L (ref 18–29)
Calcium: 9.1 mg/dL (ref 8.6–10.2)
Chloride: 102 mmol/L (ref 96–106)
Creatinine, Ser: 0.78 mg/dL (ref 0.76–1.27)
GFR calc Af Amer: 113 mL/min/{1.73_m2} (ref >59)
GFR calc non Af Amer: 97 mL/min/{1.73_m2} (ref >59)
Glucose: 75 mg/dL (ref 65–99)
Potassium: 4.5 mmol/L (ref 3.5–5.2)
Sodium: 142 mmol/L (ref 134–144)

## 2015-10-18 LAB — HEPATIC FUNCTION PANEL
ALT: 13 [IU]/L (ref 0–44)
AST: 18 [IU]/L (ref 0–40)
Albumin: 4.6 g/dL (ref 3.6–4.8)
Alkaline Phosphatase: 89 [IU]/L (ref 39–117)
Bilirubin Total: <0.2 mg/dL (ref 0.0–1.2)
Bilirubin, Direct: 0.06 mg/dL (ref 0.00–0.40)
Total Protein: 6.4 g/dL (ref 6.0–8.5)

## 2015-10-19 ENCOUNTER — Encounter: Payer: Self-pay | Admitting: Family Medicine

## 2015-11-03 ENCOUNTER — Other Ambulatory Visit: Payer: Self-pay | Admitting: Family Medicine

## 2015-11-07 DIAGNOSIS — C61 Malignant neoplasm of prostate: Secondary | ICD-10-CM | POA: Diagnosis not present

## 2015-11-07 DIAGNOSIS — C772 Secondary and unspecified malignant neoplasm of intra-abdominal lymph nodes: Secondary | ICD-10-CM | POA: Diagnosis not present

## 2015-11-08 DIAGNOSIS — C772 Secondary and unspecified malignant neoplasm of intra-abdominal lymph nodes: Secondary | ICD-10-CM | POA: Diagnosis not present

## 2015-11-08 DIAGNOSIS — G629 Polyneuropathy, unspecified: Secondary | ICD-10-CM | POA: Diagnosis not present

## 2015-11-08 DIAGNOSIS — C61 Malignant neoplasm of prostate: Secondary | ICD-10-CM | POA: Diagnosis not present

## 2015-11-08 DIAGNOSIS — M8710 Osteonecrosis due to drugs, unspecified bone: Secondary | ICD-10-CM | POA: Diagnosis not present

## 2015-11-08 DIAGNOSIS — M4725 Other spondylosis with radiculopathy, thoracolumbar region: Secondary | ICD-10-CM | POA: Diagnosis not present

## 2015-11-08 DIAGNOSIS — G894 Chronic pain syndrome: Secondary | ICD-10-CM | POA: Diagnosis not present

## 2015-11-10 ENCOUNTER — Ambulatory Visit (INDEPENDENT_AMBULATORY_CARE_PROVIDER_SITE_OTHER): Payer: PPO | Admitting: Cardiology

## 2015-11-10 ENCOUNTER — Telehealth: Payer: Self-pay | Admitting: Family Medicine

## 2015-11-10 ENCOUNTER — Encounter: Payer: Self-pay | Admitting: Cardiology

## 2015-11-10 VITALS — BP 106/72 | HR 92 | Ht 69.0 in | Wt 171.0 lb

## 2015-11-10 DIAGNOSIS — F17201 Nicotine dependence, unspecified, in remission: Secondary | ICD-10-CM | POA: Diagnosis not present

## 2015-11-10 DIAGNOSIS — E782 Mixed hyperlipidemia: Secondary | ICD-10-CM | POA: Diagnosis not present

## 2015-11-10 DIAGNOSIS — I10 Essential (primary) hypertension: Secondary | ICD-10-CM

## 2015-11-10 DIAGNOSIS — I2510 Atherosclerotic heart disease of native coronary artery without angina pectoris: Secondary | ICD-10-CM

## 2015-11-10 DIAGNOSIS — C61 Malignant neoplasm of prostate: Secondary | ICD-10-CM

## 2015-11-10 DIAGNOSIS — I25119 Atherosclerotic heart disease of native coronary artery with unspecified angina pectoris: Secondary | ICD-10-CM

## 2015-11-10 NOTE — Telephone Encounter (Signed)
Pt is wanting to know where you would refer him to since Dr Lyda Jester  Is leaving the office is in currently. He thinks he may need to go back  Over to [facility].   He seems to be quite concerned at this point as to what to do  And he is unaware where [facility] is going at this point.   Please advise

## 2015-11-10 NOTE — Telephone Encounter (Signed)
Dr Charlean Merl, just saw neistrom so no hurry

## 2015-11-10 NOTE — Telephone Encounter (Signed)
[name] [name] to see- let pt know dr [name] [name] will see this friday

## 2015-11-10 NOTE — Patient Instructions (Signed)

## 2015-11-10 NOTE — Progress Notes (Signed)
Cardiology Office Note  Date: 11/10/2015   ID: [name] [name], DOB 1955-11-16, MRN [id number]  PCP: [name], MD  Primary Cardiologist: Rozann Lesches, MD   Chief Complaint  Patient presents with  . Coronary Artery Disease    History of Present Illness: [name] [name] is a 60 y.o. male last seen by Ms. Vita Barley in May 2016, our last visit was in 2013. He presents for a follow-up cardiac visit. Reports only rare angina symptoms, recalls taking a nitroglycerin approximately one year ago. He remains active with ADLs and outside chores.  He has not undergone ischemic testing since 2012. We did discuss stress testing, however with no progressing angina symptoms he was comfortable with medical therapy and observation for now.  I reviewed his ECG today which shows sinus bradycardia.  We went over his cardiac medications which include aspirin, Toprol-XL, Pravachol, and as needed nitroglycerin.  Past Medical History  Diagnosis Date  . COPD (chronic obstructive pulmonary disease) ([place])   . Sigmoid diverticulosis   . DDD (degenerative disc disease)   . Anxiety   . Depression   . Psoriasis   . Tubular adenoma     Colonoscopy 1/12  . Prostate cancer ([place])     Metastatic, stage IV  - [name]  . Internal hemorrhoids     Colonoscopy 1/12  . Drug reaction     Pamidronate - induced skin toxicity, Zometa - induced toxicity  . GERD (gastroesophageal reflux disease)   . Gastritis   . Osteoarthritis   . 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 Coronary artery disease     DES to LAD 1/12, has 2 stents  . Myocardial infarction ([place])     2012  . Essential hypertension   . Asthma   . Horseshoe kidney     Single kidney    Past Surgical History  Procedure Laterality Date  . Anterior fusion cervical spine    . Posterior fusion cervical spine    . Left foot surgery    . Prostatectomy  02/28/2006    [name] in [place], [place].  Marland Kitchen Appendectomy    . Heart stents  06/2010    2  . Ethmoidectomy Left 09/28/2013    Procedure: LEFT ENDOSCOPIC ANTERIOR ETHMOIDECTOMY;  Surgeon: [name], MD;  Location: [facility];  Service: ENT;  Laterality: Left;  . Maxillary antrostomy Left 09/28/2013    Procedure: LEFT ENDOSCOPIC MAXILLARY ANTROSTOMY WITH REMOVAL OF TISSUE;  Surgeon: [name], MD;  Location: [facility];  Service: ENT;  Laterality: Left;    Current Outpatient Prescriptions  Medication Sig Dispense Refill  . ALPRAZolam (XANAX) 0.5 MG tablet Take 1 tablet (0.5 mg total) by mouth 4 (four) times daily as needed for anxiety. 120 tablet 5  . aspirin EC 81 MG EC tablet Take 1 tablet (81 mg total) by mouth daily.    . calcipotriene (DOVONOX) 0.005 % cream Apply topically 2 (two) times daily.     . calcipotriene-betamethasone (TACLONEX) ointment Apply 1 application topically daily. 60 g 3  . CAPEX 0.01 % SHAM Apply 1 application topically as needed (psoriasis).     . chlorhexidine (PERIDEX) 0.12 % solution USE 15 ML BY MOUTH TO SWISH AND SPIT TWICE DAILY 473 mL 0  . Clobetasol Propionate (TEMOVATE) 0.05 % external spray APPLY EXTERNALLY TO THE AFFECTED AREA UP TO TWICE DAILY AS NEEDED 59 mL 0  . Clobetasol Propionate 0.05 %  shampoo USE SHAMPOO AS NEEDED 118 mL 11  . Elastic Bandages & Supports (LUMBAR BACK BRACE/SUPPORT PAD) MISC 1 each by Does not apply route as directed. 1 each 1  . fluticasone (FLONASE) 50 MCG/ACT nasal spray Place 1 spray into both nostrils daily.  0  . gabapentin (NEURONTIN) 300 MG capsule TAKE 3 CAPSULES AT BEDTIME 90 capsule 3  . leuprolide, 6 Month, (ELIGARD) 45 MG injection Inject 45 mg into the skin every 6 (six) months.     . meclizine (ANTIVERT) 25 MG tablet Take 25 mg by mouth 3 (three) times daily as needed for dizziness.     . metoprolol succinate (TOPROL-XL) 25 MG 24 hr tablet Take 1 tablet (25 mg total) by mouth daily. 30 tablet 5  . nitroGLYCERIN (NITROSTAT) 0.4 MG SL tablet Place 1 tablet (0.4 mg total) under  the tongue every 5 (five) minutes as needed for chest pain. 25 tablet 6  . Oxycodone HCl 10 MG TABS Take 1-2 tablets (10-20 mg total) by mouth every 4 (four) hours as needed. 180 tablet 0  . OxyCODONE HCl ER 60 MG T12A Take 60 mg by mouth every 8 (eight) hours. 90 each 0  . pantoprazole (PROTONIX) 40 MG tablet TAKE 1 TABLET BY MOUTH DAILY 30 tablet 0  . pravastatin (PRAVACHOL) 80 MG tablet TAKE 1 TABLET(80 MG) BY MOUTH DAILY 30 tablet 5  . PROAIR HFA 108 (90 BASE) MCG/ACT inhaler inhale 1 puff every 4 hours WITH AEROCHAMBER 8.5 g 1   No current facility-administered medications for this visit.   Allergies:  Other; Zoledronic acid; and Pamidronate   Social History: The patient  reports that he quit smoking about 9 years ago. His smoking use included Cigarettes. He has a 15 pack-year smoking history. He has never used smokeless tobacco. He reports that he does not drink alcohol or use illicit drugs.   ROS:  Please see the history of present illness. Otherwise, complete review of systems is positive for none.  All other systems are reviewed and negative.   Physical Exam: VS:  BP 106/72 mmHg  Pulse 92  Ht 5\' 9"  (1.753 m)  Wt 171 lb (77.565 kg)  BMI 25.24 kg/m2  SpO2 97%, BMI Body mass index is 25.24 kg/(m^2).  Wt Readings from Last 3 Encounters:  11/10/15 171 lb (77.565 kg)  10/17/15 171 lb (77.565 kg)  03/08/15 169 lb (76.658 kg)    General: Patient appears comfortable at rest. HEENT: Conjunctiva and lids normal, oropharynx clear. Neck: Supple, no elevated JVP or carotid bruits, no thyromegaly. Lungs: Clear to auscultation, nonlabored breathing at rest. Cardiac: Regular rate and rhythm, no S3 or significant systolic murmur, no pericardial rub. Abdomen: Soft, nontender, bowel sounds present. Extremities: No pitting edema, distal pulses 2+. Skin: Warm and dry. Musculoskeletal: No kyphosis. Neuropsychiatric: Alert and oriented x3, affect grossly appropriate.  ECG: I personally  reviewed the tracing from 08/18/2014 which showed sinus rhythm with lead motion artifact, Q waves in leads V1 and V2, nonspecific ST changes.  Recent Labwork: 10/17/2015: ALT 13; AST 18; BUN 7*; Creatinine, Ser 0.78; Potassium 4.5; Sodium 142     Component Value Date/Time   CHOL 163 10/17/2015 1450   CHOL 125 03/17/2014 1245   TRIG 108 10/17/2015 1450   HDL 59 10/17/2015 1450   HDL 39* 03/17/2014 1245   CHOLHDL 2.8 10/17/2015 1450   CHOLHDL 3.2 03/17/2014 1245   VLDL 30 03/17/2014 1245   LDLCALC 82 10/17/2015 1450   LDLCALC 56 03/17/2014 1245  Other Studies Reviewed Today:  Echocardiogram 10/23/2012: Study Conclusions  - Left ventricle: The cavity size was normal. There was mild focal basal hypertrophy of the septum. Systolic function was normal. Wall motion was normal; there were no regional wall motion abnormalities. - Aortic valve: Mildly calcified annulus. Trileaflet; normal thickness leaflets. - Atrial septum: No defect or patent foramen ovale was identified. - Pulmonary arteries: PA peak pressure: 59mm Hg (S). Impressions:  - Compared to the prior study of 06/28/10, there has been no significant interval change.  Assessment and Plan:  1. CAD status post DES to the LAD in 2012 in the setting of myocardial infarction. He has done well on medical therapy with rare angina symptoms. ECG reviewed and stable. For now he was comfortable with observation. We have discussed follow-up stress testing if symptoms escalate however.  2. Hyperlipidemia on Pravachol, recent LDL 82.  3. Essential hypertension, blood pressure is normal today.  4. 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  Current medicines were reviewed with the patient today.   Orders Placed This Encounter  Procedures  . EKG 12-Lead    Disposition: FU with me in 1 year.   Signed, Satira Sark, MD, [facility] 11/10/2015 11:34 AM    [facility] [place], [place], [place] [address] Phone: [phone]; Fax: [phone]

## 2015-11-11 NOTE — Telephone Encounter (Signed)
Left message on voicemail notifying patient that referral is in the system.

## 2015-11-13 ENCOUNTER — Other Ambulatory Visit: Payer: Self-pay | Admitting: Family Medicine

## 2015-11-14 DIAGNOSIS — C772 Secondary and unspecified malignant neoplasm of intra-abdominal lymph nodes: Secondary | ICD-10-CM | POA: Diagnosis not present

## 2015-11-14 DIAGNOSIS — C61 Malignant neoplasm of prostate: Secondary | ICD-10-CM | POA: Diagnosis not present

## 2015-11-15 ENCOUNTER — Encounter: Payer: Self-pay | Admitting: Gastroenterology

## 2015-11-15 ENCOUNTER — Ambulatory Visit (INDEPENDENT_AMBULATORY_CARE_PROVIDER_SITE_OTHER): Payer: PPO | Admitting: Gastroenterology

## 2015-11-15 ENCOUNTER — Other Ambulatory Visit (HOSPITAL_COMMUNITY): Payer: Self-pay | Admitting: Oncology

## 2015-11-15 VITALS — BP 122/64 | HR 80 | Ht 67.32 in | Wt 165.1 lb

## 2015-11-15 DIAGNOSIS — C61 Malignant neoplasm of prostate: Secondary | ICD-10-CM

## 2015-11-15 DIAGNOSIS — K625 Hemorrhage of anus and rectum: Secondary | ICD-10-CM | POA: Diagnosis not present

## 2015-11-15 NOTE — Patient Instructions (Signed)
Please start taking citrucel (orange flavored) powder fiber supplement.  This may cause some bloating at first but that usually goes away. Begin with a small spoonful and work your way up to a large, heaping spoonful daily over a week. Call here in 5-6 weeks to report on your symptoms.

## 2015-11-15 NOTE — Progress Notes (Signed)
Review of pertinent gastrointestinal problems: 1. EGD Ardis Hughs 06/2010 for rectal bleeding, dysphagia: 1) Mild gastritis; biopsied to check for H. pylori2) GERD related edema at [facility]) Otherwise normal examination; path showed no h. pylori 2. Colonoscopy Dr. Ardis Hughs 06/2010 done for minor rectal bleeding. : small polyp (was TA on path), internal hemorrhoids.  Colonoscopy Dr. Ardis Hughs 11/2014 (for minor rectal bleeding): 2 subCM polyps (one was TA), diverticulosis throughout, + internal hemorrhoids).  Was recommended to have repeat colonoscopy in 5 years.  HPI: This is a  very pleasant 60 year old man whom I last saw one year ago at the time of a colonoscopy. See those results summarized above  Chief complaint is recent mild rectal bleeding, intermittent constipation  Very poor dentition  He saw red fresh blood in his stool a few days ago.  This is only during BM.   Takes narcotic pain meds. Takes these daily for prostate cancer and also low back pains.  He tends to have mild intermittent constipation.   Past Medical History  Diagnosis Date  . COPD (chronic obstructive pulmonary disease) ([place])   . Sigmoid diverticulosis   . DDD (degenerative disc disease)   . Anxiety   . Depression   . Psoriasis   . Tubular adenoma     Colonoscopy 1/12  . Prostate cancer ([place])     Metastatic, stage IV  - [name]  . Internal hemorrhoids     Colonoscopy 1/12  . Drug reaction     Pamidronate - induced skin toxicity, Zometa - induced toxicity  . GERD (gastroesophageal reflux disease)   . Gastritis   . Osteoarthritis   . 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 Coronary artery disease     DES to LAD 1/12, has 2 stents  . Myocardial infarction ([place])     2012  . Essential hypertension   . Asthma   . Horseshoe kidney     Single kidney    Past Surgical History  Procedure Laterality Date  . Anterior fusion cervical spine    . Posterior fusion cervical spine    . Left foot surgery    .  Prostatectomy  02/28/2006    [name] in [place], [place].  Marland Kitchen Appendectomy    . Heart stents  06/2010    2  . Ethmoidectomy Left 09/28/2013    Procedure: LEFT ENDOSCOPIC ANTERIOR ETHMOIDECTOMY;  Surgeon: [name], MD;  Location: [facility];  Service: ENT;  Laterality: Left;  . Maxillary antrostomy Left 09/28/2013    Procedure: LEFT ENDOSCOPIC MAXILLARY ANTROSTOMY WITH REMOVAL OF TISSUE;  Surgeon: [name], MD;  Location: [facility];  Service: ENT;  Laterality: Left;    Current Outpatient Prescriptions  Medication Sig Dispense Refill  . ALPRAZolam (XANAX) 0.5 MG tablet Take 1 tablet (0.5 mg total) by mouth 4 (four) times daily as needed for anxiety. 120 tablet 5  . aspirin EC 81 MG EC tablet Take 1 tablet (81 mg total) by mouth daily.    . calcipotriene (DOVONOX) 0.005 % cream Apply topically 2 (two) times daily.     . calcipotriene-betamethasone (TACLONEX) ointment Apply 1 application topically daily. 60 g 3  . CAPEX 0.01 % SHAM Apply 1 application topically as needed (psoriasis).     . chlorhexidine (PERIDEX) 0.12 % solution USE 15 ML BY MOUTH TO SWISH AND SPIT TWICE DAILY 473 mL 0  . Clobetasol Propionate (TEMOVATE) 0.05 % external spray APPLY EXTERNALLY TO THE  AFFECTED AREA UP TO TWICE DAILY AS NEEDED 59 mL 0  . Clobetasol Propionate 0.05 % shampoo USE SHAMPOO AS NEEDED 118 mL 11  . Elastic Bandages & Supports (LUMBAR BACK BRACE/SUPPORT PAD) MISC 1 each by Does not apply route as directed. 1 each 1  . fluticasone (FLONASE) 50 MCG/ACT nasal spray Place 1 spray into both nostrils daily.  0  . gabapentin (NEURONTIN) 300 MG capsule TAKE 3 CAPSULES AT BEDTIME 90 capsule 3  . leuprolide, 6 Month, (ELIGARD) 45 MG injection Inject 45 mg into the skin every 6 (six) months.     . meclizine (ANTIVERT) 25 MG tablet Take 25 mg by mouth 3 (three) times daily as needed for dizziness.     . metoprolol succinate (TOPROL-XL) 25 MG 24 hr tablet TAKE 1 TABLET BY MOUTH EVERY DAY  30 tablet 5  . nitroGLYCERIN (NITROSTAT) 0.4 MG SL tablet Place 1 tablet (0.4 mg total) under the tongue every 5 (five) minutes as needed for chest pain. 25 tablet 6  . Oxycodone HCl 10 MG TABS Take 1-2 tablets (10-20 mg total) by mouth every 4 (four) hours as needed. 180 tablet 0  . OxyCODONE HCl ER 60 MG T12A Take 60 mg by mouth every 8 (eight) hours. 90 each 0  . pantoprazole (PROTONIX) 40 MG tablet TAKE 1 TABLET BY MOUTH DAILY 30 tablet 0  . pravastatin (PRAVACHOL) 80 MG tablet TAKE 1 TABLET(80 MG) BY MOUTH DAILY 30 tablet 5  . PROAIR HFA 108 (90 BASE) MCG/ACT inhaler inhale 1 puff every 4 hours WITH AEROCHAMBER 8.5 g 1   No current facility-administered medications for this visit.    Allergies as of 11/15/2015 - Review Complete 11/15/2015  Allergen Reaction Noted  . Other Other (See Comments) 09/28/2013  . Zoledronic acid    . Pamidronate Other (See Comments) and Rash 12/24/2012    Family History  Problem Relation Age of Onset  . Alzheimer's disease Mother   . Lung cancer Father   . Heart attack Brother     Social History   Social History  . Marital Status: Married    Spouse Name: N/A  . Number of Children: 3  . Years of Education: N/A   Occupational History  . 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     Social History Main Topics  . Smoking status: Former Smoker -- 1.00 packs/day for 15 years    Types: Cigarettes    Quit date: 02/20/2006  . Smokeless tobacco: Never Used  . Alcohol Use: No     Comment: Quit in 2007. Previous 12 pack of beer per week.  . Drug Use: No  . Sexual Activity: Not on file   Other Topics Concern  . Not on file   Social History Narrative     Physical Exam: Ht 5' 7.32" (1.71 m)  Wt 165 lb 2 oz (74.9 kg)  BMI 25.61 kg/m2 Constitutional: generally well-appearing Psychiatric: alert and oriented x3 Abdomen: soft, nontender, nondistended, no obvious ascites, no peritoneal signs, normal bowel sounds   Assessment and  plan: 60 y.o. male with Minor rectal bleeding in the setting of mild intermittent constipation  He had a colonoscopy 1 year ago and that does not need to be repeated now. At that time I noted some small internal hemorrhoids and I suspect that is the bleeding source, aggravated by mild intermittent constipation. I recommended he start taking a fiber supplement on a once daily basis to help even out  his bowels, decrease his intermittent straining and hopefully decrease the minor rectal bleeding. He will call to report on his response in 5-6 weeks.   Owens Loffler, MD [facility] 11/15/2015, 9:23 AM

## 2015-11-16 ENCOUNTER — Ambulatory Visit (HOSPITAL_COMMUNITY): Payer: Self-pay

## 2015-11-16 ENCOUNTER — Telehealth: Payer: Self-pay | Admitting: Family Medicine

## 2015-11-16 NOTE — Telephone Encounter (Signed)
appt next week with dr [name]

## 2015-11-16 NOTE — Telephone Encounter (Signed)
Pt called stating that he has an appt on Sept 5 at the cancer center and he is worried about his injections and his medications. Pt is all upset because he doesn't know what to do. Pt is wanting to know if Dr. [name] is going to take over his oxycodone and his alprazolam and also wants to know about his injection that is due next month. Please advise.

## 2015-11-16 NOTE — Telephone Encounter (Signed)
Discussed with pt. Pt transferred to front to schedule office visit next week.

## 2015-11-22 ENCOUNTER — Encounter: Payer: Self-pay | Admitting: Family Medicine

## 2015-11-22 ENCOUNTER — Ambulatory Visit (INDEPENDENT_AMBULATORY_CARE_PROVIDER_SITE_OTHER): Payer: PPO | Admitting: Family Medicine

## 2015-11-22 VITALS — BP 110/78 | Ht 69.0 in | Wt 167.1 lb

## 2015-11-22 DIAGNOSIS — C61 Malignant neoplasm of prostate: Secondary | ICD-10-CM

## 2015-11-22 DIAGNOSIS — M792 Neuralgia and neuritis, unspecified: Secondary | ICD-10-CM

## 2015-11-22 DIAGNOSIS — G894 Chronic pain syndrome: Secondary | ICD-10-CM | POA: Diagnosis not present

## 2015-11-22 NOTE — Progress Notes (Signed)
   Subjective:    Patient ID: [name] [name], male    DOB: Nov 20, 1955, 60 y.o.   MRN: [id number] Patient arrives office for very long discussion concerning patient's's challenging situation.  His usual oncologist is departing abruptly along with the whole team. At [facility] Due to shortage of funds according to patient they are shutting down HPI Patient in today to discuss management of pain medications and Xanax.  Patient is been on a very high amount of oxycodone from Dr. Abran Duke. He suffers chronic pain. He has chronic cervical pain after 2 neck surgeries along with a neuropathic element that extends into the left arm.  He also has chronic low back pain with sciatic element which extends primarily into the right leg. 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 With pretty fast run up of oxycodone in recent years. Patient states he will need a refill of oxycodone and Xanax middle of next week. His oncologist has artery departed. And of course we are left trying to respond to this challenging situation. We will maintain the patient's pain medication. Along with his anxiety medicine. He will be following up with Dr. Whitney Muse for his oncology care. I've advised patient with this high oxycodone dose that I think he should see a pain specialist and we will attempt to do that. WSL

## 2015-11-28 ENCOUNTER — Telehealth: Payer: Self-pay | Admitting: Family Medicine

## 2015-11-28 NOTE — Telephone Encounter (Signed)
Per Horris Latino at [facility] cancer center they gave patient scripts today for a one month supply on his pain and nerve medications. Patient given the following scripts today:   Alprazolam 0.5mg  #120 one tab QID PRN                                                                    Oxycodone 10mg  #180 one tab every 4hrs as needed breakthrough pain                                                                    Oxycontin 60mg  #90 one tab every 8hrs  Patient trying to get in at [facility] cancer center and they are requesting we refill these meds in one month if needed till he can get into [facility] cancer center

## 2015-11-28 NOTE — Telephone Encounter (Signed)
[name] fills pain and anxiety meds for patient.  He did them today for one month only.  He is trying to get in to the [facility] cancer center, but they don't think he will get in by the end of the month.  They are requesting that Dr. [name] take over his pain and anxiety meds until he can get in with [facility].  Please advise.

## 2015-11-30 NOTE — Telephone Encounter (Signed)
Spoke with patient and informed her per Dr.Steve Luking- We are but just given two days ago by specialists so wont need for another 28 days. Patient verbalized understanding and stated that he will call back when refills are due.

## 2015-11-30 NOTE — Telephone Encounter (Signed)
Patient called in again today to check to see if you would be taking over the prescriptions below for him until he can see another MD due to Dr.Neijstrom's office closing last week. Please advise?

## 2015-11-30 NOTE — Telephone Encounter (Signed)
We will but just goibven two d ago by specialists so wont need for another 28 days or so????

## 2015-12-14 ENCOUNTER — Encounter: Payer: Self-pay | Admitting: Family Medicine

## 2015-12-14 ENCOUNTER — Ambulatory Visit (INDEPENDENT_AMBULATORY_CARE_PROVIDER_SITE_OTHER): Payer: PPO | Admitting: Family Medicine

## 2015-12-14 VITALS — BP 130/90 | Temp 97.7°F | Ht 69.0 in | Wt 163.0 lb

## 2015-12-14 DIAGNOSIS — R1310 Dysphagia, unspecified: Secondary | ICD-10-CM

## 2015-12-14 MED ORDER — SUCRALFATE 1 G PO TABS: ORAL_TABLET | ORAL | Status: DC

## 2015-12-14 MED ORDER — MUPIROCIN 2 % EX OINT: 1.0000 "application " | TOPICAL_OINTMENT | Freq: Two times a day (BID) | CUTANEOUS | Status: DC

## 2015-12-14 NOTE — Progress Notes (Signed)
   Subjective:    Patient ID: [name] [name], male    DOB: 1956-04-02, 60 y.o.   MRN: [id number]  HPI Patient is here today for difficulty swallowing. Patient states that it feels like "something is stuck in my esophagus when I eat". Burning sensation noted in chest. Onset 1 week ago.Treatments tried: none. Day num [phone] [id number]  Patient did have upper endoscopy several years ago for GI bleeding. History positive for reflux. Next  For the past week solid food dysphagia symptoms, with substantial nausea occasional vomiting, everything gets stuck. Now on very soft diet next  No weight loss no chest pain no shortness of breath  Review of Systems No headache, no major weight loss or weight gain, no chest pain no back pain abdominal pain no change in bowel habits complete ROS otherwise negative     Objective:   Physical Exam  Alert vitals stable. Lungs clear heart regular in rhythm H&T normal pharynx normal abdomen benign Multiple skin lesions venous mild secondary infection     Assessment & Plan:  Impression solid food dysphasia discussed at length with patient likely etiology plan add Carafate. GI referral. Add Bactroban to skin

## 2015-12-15 ENCOUNTER — Encounter (HOSPITAL_BASED_OUTPATIENT_CLINIC_OR_DEPARTMENT_OTHER): Payer: PPO

## 2015-12-15 ENCOUNTER — Encounter (HOSPITAL_COMMUNITY): Payer: PPO

## 2015-12-15 ENCOUNTER — Encounter (HOSPITAL_COMMUNITY): Payer: Self-pay | Admitting: Hematology & Oncology

## 2015-12-15 ENCOUNTER — Other Ambulatory Visit (HOSPITAL_COMMUNITY): Payer: Self-pay | Admitting: Oncology

## 2015-12-15 ENCOUNTER — Encounter (HOSPITAL_COMMUNITY): Payer: PPO | Attending: Hematology & Oncology | Admitting: Hematology & Oncology

## 2015-12-15 VITALS — BP 139/74 | HR 58 | Temp 98.2°F | Resp 18 | Ht 66.0 in | Wt 162.4 lb

## 2015-12-15 DIAGNOSIS — C61 Malignant neoplasm of prostate: Secondary | ICD-10-CM

## 2015-12-15 DIAGNOSIS — G8929 Other chronic pain: Secondary | ICD-10-CM

## 2015-12-15 DIAGNOSIS — E876 Hypokalemia: Secondary | ICD-10-CM

## 2015-12-15 DIAGNOSIS — M8710 Osteonecrosis due to drugs, unspecified bone: Secondary | ICD-10-CM

## 2015-12-15 DIAGNOSIS — F419 Anxiety disorder, unspecified: Secondary | ICD-10-CM

## 2015-12-15 DIAGNOSIS — M549 Dorsalgia, unspecified: Secondary | ICD-10-CM | POA: Diagnosis not present

## 2015-12-15 LAB — CBC WITH DIFFERENTIAL/PLATELET
Basophils Absolute: 0 10*3/uL (ref 0.0–0.1)
Basophils Relative: 0 %
Eosinophils Absolute: 0.1 10*3/uL (ref 0.0–0.7)
Eosinophils Relative: 1 %
HCT: 34.8 % — ABNORMAL LOW (ref 39.0–52.0)
Hemoglobin: 12 g/dL — ABNORMAL LOW (ref 13.0–17.0)
Lymphocytes Relative: 21 %
Lymphs Abs: 1.7 10*3/uL (ref 0.7–4.0)
MCH: 29.8 pg (ref 26.0–34.0)
MCHC: 34.5 g/dL (ref 30.0–36.0)
MCV: 86.4 fL (ref 78.0–100.0)
Monocytes Absolute: 1 10*3/uL (ref 0.1–1.0)
Monocytes Relative: 12 %
Neutro Abs: 5.2 10*3/uL (ref 1.7–7.7)
Neutrophils Relative %: 66 %
Platelets: 284 10*3/uL (ref 150–400)
RBC: 4.03 MIL/uL — ABNORMAL LOW (ref 4.22–5.81)
RDW: 13.2 % (ref 11.5–15.5)
WBC: 8 10*3/uL (ref 4.0–10.5)

## 2015-12-15 LAB — COMPREHENSIVE METABOLIC PANEL
ALT: 14 U/L — ABNORMAL LOW (ref 17–63)
AST: 25 U/L (ref 15–41)
Albumin: 4.4 g/dL (ref 3.5–5.0)
Alkaline Phosphatase: 61 U/L (ref 38–126)
Anion gap: 5 (ref 5–15)
BUN: 8 mg/dL (ref 6–20)
CO2: 27 mmol/L (ref 22–32)
Calcium: 8.7 mg/dL — ABNORMAL LOW (ref 8.9–10.3)
Chloride: 101 mmol/L (ref 101–111)
Creatinine, Ser: 0.68 mg/dL (ref 0.61–1.24)
GFR calc Af Amer: >60 mL/min (ref >60)
GFR calc non Af Amer: >60 mL/min (ref >60)
Glucose, Bld: 106 mg/dL — ABNORMAL HIGH (ref 65–99)
Potassium: 3.3 mmol/L — ABNORMAL LOW (ref 3.5–5.1)
Sodium: 133 mmol/L — ABNORMAL LOW (ref 135–145)
Total Bilirubin: 0.5 mg/dL (ref 0.3–1.2)
Total Protein: 7 g/dL (ref 6.5–8.1)

## 2015-12-15 LAB — PSA: PSA: <0.01 ng/mL (ref 0.00–4.00)

## 2015-12-15 MED ORDER — ALPRAZOLAM 0.5 MG PO TABS: 0.5000 mg | ORAL_TABLET | Freq: Four times a day (QID) | ORAL | Status: DC | PRN

## 2015-12-15 MED ORDER — OXYCODONE HCL ER 60 MG PO T12A: 60.0000 mg | EXTENDED_RELEASE_TABLET | Freq: Three times a day (TID) | ORAL | Status: DC

## 2015-12-15 MED ORDER — LEUPROLIDE ACETATE (6 MONTH) 45 MG IM KIT
45.0000 mg | PACK | Freq: Once | INTRAMUSCULAR | Status: AC
  Administered 2015-12-15: 45 mg via INTRAMUSCULAR
  Filled 2015-12-15: qty 45

## 2015-12-15 MED ORDER — OXYCODONE HCL 10 MG PO TABS: 10.0000 mg | ORAL_TABLET | ORAL | Status: DC | PRN

## 2015-12-15 MED ORDER — POTASSIUM CHLORIDE CRYS ER 20 MEQ PO TBCR: 20.0000 meq | EXTENDED_RELEASE_TABLET | Freq: Two times a day (BID) | ORAL | Status: DC

## 2015-12-15 NOTE — Patient Instructions (Signed)
[place] at [facility] Discharge Instructions  RECOMMENDATIONS MADE BY THE CONSULTANT AND ANY TEST RESULTS WILL BE SENT TO YOUR REFERRING PHYSICIAN.  You were seen by Dr Whitney Muse today. She is going to refer you to see Dr Enrique Sack at [facility] will schedule the appointment for you.   Lab work each month.   [place] in 6 months.  Return to clinic in 3 months for follow-up. Call clinic with any questions or concerns.   Thank you for choosing [place] at [facility] to provide your oncology and hematology care.  To afford each patient quality time with our provider, please arrive at least 15 minutes before your scheduled appointment time.   Beginning January 23rd 2017 lab work for the [facility] will be done in the  Main lab at [facility] on 1st floor. If you have a lab appointment with the [facility] please come in thru the  Main Entrance and check in at the main information desk  You need to re-schedule your appointment should you arrive 10 or more minutes late.  We strive to give you quality time with our providers, and arriving late affects you and other patients whose appointments are after yours.  Also, if you no show three or more times for appointments you may be dismissed from the clinic at the providers discretion.     Again, thank you for choosing [facility].  Our hope is that these requests will decrease the amount of time that you wait before being seen by our physicians.       _____________________________________________________________  Should you have questions after your visit to [facility], please contact our office at [phone] between the hours of 8:30 a.m. and 4:30 p.m.  Voicemails left after 4:30 p.m. will not be returned until the following business day.  For prescription refill requests, have your pharmacy contact our office.         Resources For  Cancer Patients and their Caregivers ? American Cancer Society: Can assist with transportation, wigs, general needs, runs Look Good Feel Better.        518-133-7583 ? Cancer Care: Provides financial assistance, online support groups, medication/co-pay assistance.  1-800-813-HOPE (918)864-8866) ? [facility] Assists [place] cancer patients and their families through emotional , educational and financial support.  501-704-3862 ? [place] DSS Where to apply for food stamps, Medicaid and utility assistance. 6188578650 ? [facility]: Transportation to medical appointments. 680-559-4557 ? Social Security Administration: May apply for disability if have a Stage IV cancer. 418 178 3065 763-583-5204 ? [employer], Disability and Transit Services: Assists with nutrition, care and transit needs. [place] Support Programs: @10RELATIVEDAYS @ > Cancer Support Group  2nd Tuesday of the month 1pm-2pm, Journey Room  > Creative Journey  3rd Tuesday of the month 1130am-1pm, Journey Room  > Look Good Feel Better  1st Wednesday of the month 10am-12 noon, Journey Room (Call [place] to register [phone])

## 2015-12-15 NOTE — Progress Notes (Signed)
[name] [name] presents today for injection per MD orders. Lupron 45mg   administered IM in left Abdomen per patient request. Administration without incident. Patient tolerated well.

## 2015-12-15 NOTE — Progress Notes (Signed)
[name] NOTE  Patient Care Team: [name] Kirschner, MD as PCP - [place], DDS as Consulting Physician (Dentistry) [name] Baptist, MD as Consulting Physician (Otolaryngology)  CHIEF COMPLAINTS/PURPOSE OF CONSULTATION:  Metastatic prostate cancer Chronic Pain, back Osteoporosis   Prostate CA [name])   02/28/2006 Surgery    Radical prostatectomy with Dr. Nevada [name], LN involvement. Final staging stage IV , T1c, N1     02/28/2006 Pathology Results    Poorly Differentiated Adenocarcinoma, GLEASON' S SCORE 4+4=8     06/06/2009 - 12/16/2012 Chemotherapy    XGEVA,  according to records was used Q28 days for "bone protection" but no documented bone metastases (Prior therapy with pamidronate and zometa)     09/16/2013 Imaging    MRI soft tissue mass filling the left maxillary sinuses and L anterior ethmoid air cells, lesion 4.5 x 4 x 6.2 cm. Evidence of involvement of anterior wall of L maxillary sinus     09/28/2013 Surgery    Left endoscopic maxillary antrostomy with tissue removal. Left endoscopic anterior ethmoidectomy     09/28/2013 Pathology Results    No evidence of malignancy     12/07/2013 Imaging    DEXA with osteopenia       HISTORY OF PRESENTING ILLNESS:  [name] 60 y.o. male is here to establish ongoing care for adenocarcinoma of the prostate.  He comes to the [name] today unaccompanied. He is here on referral from [facility] and Dr. Wolfgang [name].  He's brought his medication list, noting that everything on there is accurate except for "some kind of thing for dizziness." He confirms that he sees Dr. Wolfgang [name], and that he's on the 6 month injection. He notes he's been on the same dose of elegard for a long time, since he's been seeing Dr. Tressie [name]. Dr. Nevada [name] in [name] did his surgery, his prostate and 21-22 lymph nodes, 2 of which were positive. After the surgery and healing, he went back to see [name], and was sent over here to Dr. Tressie [name], who  put him on his medications.  He lives halfway between here and [name]. He confirms that he used to be on medicine for his bones, but then he had a problem with his jaw. [name] "pulled most of it out through my nose," removing the biggest part of the mass. He says "I guess he got all of that mass or whatever that was when he did the day surgery in [name]." He notes that he followed up with [name] until he got it all cleaned out. He says "I hope he got it all."  According to records the patient never had documented bone metastases but was maintained on prior zometa/XGEVA monthly. He does have osteoporosis but was not on osteoporosis "dosing" of these medications.  He saw Dr. Alinda [name]  On 12/15/2012 where he noted jaw pain, he was then referred to an oral surgeon in [name]. Per records XGEVA was discontinued. Discontinuation of ADT was also discussed at that visit but the patient was not interested in stopping therapy. He saw Dr. Enrique [name] in [name] and was diagnosed with ONJ.   He notes that he tries to eat solid foods and "stuff that ain't too hard." Describing his jaw, he adds "I need to get to a dentist." He would like to get his teeth out and would like a referral down to Dr. Enrique [name]. He says "that would help me and my heart." He notes that he's got  two stents and has already had two heart attacks, and that he knows that bad teeth aren't good for your heart.  He has not been referred to a pain doctor yet. The last he was told was he was on a good regimen that doesn't need to be changed. He confirms that he takes the 60's every 8 hours, and the oxycodone 10's for breakthrough.  He witnessed his brother die 2 months ago from a heart attack. Tried to save him; says he thought he knew enough to save him, but he didn't. His brother died at his house. He is very emotional when discussing his brother's death.  He notes that he doesn't really have questions today, and that the biggest thing he's been  worried about is that everything would get switched around and changed. He was advised that everything will be fine.  He has had a colonoscopy, noting "it hadn't been long." He notes that [name] did it, down in [name].   FROM [name]'s last VISIT AT [name]:  Past Medical History  Diagnosis Date  . COPD (chronic obstructive pulmonary disease) ([name])   . Sigmoid diverticulosis   . DDD (degenerative disc disease)   . Anxiety   . Depression   . Psoriasis   . Tubular adenoma     Colonoscopy 1/12  . Prostate cancer ([name])     Metastatic, stage IV  - Dr. Tressie [name]  . Internal hemorrhoids     Colonoscopy 1/12  . Drug reaction     Pamidronate - induced skin toxicity, Zometa - induced toxicity  . GERD (gastroesophageal reflux disease)   . Gastritis   . Osteoarthritis   . 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 Coronary artery disease     DES to LAD 1/12, has 2 stents  . Myocardial infarction ([name])     2012  . Essential hypertension   . Asthma   . Horseshoe kidney     Single kidney    SURGICAL HISTORY: Past Surgical History  Procedure Laterality Date  . Anterior fusion cervical spine    . Posterior fusion cervical spine    . Left foot surgery    . Prostatectomy  02/28/2006    Dr. Nevada [name] in [place], [name] [name].  Marland Kitchen Appendectomy    . Heart stents  06/2010    2  . Ethmoidectomy Left 09/28/2013    Procedure: LEFT ENDOSCOPIC ANTERIOR ETHMOIDECTOMY;  Surgeon: [name] Dike, MD;  Location: [facility];  Service: ENT;  Laterality: Left;  . Maxillary antrostomy Left 09/28/2013    Procedure: LEFT ENDOSCOPIC MAXILLARY ANTROSTOMY WITH REMOVAL OF TISSUE;  Surgeon: [name] Dike, MD;  Location: [facility];  Service: ENT;  Laterality: Left;    SOCIAL HISTORY: Social History   Social History  . Marital Status: Married    Spouse Name: N/A  . Number of Children: 3  . Years of Education: N/A   Occupational History  . Part-time, small  jobs     [employer], Engineer, production  .     Social History Main Topics  . Smoking status: Former Smoker -- 1.00 packs/day for 15 years    Types: Cigarettes    Quit date: 02/20/2006  . Smokeless tobacco: Never Used  . Alcohol Use: No     Comment: Quit in 2007. Previous 12 pack of beer per week.  . Drug Use: No  . Sexual Activity: Not on file   Other  Topics Concern  . Not on file   Social History Narrative   He lives with himself. Has family around here. Has a wife and 3 kids. Separated, but trying to get back together with his wife. She's with him and his sons right now. Says he misses her and his children badly, and wants it to work out. No grandchildren. Smoked and drank until they told him he had cancer; hasn't done either since 2007.  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  Says he probably makes 5,000 a year, and is living on disability.  FAMILY HISTORY: Family History  Problem Relation Age of Onset  . Alzheimer's disease Mother   . Lung cancer Father   . Heart attack Brother    Parents deceased. Mother was 64 when she died. Had Alzheimer's and dementia. Was in assisted living, had a stroke 2 months ago from which she didn't recover. Father died at age of 71; worked for [employer], [employer]; was transferred to [employer] Worked until he was 23, but he went straight from work to the hospital; found lung cancer. "It completely ate up one of his lungs." His cancer had gotten up into his heart as well. He went into surgery to take that lung out, and died the following 25-Aug-2022; never regained consciousness from the OR. Father was a smoker and worked in the [employer] 45 years.  Had 8 siblings 1 died 2 years ago with a heart attack. 1 brother lost 2 months ago from a heart attack; age 46. Still emotional about this. Sudden. He was at his house. His other brother died at the age of 39 or 11.  ALLERGIES:  is allergic to other; zoledronic acid;  and pamidronate.  MEDICATIONS:  Current Outpatient Prescriptions  Medication Sig Dispense Refill  . ALPRAZolam (XANAX) 0.5 MG tablet Take 1 tablet (0.5 mg total) by mouth 4 (four) times daily as needed for anxiety. 120 tablet 5  . aspirin EC 81 MG EC tablet Take 1 tablet (81 mg total) by mouth daily.    . calcipotriene (DOVONOX) 0.005 % cream Apply topically 2 (two) times daily.     . calcipotriene-betamethasone (TACLONEX) ointment Apply 1 application topically daily. 60 g 3  . CAPEX 0.01 % SHAM Apply 1 application topically as needed (psoriasis).     . chlorhexidine (PERIDEX) 0.12 % solution USE 15 ML BY MOUTH TO SWISH AND SPIT TWICE DAILY 473 mL 0  . Clobetasol Propionate (TEMOVATE) 0.05 % external spray APPLY EXTERNALLY TO THE AFFECTED AREA UP TO TWICE DAILY AS NEEDED 59 mL 0  . Clobetasol Propionate 0.05 % shampoo USE SHAMPOO AS NEEDED 118 mL 11  . Elastic Bandages & Supports (LUMBAR BACK BRACE/SUPPORT PAD) MISC 1 each by Does not apply route as directed. 1 each 1  . fluticasone (FLONASE) 50 MCG/ACT nasal spray Place 1 spray into both nostrils daily.  0  . gabapentin (NEURONTIN) 300 MG capsule TAKE 3 CAPSULES AT BEDTIME 90 capsule 3  . leuprolide, 6 Month, (ELIGARD) 45 MG injection Inject 45 mg into the skin every 6 (six) months.     . meclizine (ANTIVERT) 25 MG tablet Take 25 mg by mouth 3 (three) times daily as needed for dizziness. Reported on 11/22/2015    . metoprolol succinate (TOPROL-XL) 25 MG 24 hr tablet TAKE 1 TABLET BY MOUTH EVERY DAY 30 tablet 5  . mupirocin ointment (BACTROBAN) 2 % Apply 1 application topically 2 (two) times  daily. To rash 30 g 0  . nitroGLYCERIN (NITROSTAT) 0.4 MG SL tablet Place 1 tablet (0.4 mg total) under the tongue every 5 (five) minutes as needed for chest pain. 25 tablet 6  . Oxycodone HCl 10 MG TABS Take 1-2 tablets (10-20 mg total) by mouth every 4 (four) hours as needed. 180 tablet 0  . OxyCODONE HCl ER 60 MG T12A Take 60 mg by mouth every 8 (eight)  hours. 90 each 0  . pantoprazole (PROTONIX) 40 MG tablet TAKE 1 TABLET BY MOUTH DAILY 30 tablet 0  . pravastatin (PRAVACHOL) 80 MG tablet TAKE 1 TABLET(80 MG) BY MOUTH DAILY 30 tablet 5  . PROAIR HFA 108 (90 BASE) MCG/ACT inhaler inhale 1 puff every 4 hours WITH AEROCHAMBER 8.5 g 1  . sucralfate (CARAFATE) 1 g tablet Take 1 tablet po AC and HS 60 tablet 0   No current facility-administered medications for this visit.   Facility-Administered Medications Ordered in Other Visits  Medication Dose Route Frequency Provider Last Rate Last Dose  . Leuprolide Acetate (6 Month) (LUPRON) injection 45 mg  45 mg Intramuscular Once Baird Cancer, PA-C        Review of Systems  Constitutional: Positive for malaise/fatigue. Negative for chills, fever and weight loss.  HENT: Negative.  Negative for congestion, hearing loss, nosebleeds, sore throat and tinnitus.   Eyes: Negative.  Negative for blurred vision, double vision, pain and discharge.  Respiratory: Negative.  Negative for cough, hemoptysis, sputum production, shortness of breath and wheezing.   Cardiovascular: Negative.  Negative for chest pain, palpitations, claudication, leg swelling and PND.  Gastrointestinal: Negative.  Negative for abdominal pain, blood in stool, constipation, diarrhea, heartburn, melena, nausea and vomiting.  Genitourinary: Negative.  Negative for dysuria, frequency, hematuria and urgency.  Musculoskeletal: Positive for back pain. Negative for falls, joint pain and myalgias.  Skin: Negative.  Negative for itching and rash.  Neurological: Negative.  Negative for dizziness, tingling, tremors, sensory change, speech change, focal weakness, seizures, loss of consciousness, weakness and headaches.  Endo/Heme/Allergies: Negative.  Does not bruise/bleed easily.  Psychiatric/Behavioral: Negative.  Negative for depression, memory loss, substance abuse and suicidal ideas. The patient is not nervous/anxious and does not have insomnia.    All other systems reviewed and are negative.  14 point ROS was done and is otherwise as detailed above or in HPI   PHYSICAL EXAMINATION: ECOG PERFORMANCE STATUS: 1 - Symptomatic but completely ambulatory  Filed Vitals:   12/15/15 1544  BP: 139/74  Pulse: 58  Temp: 98.2 F (36.8 C)  Resp: 18   Filed Weights   12/15/15 1544  Weight: 162 lb 6.4 oz (73.664 kg)    Physical Exam  Constitutional: He is oriented to person, place, and time and well-developed, well-nourished, and in no distress.  HENT:  Head: Normocephalic and atraumatic.  Nose: Nose normal.  Mouth/Throat: Oropharynx is clear and moist. No oropharyngeal exudate.  Eyes: Conjunctivae and EOM are normal. Pupils are equal, round, and reactive to light. Right eye exhibits no discharge. Left eye exhibits no discharge. No scleral icterus.  Neck: Normal range of motion. Neck supple. No tracheal deviation present. No thyromegaly present.  Cardiovascular: Normal rate, regular rhythm and normal heart sounds.  Exam reveals no gallop and no friction rub.   No murmur heard. Pulmonary/Chest: Effort normal and breath sounds normal. He has no wheezes. He has no rales.  Abdominal: Soft. Bowel sounds are normal. He exhibits no distension and no mass. There is no tenderness.  There is no rebound and no guarding.  Musculoskeletal: Normal range of motion. He exhibits no edema.  Lymphadenopathy:    He has no cervical adenopathy.  Neurological: He is alert and oriented to person, place, and time. He has normal reflexes. No cranial nerve deficit. Gait normal. Coordination normal.  Skin: Skin is warm and dry. No rash noted.  Psychiatric: Mood, memory, affect and judgment normal.  Nursing note and vitals reviewed.   LABORATORY DATA:  I have reviewed the data as listed Results for [name], [name] (MRN [id number]) as of 12/15/2015 16:10  Ref. Range 12/15/2015 15:20  Sodium Latest Ref Range: 135-145 mmol/L 133 (L)  Potassium Latest Ref Range:  3.5-5.1 mmol/L 3.3 (L)  Chloride Latest Ref Range: 101-111 mmol/L 101  CO2 Latest Ref Range: 22-32 mmol/L 27  BUN Latest Ref Range: 6-20 mg/dL 8  Creatinine Latest Ref Range: 0.61-1.24 mg/dL 0.68  Calcium Latest Ref Range: 8.9-10.3 mg/dL 8.7 (L)  EGFR (Non-African Amer.) Latest Ref Range: >60 mL/min >60  EGFR (African American) Latest Ref Range: >60 mL/min >60  Glucose Latest Ref Range: 65-99 mg/dL 106 (H)  Anion gap Latest Ref Range: 5-15  5  Alkaline Phosphatase Latest Ref Range: 38-126 U/L 61  Albumin Latest Ref Range: 3.5-5.0 g/dL 4.4  AST Latest Ref Range: 15-41 U/L 25  ALT Latest Ref Range: 17-63 U/L 14 (L)  Total Protein Latest Ref Range: 6.5-8.1 g/dL 7.0  Total Bilirubin Latest Ref Range: 0.3-1.2 mg/dL 0.5  WBC Latest Ref Range: 4.0-10.5 K/uL 8.0  RBC Latest Ref Range: 4.22-5.81 MIL/uL 4.03 (L)  Hemoglobin Latest Ref Range: 13.0-17.0 g/dL 12.0 (L)  HCT Latest Ref Range: 39.0-52.0 % 34.8 (L)  MCV Latest Ref Range: 78.0-100.0 fL 86.4  MCH Latest Ref Range: 26.0-34.0 pg 29.8  MCHC Latest Ref Range: 30.0-36.0 g/dL 34.5  RDW Latest Ref Range: 11.5-15.5 % 13.2  Platelets Latest Ref Range: 150-400 K/uL 284  Neutrophils Latest Units: % 66  Lymphocytes Latest Units: % 21  Monocytes Relative Latest Units: % 12  Eosinophil Latest Units: % 1  Basophil Latest Units: % 0  NEUT# Latest Ref Range: 1.7-7.7 K/uL 5.2  Lymphocyte # Latest Ref Range: 0.7-4.0 K/uL 1.7  Monocyte # Latest Ref Range: 0.1-1.0 K/uL 1.0  Eosinophils Absolute Latest Ref Range: 0.0-0.7 K/uL 0.1  Basophils Absolute Latest Ref Range: 0.0-0.1 K/uL 0.0   Results for [name], [name] (MRN [id number])   Ref. Range 01/15/2013 11:21 03/17/2013 10:01 06/11/2013 11:18 09/03/2013 12:05 12/15/2015 15:20  PSA Latest Ref Range: 0.00 - 4.00 ng/mL <0.01 (L) <0.01 (L) <0.01 (L) <0.01 (L) <0.01       RADIOGRAPHIC STUDIES: I have personally reviewed the radiological images as listed and agreed with the findings in the report. No  results found.  ASSESSMENT & PLAN:  Prostate CA [facility])   Staging form: Prostate, AJCC 7th Edition     Clinical: Stage IV (T1c, N1, M1b) - Signed by Baird Cancer, PA on 12/29/2010 ONJ Chronic Pain, back History Osteoporosis Anxiety  We discussed his ongoing therapy.  I advised him that I did not intend on changing his therapy.  He receives a lot of short acting pain medication monthly and I would certainly prefer that he went up on his long acting and decrease his short acting use but we will not change this now.  I will refer him to Dr. Enrique [name] per his request. I am not sure if he is eligible for dental extractions given his history.   I have  refilled his requested medications today.   He's been on a six month follow-up schedule with Dr. Tressie [name], with bloodwork every month. He would like to stay on this schedule.  We will keep up with his monthly blood work and check up on him in 3 months to get to know him better. He is agreeable.   Orders Placed This Encounter  Procedures  . CBC with Differential    Standing Status:   Future    Standing Expiration Date:   12/14/2016  . Comprehensive metabolic panel    Standing Status:   Future    Standing Expiration Date:   12/14/2016  . PSA    Standing Status:   Future    Standing Expiration Date:   12/14/2016    All questions were answered. The patient knows to call the clinic with any problems, questions or concerns.  This document serves as a record of services personally performed by Ancil Linsey, MD. It was created on her behalf by Toni Amend, a trained medical scribe. The creation of this record is based on the scribe's personal observations and the provider's statements to them. This document has been checked and approved by the attending provider.  I have reviewed the above documentation for accuracy and completeness and I agree with the above.  This note was electronically signed.    Molli Hazard,  MD  12/15/2015 4:11 PM

## 2015-12-16 ENCOUNTER — Telehealth: Payer: Self-pay | Admitting: Family Medicine

## 2015-12-16 LAB — TESTOSTERONE, FREE: Testosterone, Free: 0.9 pg/mL — ABNORMAL LOW (ref 6.6–18.1)

## 2015-12-16 LAB — TESTOSTERONE: Testosterone: 8 ng/dL — ABNORMAL LOW (ref 348–1197)

## 2015-12-16 NOTE — Telephone Encounter (Signed)
Called to discuss symptoms and pt states he had already talked to dr penland about this issue and does not need anything from our office at this time.

## 2015-12-16 NOTE — Telephone Encounter (Signed)
Pt states he is bloated and gassy Wants to know if you can call in something or if he can go get something OTC That will help   Please advise, he would like an answer today or as soon as possible  He is uncomfortable and can't get the gas to pass   [place]

## 2015-12-16 NOTE — Telephone Encounter (Signed)
Nurse's-please discuss with the patient his symptoms any fever? Nausea or vomiting? Last bowel movement was when? Was bowel movements soft or hard? Does typically patient have bowel movements on a daily basis? Is the pain severe or just more crampy? Tenderness in the abdomen?

## 2015-12-20 LAB — TESTOSTERONE, % FREE: Testosterone-% Free: 0.6 % — ABNORMAL LOW (ref 0.2–0.7)

## 2015-12-22 ENCOUNTER — Telehealth (HOSPITAL_COMMUNITY): Payer: Self-pay | Admitting: Oncology

## 2015-12-22 NOTE — Telephone Encounter (Signed)
[name] called.  He reports that the patient has 4-5 lower teeth that need to be extracted but given the high risk for ONJ, he needs to see a well trained oral surgeon.  [name] notes that the patient has been previously evaluated for extraction at [facility], but the patient was not pleased with the options provided to him, likely due to cost.  The patient's insurance will not cover extraction procedure and therefore, the patient will have an out-of-pocket cost.  He recommended Dr. Jodene Nam or a new oral surgeon at [facility], Dr. Carmelia Roller (phone # [phone], fax # [phone]).  Unfortunately, any option the patient chooses will cost him money.  Doy Mince  12/22/2015 6:36 PM

## 2015-12-26 ENCOUNTER — Other Ambulatory Visit: Payer: Self-pay | Admitting: Family Medicine

## 2016-01-10 ENCOUNTER — Other Ambulatory Visit: Payer: Self-pay | Admitting: Family Medicine

## 2016-01-14 ENCOUNTER — Encounter (HOSPITAL_COMMUNITY): Payer: Self-pay | Admitting: Hematology & Oncology

## 2016-01-16 ENCOUNTER — Other Ambulatory Visit (HOSPITAL_COMMUNITY): Payer: Self-pay

## 2016-01-20 ENCOUNTER — Encounter (HOSPITAL_COMMUNITY): Payer: PPO | Attending: Hematology & Oncology

## 2016-01-20 DIAGNOSIS — C61 Malignant neoplasm of prostate: Secondary | ICD-10-CM | POA: Insufficient documentation

## 2016-01-20 DIAGNOSIS — M8710 Osteonecrosis due to drugs, unspecified bone: Secondary | ICD-10-CM | POA: Diagnosis not present

## 2016-01-20 LAB — CBC WITH DIFFERENTIAL/PLATELET
Basophils Absolute: 0 10*3/uL (ref 0.0–0.1)
Basophils Relative: 0 %
Eosinophils Absolute: 0.1 10*3/uL (ref 0.0–0.7)
Eosinophils Relative: 1 %
HCT: 38.6 % — ABNORMAL LOW (ref 39.0–52.0)
Hemoglobin: 12.8 g/dL — ABNORMAL LOW (ref 13.0–17.0)
Lymphocytes Relative: 31 %
Lymphs Abs: 2.8 10*3/uL (ref 0.7–4.0)
MCH: 29.5 pg (ref 26.0–34.0)
MCHC: 33.2 g/dL (ref 30.0–36.0)
MCV: 88.9 fL (ref 78.0–100.0)
Monocytes Absolute: 0.8 10*3/uL (ref 0.1–1.0)
Monocytes Relative: 9 %
Neutro Abs: 5.4 10*3/uL (ref 1.7–7.7)
Neutrophils Relative %: 59 %
Platelets: 281 10*3/uL (ref 150–400)
RBC: 4.34 MIL/uL (ref 4.22–5.81)
RDW: 13.3 % (ref 11.5–15.5)
WBC: 9.2 10*3/uL (ref 4.0–10.5)

## 2016-01-20 LAB — COMPREHENSIVE METABOLIC PANEL
ALT: 14 U/L — ABNORMAL LOW (ref 17–63)
AST: 23 U/L (ref 15–41)
Albumin: 4.6 g/dL (ref 3.5–5.0)
Alkaline Phosphatase: 64 U/L (ref 38–126)
Anion gap: 4 — ABNORMAL LOW (ref 5–15)
BUN: 7 mg/dL (ref 6–20)
CO2: 28 mmol/L (ref 22–32)
Calcium: 8.8 mg/dL — ABNORMAL LOW (ref 8.9–10.3)
Chloride: 106 mmol/L (ref 101–111)
Creatinine, Ser: 0.88 mg/dL (ref 0.61–1.24)
GFR calc Af Amer: >60 mL/min (ref >60)
GFR calc non Af Amer: >60 mL/min (ref >60)
Glucose, Bld: 104 mg/dL — ABNORMAL HIGH (ref 65–99)
Potassium: 4.1 mmol/L (ref 3.5–5.1)
Sodium: 138 mmol/L (ref 135–145)
Total Bilirubin: 0.5 mg/dL (ref 0.3–1.2)
Total Protein: 7.3 g/dL (ref 6.5–8.1)

## 2016-01-20 LAB — PSA: PSA: <0.01 ng/mL (ref 0.00–4.00)

## 2016-01-23 ENCOUNTER — Telehealth (HOSPITAL_COMMUNITY): Payer: Self-pay | Admitting: *Deleted

## 2016-01-24 ENCOUNTER — Telehealth (HOSPITAL_COMMUNITY): Payer: Self-pay

## 2016-01-24 DIAGNOSIS — M8710 Osteonecrosis due to drugs, unspecified bone: Secondary | ICD-10-CM

## 2016-01-24 DIAGNOSIS — C61 Malignant neoplasm of prostate: Secondary | ICD-10-CM

## 2016-01-24 MED ORDER — OXYCODONE HCL 10 MG PO TABS: 10.0000 mg | ORAL_TABLET | ORAL | 0 refills | Status: DC | PRN

## 2016-01-24 MED ORDER — OXYCODONE HCL ER 60 MG PO T12A: 60.0000 mg | EXTENDED_RELEASE_TABLET | Freq: Three times a day (TID) | ORAL | 0 refills | Status: DC

## 2016-01-24 NOTE — Telephone Encounter (Signed)
Patient called for refill on pain medication. Chart checked and refilled per protocol. 

## 2016-01-31 ENCOUNTER — Other Ambulatory Visit: Payer: Self-pay | Admitting: Family Medicine

## 2016-01-31 ENCOUNTER — Ambulatory Visit: Payer: Self-pay | Admitting: Gastroenterology

## 2016-02-03 ENCOUNTER — Telehealth: Payer: Self-pay | Admitting: Family Medicine

## 2016-02-03 MED ORDER — SUCRALFATE 1 G PO TABS: ORAL_TABLET | ORAL | 5 refills | Status: DC

## 2016-02-03 NOTE — Telephone Encounter (Signed)
Ok six ref 

## 2016-02-03 NOTE — Telephone Encounter (Signed)
Patient had to change his appointment with gastro doctor Dr. Ardis Hughs to April 25, 2016.  He is hoping Dr. [name] will refill the sucralfate (CARAFATE) 1 g tablet until that appointment.  It has really helped him and he feels a lot better.  [facility]

## 2016-02-03 NOTE — Telephone Encounter (Signed)
Spoke with patient an informed him per Dr.Steve Luking- We are going to send in Carafate with 6 refills. Patient verbalized understanding.

## 2016-02-07 ENCOUNTER — Ambulatory Visit (HOSPITAL_COMMUNITY): Payer: Self-pay | Admitting: Hematology & Oncology

## 2016-02-13 ENCOUNTER — Telehealth: Payer: Self-pay | Admitting: Cardiology

## 2016-02-13 NOTE — Telephone Encounter (Signed)
Wants to know if he still needs to be taking pravastatin (PRAVACHOL) 80 MG tablet   g

## 2016-02-13 NOTE — Telephone Encounter (Signed)
Will forward to DOD as Dr Domenic Polite in [place]

## 2016-02-14 NOTE — Telephone Encounter (Signed)
Pt will stay on pravastatin after call back with MD info

## 2016-02-14 NOTE — Telephone Encounter (Signed)
He needs to stay on pravachol. It is strongly recommended that anyone with history of blockages in the arteries be on a medicine like pravachol even if there cholesterol numbers look good. These medicines work in several other ways to help prevent future blockages   J Kimerly Rowand MD

## 2016-02-15 ENCOUNTER — Other Ambulatory Visit (HOSPITAL_COMMUNITY): Payer: Self-pay

## 2016-02-15 DIAGNOSIS — C61 Malignant neoplasm of prostate: Secondary | ICD-10-CM

## 2016-02-15 DIAGNOSIS — E876 Hypokalemia: Secondary | ICD-10-CM

## 2016-02-15 MED ORDER — POTASSIUM CHLORIDE CRYS ER 20 MEQ PO TBCR: 20.0000 meq | EXTENDED_RELEASE_TABLET | Freq: Two times a day (BID) | ORAL | 1 refills | Status: DC

## 2016-02-15 NOTE — Telephone Encounter (Signed)
Received refill request for Potassium. Chart checked and refilled per PA.

## 2016-02-16 ENCOUNTER — Encounter (HOSPITAL_COMMUNITY): Payer: PPO | Attending: Hematology & Oncology

## 2016-02-16 DIAGNOSIS — C61 Malignant neoplasm of prostate: Secondary | ICD-10-CM | POA: Diagnosis not present

## 2016-02-16 LAB — COMPREHENSIVE METABOLIC PANEL
ALT: 10 U/L — ABNORMAL LOW (ref 17–63)
AST: 17 U/L (ref 15–41)
Albumin: 4.3 g/dL (ref 3.5–5.0)
Alkaline Phosphatase: 66 U/L (ref 38–126)
Anion gap: 5 (ref 5–15)
BUN: 8 mg/dL (ref 6–20)
CO2: 30 mmol/L (ref 22–32)
Calcium: 9 mg/dL (ref 8.9–10.3)
Chloride: 106 mmol/L (ref 101–111)
Creatinine, Ser: 0.94 mg/dL (ref 0.61–1.24)
GFR calc Af Amer: >60 mL/min (ref >60)
GFR calc non Af Amer: >60 mL/min (ref >60)
Glucose, Bld: 107 mg/dL — ABNORMAL HIGH (ref 65–99)
Potassium: 4.4 mmol/L (ref 3.5–5.1)
Sodium: 141 mmol/L (ref 135–145)
Total Bilirubin: 0.3 mg/dL (ref 0.3–1.2)
Total Protein: 6.6 g/dL (ref 6.5–8.1)

## 2016-02-16 LAB — CBC WITH DIFFERENTIAL/PLATELET
Basophils Absolute: 0.1 10*3/uL (ref 0.0–0.1)
Basophils Relative: 1 %
Eosinophils Absolute: 0.4 10*3/uL (ref 0.0–0.7)
Eosinophils Relative: 6 %
HCT: 37.5 % — ABNORMAL LOW (ref 39.0–52.0)
Hemoglobin: 12.1 g/dL — ABNORMAL LOW (ref 13.0–17.0)
Lymphocytes Relative: 32 %
Lymphs Abs: 2.3 10*3/uL (ref 0.7–4.0)
MCH: 28.9 pg (ref 26.0–34.0)
MCHC: 32.3 g/dL (ref 30.0–36.0)
MCV: 89.7 fL (ref 78.0–100.0)
Monocytes Absolute: 0.6 10*3/uL (ref 0.1–1.0)
Monocytes Relative: 9 %
Neutro Abs: 3.8 10*3/uL (ref 1.7–7.7)
Neutrophils Relative %: 52 %
Platelets: 248 10*3/uL (ref 150–400)
RBC: 4.18 MIL/uL — ABNORMAL LOW (ref 4.22–5.81)
RDW: 13.1 % (ref 11.5–15.5)
WBC: 7.2 10*3/uL (ref 4.0–10.5)

## 2016-02-16 LAB — PSA: PSA: <0.01 ng/mL (ref 0.00–4.00)

## 2016-02-21 ENCOUNTER — Other Ambulatory Visit (HOSPITAL_COMMUNITY): Payer: Self-pay | Admitting: Oncology

## 2016-02-21 DIAGNOSIS — C61 Malignant neoplasm of prostate: Secondary | ICD-10-CM

## 2016-02-21 DIAGNOSIS — M8710 Osteonecrosis due to drugs, unspecified bone: Secondary | ICD-10-CM

## 2016-02-21 MED ORDER — OXYCODONE HCL 10 MG PO TABS: 10.0000 mg | ORAL_TABLET | ORAL | 0 refills | Status: DC | PRN

## 2016-02-21 MED ORDER — OXYCODONE HCL ER 60 MG PO T12A: 60.0000 mg | EXTENDED_RELEASE_TABLET | Freq: Three times a day (TID) | ORAL | 0 refills | Status: DC

## 2016-03-12 ENCOUNTER — Telehealth: Payer: Self-pay | Admitting: Family Medicine

## 2016-03-12 NOTE — Telephone Encounter (Signed)
Oct 20 fine, no flu in region yet, regular flu shot is wstrong and indicated

## 2016-03-12 NOTE — Telephone Encounter (Signed)
Patient called to schedule his flu shot today, but our next available is March 23, 2016.  He is a cancer patient and is concerned that he may need to get this ASAP and wants to know what Dr. [name] thinks.  Also, he would like to know which shot he will need.

## 2016-03-12 NOTE — Telephone Encounter (Signed)
Patient said he has an appointment on October 13 with his cancer doctor and is going to see if he can get the shot there.  If not, he will call us back.

## 2016-03-16 ENCOUNTER — Ambulatory Visit (HOSPITAL_COMMUNITY): Payer: Self-pay | Admitting: Hematology & Oncology

## 2016-03-16 ENCOUNTER — Encounter (HOSPITAL_COMMUNITY): Payer: Self-pay | Admitting: Oncology

## 2016-03-16 ENCOUNTER — Encounter (HOSPITAL_COMMUNITY): Payer: PPO | Attending: Hematology & Oncology

## 2016-03-16 ENCOUNTER — Encounter (HOSPITAL_BASED_OUTPATIENT_CLINIC_OR_DEPARTMENT_OTHER): Payer: PPO | Admitting: Oncology

## 2016-03-16 VITALS — BP 130/90 | HR 61 | Temp 98.1°F | Resp 16 | Wt 164.2 lb

## 2016-03-16 DIAGNOSIS — Z87891 Personal history of nicotine dependence: Secondary | ICD-10-CM | POA: Diagnosis not present

## 2016-03-16 DIAGNOSIS — Z23 Encounter for immunization: Secondary | ICD-10-CM

## 2016-03-16 DIAGNOSIS — M8710 Osteonecrosis due to drugs, unspecified bone: Secondary | ICD-10-CM

## 2016-03-16 DIAGNOSIS — C61 Malignant neoplasm of prostate: Secondary | ICD-10-CM | POA: Insufficient documentation

## 2016-03-16 DIAGNOSIS — Z0000 Encounter for general adult medical examination without abnormal findings: Secondary | ICD-10-CM

## 2016-03-16 LAB — CBC WITH DIFFERENTIAL/PLATELET
Basophils Absolute: 0.1 10*3/uL (ref 0.0–0.1)
Basophils Relative: 1 %
Eosinophils Absolute: 0.3 10*3/uL (ref 0.0–0.7)
Eosinophils Relative: 3 %
HCT: 37.5 % — ABNORMAL LOW (ref 39.0–52.0)
Hemoglobin: 12.3 g/dL — ABNORMAL LOW (ref 13.0–17.0)
Lymphocytes Relative: 28 %
Lymphs Abs: 2.6 10*3/uL (ref 0.7–4.0)
MCH: 29.4 pg (ref 26.0–34.0)
MCHC: 32.8 g/dL (ref 30.0–36.0)
MCV: 89.5 fL (ref 78.0–100.0)
Monocytes Absolute: 0.8 10*3/uL (ref 0.1–1.0)
Monocytes Relative: 8 %
Neutro Abs: 5.8 10*3/uL (ref 1.7–7.7)
Neutrophils Relative %: 60 %
Platelets: 277 10*3/uL (ref 150–400)
RBC: 4.19 MIL/uL — ABNORMAL LOW (ref 4.22–5.81)
RDW: 13.4 % (ref 11.5–15.5)
WBC: 9.5 10*3/uL (ref 4.0–10.5)

## 2016-03-16 LAB — COMPREHENSIVE METABOLIC PANEL
ALT: 13 U/L — ABNORMAL LOW (ref 17–63)
AST: 18 U/L (ref 15–41)
Albumin: 4.6 g/dL (ref 3.5–5.0)
Alkaline Phosphatase: 65 U/L (ref 38–126)
Anion gap: 7 (ref 5–15)
BUN: 6 mg/dL (ref 6–20)
CO2: 28 mmol/L (ref 22–32)
Calcium: 9.4 mg/dL (ref 8.9–10.3)
Chloride: 106 mmol/L (ref 101–111)
Creatinine, Ser: 0.87 mg/dL (ref 0.61–1.24)
GFR calc Af Amer: >60 mL/min (ref >60)
GFR calc non Af Amer: >60 mL/min (ref >60)
Glucose, Bld: 112 mg/dL — ABNORMAL HIGH (ref 65–99)
Potassium: 4.5 mmol/L (ref 3.5–5.1)
Sodium: 141 mmol/L (ref 135–145)
Total Bilirubin: 0.4 mg/dL (ref 0.3–1.2)
Total Protein: 7.2 g/dL (ref 6.5–8.1)

## 2016-03-16 LAB — PSA: PSA: <0.01 ng/mL (ref 0.00–4.00)

## 2016-03-16 MED ORDER — OXYCODONE HCL 10 MG PO TABS: 10.0000 mg | ORAL_TABLET | ORAL | 0 refills | Status: DC | PRN

## 2016-03-16 MED ORDER — OXYCODONE HCL ER 60 MG PO T12A: 60.0000 mg | EXTENDED_RELEASE_TABLET | Freq: Three times a day (TID) | ORAL | 0 refills | Status: DC

## 2016-03-16 MED ORDER — INFLUENZA VAC SPLIT QUAD 0.5 ML IM SUSY
0.5000 mL | PREFILLED_SYRINGE | Freq: Once | INTRAMUSCULAR | Status: AC
  Administered 2016-03-16: 0.5 mL via INTRAMUSCULAR

## 2016-03-16 MED ORDER — INFLUENZA VAC SPLIT QUAD 0.5 ML IM SUSY
PREFILLED_SYRINGE | INTRAMUSCULAR | Status: AC
  Filled 2016-03-16: qty 0.5

## 2016-03-16 NOTE — Assessment & Plan Note (Addendum)
Adenocarcinoma of prostate, on Eligard every 6 months x many years with initial surgery performed by [name] in [facility], [place] showing 2/22 positive lymph nodes.  [name] was on bone protecting medication, but developed ONJ and therefore these medications were discontinued.  Oncology history is up-to-date.  Labs today: CBC diff, CMET, PSA.  I personally reviewed and went over laboratory results with the patient.  The results are noted within this dictation.  Labs monthly: CBC diff, CMET, PSA.  Next Eligard injection is due in 3 months, last given in July.  I have refilled his pain medications in the typical fashion.  [place] Controlled Substance Reporting System is reviewed in detail.  His Oxycontin and Oxycodone were both filled last on 02/26/2016.  [name] is on more short-acting pain medication that what is reasonable and we would prefer him to increase his long-acting pain medication.  However, [name] has been on this current regimen for years by his previous oncologist and therefore, we will not change at this time.  Influenza vaccine is given today.  Return in 3 months for follow-up.  If all is well at that time, we can consider spacing out appointments to every 6 months.

## 2016-03-16 NOTE — Progress Notes (Signed)
Gareth Eagle presents today for injection per MD orders. Flu Vaccine administered IM in left Upper Arm. Administration without incident. Patient tolerated well.

## 2016-03-16 NOTE — Progress Notes (Signed)
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 male returns for followup of adenocarcinoma of prostate, on Eligard every 6 months x many years with initial surgery performed by Dr. Nevada [name] in [facility], [place] showing 2/22 positive lymph nodes.  He was on bone protecting medication, but developed ONJ and therefore these medications were discontinued.    Prostate CA ([place])   02/28/2006 Surgery    Radical prostatectomy with Dr. Nevada [name], LN involvement. Final staging stage IV , T1c, N1      02/28/2006 Pathology Results    Poorly Differentiated Adenocarcinoma, GLEASON' S SCORE 4+4=8      06/06/2009 - 12/16/2012 Chemotherapy    XGEVA,  according to records was used Q28 days for "bone protection" but no documented bone metastases (Prior therapy with pamidronate and zometa)      09/16/2013 Imaging    MRI soft tissue mass filling the left maxillary sinuses and L anterior ethmoid air cells, lesion 4.5 x 4 x 6.2 cm. Evidence of involvement of anterior wall of L maxillary sinus      09/28/2013 Surgery    Left endoscopic maxillary antrostomy with tissue removal. Left endoscopic anterior ethmoidectomy      09/28/2013 Pathology Results    No evidence of malignancy      12/07/2013 Imaging    DEXA with osteopenia      He is doing well.  He has no new complaints.  He recently purchased a new house with 14 acres of land and a stream.  He reports that he was trying to  cross the stream and fell.  He has a few cuts and bruises on his right arm.  All are clean and improving without any signs of infection.  Review of Systems  Constitutional: Negative.  Negative for chills, fever, malaise/fatigue and weight loss.  HENT: Negative.   Eyes: Negative.  Negative for blurred vision.  Respiratory: Negative.  Negative for cough.   Cardiovascular: Negative.  Negative for chest pain.  Gastrointestinal: Negative.  Negative for constipation, diarrhea, nausea and vomiting.  Genitourinary: Negative.   Musculoskeletal: Positive for falls.  Skin: Negative.  Negative for rash.  Neurological: Negative.  Negative for weakness and headaches.  Endo/Heme/Allergies: Negative.   Psychiatric/Behavioral: Negative.     Past Medical History:  Diagnosis Date  . Anxiety   . Asthma   . COPD (chronic obstructive pulmonary disease) ([place])   . Coronary artery disease    DES to LAD 1/12, has 2 stents  . DDD (degenerative disc disease)   . Depression   . Drug reaction    Pamidronate - induced skin toxicity, Zometa - induced toxicity  . Essential hypertension   . Gastritis   . GERD (gastroesophageal reflux disease)   . Horseshoe kidney    Single kidney  . Internal hemorrhoids    Colonoscopy 1/12  . Myocardial infarction    2012  . Osteoarthritis   . 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 Prostate cancer ([place])    Metastatic, stage IV  - [name]  . Psoriasis   . Sigmoid diverticulosis   . Tubular adenoma    Colonoscopy 1/12    Past Surgical History:  Procedure Laterality Date  . ANTERIOR FUSION CERVICAL SPINE    . APPENDECTOMY    . ETHMOIDECTOMY Left 09/28/2013   Procedure: LEFT ENDOSCOPIC ANTERIOR ETHMOIDECTOMY;  Surgeon: [name] Dike, MD;  Location: [place];  Service: ENT;  Laterality: Left;  . HEART STENTS  06/2010   2  . LEFT FOOT SURGERY    . MAXILLARY ANTROSTOMY Left 09/28/2013   Procedure: LEFT ENDOSCOPIC MAXILLARY ANTROSTOMY WITH REMOVAL  OF TISSUE;  Surgeon: [name] Dike, MD;  Location: [place];  Service: ENT;  Laterality: Left;  . POSTERIOR FUSION CERVICAL SPINE    . PROSTATECTOMY  02/28/2006   Dr. Nevada [name] in [place], [place].    Family History  Problem Relation Age of Onset  . Lung cancer Father   . Heart attack Brother   . Alzheimer's disease Mother     Social History   Social History  . Marital status: Married    Spouse name: N/A  . Number of children: 3  . Years of education: N/A   Occupational History  . 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  Unemployed   Social History Main Topics  . Smoking status: Former Smoker    Packs/day: 1.00    Years: 15.00    Types: Cigarettes    Quit date: 02/20/2006  . Smokeless tobacco: Never Used  . Alcohol use No     Comment: Quit in 2007. Previous 12 pack of beer per week.  . Drug use: No  . Sexual activity: Not Asked   Other Topics Concern  . None   Social History Narrative  . None     PHYSICAL EXAMINATION  ECOG PERFORMANCE STATUS: 1 - Symptomatic but completely ambulatory  Vitals:   03/16/16 1312  BP: 130/90  Pulse: 61  Resp: 16  Temp: 98.1 F (36.7 C)    GENERAL:alert, no distress, comfortable, cooperative, smiling and chronically ill appearing, unaccompanied. SKIN: skin color, texture, turgor are normal, no rashes or significant lesions HEAD: Normocephalic, No masses, lesions, tenderness or abnormalities EYES: normal, EOMI, Conjunctiva are pink and non-injected EARS: External ears normal OROPHARYNX:lips, buccal mucosa, and tongue normal and mucous membranes are moist  NECK: supple, trachea midline LYMPH:  no palpable lymphadenopathy BREAST:not examined LUNGS: clear to auscultation  HEART: regular rate & rhythm, no murmurs, no gallops, S1 normal and S2 normal ABDOMEN:abdomen soft and normal bowel sounds BACK: Back symmetric, no curvature. EXTREMITIES:less then 2 second capillary refill, no joint deformities, effusion,  or inflammation, no skin discoloration, no cyanosis, positive findings:  Right upper arm with healing open laceration, right upper arm healing ecchymosis, and right lower arm laceration healing.  NEURO: alert & oriented x 3 with fluent speech, no focal motor/sensory deficits, gait normal   LABORATORY DATA: CBC    Component Value Date/Time   WBC 9.5 03/16/2016 1256   RBC 4.19 (L) 03/16/2016 1256   HGB 12.3 (L) 03/16/2016 1256   HCT 37.5 (L) 03/16/2016 1256   PLT 277 03/16/2016 1256   MCV 89.5 03/16/2016 1256   MCH 29.4 03/16/2016 1256   MCHC 32.8 03/16/2016 1256   RDW 13.4 03/16/2016 1256   LYMPHSABS 2.6 03/16/2016 1256   MONOABS 0.8 03/16/2016 1256  EOSABS 0.3 03/16/2016 1256   BASOSABS 0.1 03/16/2016 1256      Chemistry      Component Value Date/Time   NA 141 03/16/2016 1256   NA 142 10/17/2015 1450   K 4.5 03/16/2016 1256   CL 106 03/16/2016 1256   CO2 28 03/16/2016 1256   BUN 6 03/16/2016 1256   BUN 7 (L) 10/17/2015 1450   CREATININE 0.87 03/16/2016 1256      Component Value Date/Time   CALCIUM 9.4 03/16/2016 1256   ALKPHOS 65 03/16/2016 1256   AST 18 03/16/2016 1256   ALT 13 (L) 03/16/2016 1256   BILITOT 0.4 03/16/2016 1256   BILITOT <0.2 10/17/2015 1450     Lab Results  Component Value Date   PSA <0.01 03/16/2016   PSA <0.01 02/16/2016   PSA <0.01 01/20/2016     PENDING LABS:   RADIOGRAPHIC STUDIES:  No results found.   PATHOLOGY:    ASSESSMENT AND PLAN:  Prostate CA [facility]) Adenocarcinoma of prostate, on Eligard every 6 months x many years with initial surgery performed by Dr. Nevada [name] in [facility], [place] showing 2/22 positive lymph nodes.  He was on bone protecting medication, but developed ONJ and therefore these medications were discontinued.  Oncology history is up-to-date.  Labs today: CBC diff, CMET, PSA.  I personally reviewed and went over laboratory results with the patient.  The results are noted within this dictation.  Labs monthly: CBC  diff, CMET, PSA.  Next Eligard injection is due in 3 months, last given in July.  I have refilled his pain medications in the typical fashion.  [place] Controlled Substance Reporting System is reviewed in detail.  His Oxycontin and Oxycodone were both filled last on 02/26/2016.  He is on more short-acting pain medication that what is reasonable and we would prefer him to increase his long-acting pain medication.  However, he has been on this current regimen for years by his previous oncologist and therefore, we will not change at this time.  Influenza vaccine is given today.  Return in 3 months for follow-up.  If all is well at that time, we can consider spacing out appointments to every 6 months.     ORDERS PLACED FOR THIS ENCOUNTER: No orders of the defined types were placed in this encounter.   MEDICATIONS PRESCRIBED THIS ENCOUNTER: Meds ordered this encounter  Medications  . DISCONTD: oxyCODONE (OXYCONTIN) 60 MG 12 hr tablet    Sig: Take 60 mg by mouth every 8 (eight) hours.    Dispense:  90 each    Refill:  0    Fill on Fill on 03/24/2016    Order Specific Question:   Supervising Provider    Answer:   Patrici Ranks [id number]  . DISCONTD: Oxycodone HCl 10 MG TABS    Sig: Take 1-2 tablets (10-20 mg total) by mouth every 4 (four) hours as needed.    Dispense:  180 tablet    Refill:  0    Fill on 03/24/2016    Order Specific Question:   Supervising Provider    Answer:   Patrici Ranks [id number]  . Influenza vac split quadrivalent PF (FLUARIX) injection 0.5 mL  . oxyCODONE (OXYCONTIN) 60 MG 12 hr tablet    Sig: Take 60 mg by mouth every 8 (eight) hours.    Dispense:  90 each    Refill:  0    Fill on Fill on 03/24/2016    Order Specific Question:  Supervising Provider    Answer:   Patrici Ranks [id number]  . Oxycodone HCl 10 MG TABS    Sig: Take 1-2 tablets (10-20 mg total) by mouth every 4 (four) hours as needed.    Dispense:  180 tablet    Refill:  0      Fill on 03/24/2016    Order Specific Question:   Supervising Provider    Answer:   Patrici Ranks [id number]    THERAPY PLAN:  Continue Eligard every 6 months.  All questions were answered. The patient knows to call the clinic with any problems, questions or concerns. We can certainly see the patient much sooner if necessary.  Patient and plan discussed with Dr. Ancil Linsey and she is in agreement with the aforementioned.   This note is electronically signed by: Doy Mince 03/16/2016 5:28 PM

## 2016-03-16 NOTE — Patient Instructions (Addendum)
[place] at [facility] Discharge Instructions  RECOMMENDATIONS MADE BY THE CONSULTANT AND ANY TEST RESULTS WILL BE SENT TO YOUR REFERRING PHYSICIAN.   You saw Kirby Crigler, PA-C today. You had Flu shot today. Lab work monthly. Lupron Injection in 3 months. Follow up with MD in 3 months.  Thank you for choosing [place] at [facility] to provide your oncology and hematology care.  To afford each patient quality time with our provider, please arrive at least 15 minutes before your scheduled appointment time.   Beginning January 23rd 2017 lab work for the [facility] will be done in the  Main lab at [facility] on 1st floor. If you have a lab appointment with the [facility] please come in thru the  Main Entrance and check in at the main information desk  You need to re-schedule your appointment should you arrive 10 or more minutes late.  We strive to give you quality time with our providers, and arriving late affects you and other patients whose appointments are after yours.  Also, if you no show three or more times for appointments you may be dismissed from the clinic at the providers discretion.     Again, thank you for choosing [facility].  Our hope is that these requests will decrease the amount of time that you wait before being seen by our physicians.       _____________________________________________________________  Should you have questions after your visit to [facility], please contact our office at [phone] between the hours of 8:30 a.m. and 4:30 p.m.  Voicemails left after 4:30 p.m. will not be returned until the following business day.  For prescription refill requests, have your pharmacy contact our office.         Resources For Cancer Patients and their Caregivers ? American Cancer Society: Can assist with transportation, wigs, general needs, runs Look Good Feel Better.         3376056581 ? Cancer Care: Provides financial assistance, online support groups, medication/co-pay assistance.  1-800-813-HOPE 939-171-4847) ? [facility] Assists [place] cancer patients and their families through emotional , educational and financial support.  508 212 4991 ? [place] DSS Where to apply for food stamps, Medicaid and utility assistance. (939)662-3774 ? [employer]: Transportation to medical appointments. 904-001-1290 ? Social Security Administration: May apply for disability if have a Stage IV cancer. 423-579-0490 802-607-5091 ? [employer], Disability and Transit Services: Assists with nutrition, care and transit needs. [place] Support Programs: @10RELATIVEDAYS @ > Cancer Support Group  2nd Tuesday of the month 1pm-2pm, Journey Room  > Creative Journey  3rd Tuesday of the month 1130am-1pm, Journey Room  > Look Good Feel Better  1st Wednesday of the month 10am-12 noon, Journey Room (Call American Cancer Society to register [phone])   Influenza Virus Vaccine injection (Fluarix) What is this medicine? INFLUENZA VIRUS VACCINE (in floo EN zuh VAHY ruhs vak SEEN) helps to reduce the risk of getting influenza also known as the flu. This medicine may be used for other purposes; ask your health care provider or pharmacist if you have questions. What should I tell my health care provider before I take this medicine? They need to know if you have any of these conditions: -bleeding disorder like hemophilia -fever or infection -Guillain-Barre syndrome or other neurological problems -immune system problems -infection with the human immunodeficiency virus (HIV) or AIDS -low blood platelet counts -  multiple sclerosis -an unusual or allergic reaction to influenza virus vaccine, eggs, chicken proteins, latex, gentamicin, other medicines, foods, dyes or preservatives -pregnant or trying to get  pregnant -breast-feeding How should I use this medicine? This vaccine is for injection into a muscle. It is given by a health care professional. A copy of Vaccine Information Statements will be given before each vaccination. Read this sheet carefully each time. The sheet may change frequently. Talk to your pediatrician regarding the use of this medicine in children. Special care may be needed. Overdosage: If you think you have taken too much of this medicine contact a poison control center or emergency room at once. NOTE: This medicine is only for you. Do not share this medicine with others. What if I miss a dose? This does not apply. What may interact with this medicine? -chemotherapy or radiation therapy -medicines that lower your immune system like etanercept, anakinra, infliximab, and adalimumab -medicines that treat or prevent blood clots like warfarin -phenytoin -steroid medicines like prednisone or cortisone -theophylline -vaccines This list may not describe all possible interactions. Give your health care provider a list of all the medicines, herbs, non-prescription drugs, or dietary supplements you use. Also tell them if you smoke, drink alcohol, or use illegal drugs. Some items may interact with your medicine. What should I watch for while using this medicine? Report any side effects that do not go away within 3 days to your doctor or health care professional. Call your health care provider if any unusual symptoms occur within 6 weeks of receiving this vaccine. You may still catch the flu, but the illness is not usually as bad. You cannot get the flu from the vaccine. The vaccine will not protect against colds or other illnesses that may cause fever. The vaccine is needed every year. What side effects may I notice from receiving this medicine? Side effects that you should report to your doctor or health care professional as soon as possible: -allergic reactions like skin rash,  itching or hives, swelling of the face, lips, or tongue Side effects that usually do not require medical attention (report to your doctor or health care professional if they continue or are bothersome): -fever -headache -muscle aches and pains -pain, tenderness, redness, or swelling at site where injected -weak or tired This list may not describe all possible side effects. Call your doctor for medical advice about side effects. You may report side effects to FDA at 1-800-FDA-1088. Where should I keep my medicine? This vaccine is only given in a clinic, pharmacy, doctor's office, or other health care setting and will not be stored at home. NOTE: This sheet is a summary. It may not cover all possible information. If you have questions about this medicine, talk to your doctor, pharmacist, or health care provider.    2016, Elsevier/Gold Standard. (2007-12-17 09:30:40)

## 2016-04-04 ENCOUNTER — Telehealth (HOSPITAL_COMMUNITY): Payer: Self-pay

## 2016-04-04 NOTE — Telephone Encounter (Signed)
Called patient back answered his concerns.

## 2016-04-04 NOTE — Telephone Encounter (Signed)
-----   Message from Louis Meckel sent at 04/04/2016  9:57 AM EDT ----- Please call patient about prostate test,  He has questions about his hormone treatment.Please call

## 2016-04-16 ENCOUNTER — Encounter (HOSPITAL_COMMUNITY): Payer: PPO | Attending: Hematology & Oncology

## 2016-04-16 ENCOUNTER — Other Ambulatory Visit (HOSPITAL_COMMUNITY): Payer: Self-pay

## 2016-04-16 DIAGNOSIS — C61 Malignant neoplasm of prostate: Secondary | ICD-10-CM | POA: Diagnosis not present

## 2016-04-16 DIAGNOSIS — M8710 Osteonecrosis due to drugs, unspecified bone: Secondary | ICD-10-CM | POA: Diagnosis not present

## 2016-04-16 DIAGNOSIS — E876 Hypokalemia: Secondary | ICD-10-CM

## 2016-04-16 LAB — CBC WITH DIFFERENTIAL/PLATELET
Basophils Absolute: 0 10*3/uL (ref 0.0–0.1)
Basophils Relative: 1 %
Eosinophils Absolute: 0.4 10*3/uL (ref 0.0–0.7)
Eosinophils Relative: 5 %
HCT: 36.9 % — ABNORMAL LOW (ref 39.0–52.0)
Hemoglobin: 12 g/dL — ABNORMAL LOW (ref 13.0–17.0)
Lymphocytes Relative: 31 %
Lymphs Abs: 2.7 10*3/uL (ref 0.7–4.0)
MCH: 29.4 pg (ref 26.0–34.0)
MCHC: 32.5 g/dL (ref 30.0–36.0)
MCV: 90.4 fL (ref 78.0–100.0)
Monocytes Absolute: 0.7 10*3/uL (ref 0.1–1.0)
Monocytes Relative: 8 %
Neutro Abs: 4.9 10*3/uL (ref 1.7–7.7)
Neutrophils Relative %: 55 %
Platelets: 237 10*3/uL (ref 150–400)
RBC: 4.08 MIL/uL — ABNORMAL LOW (ref 4.22–5.81)
RDW: 13 % (ref 11.5–15.5)
WBC: 8.8 10*3/uL (ref 4.0–10.5)

## 2016-04-16 LAB — COMPREHENSIVE METABOLIC PANEL
ALT: 12 U/L — ABNORMAL LOW (ref 17–63)
AST: 20 U/L (ref 15–41)
Albumin: 4.3 g/dL (ref 3.5–5.0)
Alkaline Phosphatase: 63 U/L (ref 38–126)
Anion gap: 5 (ref 5–15)
BUN: 5 mg/dL — ABNORMAL LOW (ref 6–20)
CO2: 31 mmol/L (ref 22–32)
Calcium: 9 mg/dL (ref 8.9–10.3)
Chloride: 104 mmol/L (ref 101–111)
Creatinine, Ser: 1.04 mg/dL (ref 0.61–1.24)
GFR calc Af Amer: >60 mL/min (ref >60)
GFR calc non Af Amer: >60 mL/min (ref >60)
Glucose, Bld: 115 mg/dL — ABNORMAL HIGH (ref 65–99)
Potassium: 5.3 mmol/L — ABNORMAL HIGH (ref 3.5–5.1)
Sodium: 140 mmol/L (ref 135–145)
Total Bilirubin: 0.5 mg/dL (ref 0.3–1.2)
Total Protein: 6.8 g/dL (ref 6.5–8.1)

## 2016-04-16 LAB — PSA: PSA: <0.01 ng/mL (ref 0.00–4.00)

## 2016-04-16 MED ORDER — POTASSIUM CHLORIDE CRYS ER 20 MEQ PO TBCR: 20.0000 meq | EXTENDED_RELEASE_TABLET | Freq: Two times a day (BID) | ORAL | 2 refills | Status: DC

## 2016-04-16 NOTE — Telephone Encounter (Signed)
Received refill request from patients pharmacy for potassium. Chart checked and refilled. 

## 2016-04-17 ENCOUNTER — Other Ambulatory Visit (HOSPITAL_COMMUNITY): Payer: Self-pay | Admitting: Oncology

## 2016-04-17 ENCOUNTER — Ambulatory Visit: Payer: PPO | Admitting: Family Medicine

## 2016-04-17 DIAGNOSIS — M8710 Osteonecrosis due to drugs, unspecified bone: Secondary | ICD-10-CM

## 2016-04-17 DIAGNOSIS — C61 Malignant neoplasm of prostate: Secondary | ICD-10-CM

## 2016-04-17 MED ORDER — OXYCODONE HCL 10 MG PO TABS: 10.0000 mg | ORAL_TABLET | ORAL | 0 refills | Status: DC | PRN

## 2016-04-17 MED ORDER — OXYCODONE HCL ER 60 MG PO T12A: 60.0000 mg | EXTENDED_RELEASE_TABLET | Freq: Three times a day (TID) | ORAL | 0 refills | Status: DC

## 2016-04-18 ENCOUNTER — Encounter: Payer: Self-pay | Admitting: Family Medicine

## 2016-04-18 ENCOUNTER — Ambulatory Visit (INDEPENDENT_AMBULATORY_CARE_PROVIDER_SITE_OTHER): Payer: PPO | Admitting: Family Medicine

## 2016-04-18 VITALS — BP 120/76 | Ht 69.0 in | Wt 165.5 lb

## 2016-04-18 DIAGNOSIS — K219 Gastro-esophageal reflux disease without esophagitis: Secondary | ICD-10-CM

## 2016-04-18 DIAGNOSIS — R739 Hyperglycemia, unspecified: Secondary | ICD-10-CM

## 2016-04-18 DIAGNOSIS — E782 Mixed hyperlipidemia: Secondary | ICD-10-CM | POA: Diagnosis not present

## 2016-04-18 DIAGNOSIS — M792 Neuralgia and neuritis, unspecified: Secondary | ICD-10-CM

## 2016-04-18 NOTE — Progress Notes (Signed)
   Subjective:    Patient ID: [name] [name], male    DOB: [birth year], 60 y.o.   MRN: [id number]  Hyperlipidemia  This is a chronic problem. The current episode started more than 1 year ago. There are no known factors aggravating his hyperlipidemia. Current antihyperlipidemic treatment includes statins. The current treatment provides moderate improvement of lipids. There are no compliance problems.     Patient notes ongoing challenges with reflux. Compliant with medication. Has helped a lot. No longer experiencing dysphagia. Still due to see GI specialist soon.  Ongoing challenges with psoriasis. Compliant with the dovenox no obvious side effects  Continues to see oncologist for follow-up with history of prostate cancer. Also on chronic pain medication delivered via them next   Patient has no concerns at this time.    Had flu shot  Exercising more and more feeling bretter with it  Review of Systems No headache, no major weight loss or weight gain, no chest pain no back pain abdominal pain no change in bowel habits complete ROS otherwise negative     Objective:   Physical Exam  Alert vitals stable, NAD. Blood pressure good on repeat. HEENT normal. Lungs clear. Heart regular rate and rhythm. Skin multiple patches of psoriasis evident overall decent control      Assessment & Plan:  Impression 1 hyperlipidemia and discuss status uncertain will check blood work #2 chronic reflux, dysphasia still waiting discussed with gastroenterologist. #3 psoriasis fair control encourage maintain same meds plan diet exercise discussed. Already has had flu shot. Up-to-date another immunization medications refilled further recommendations based on blood work. Of note sugar is been elevated recently also and we will evaluate status

## 2016-04-20 DIAGNOSIS — E782 Mixed hyperlipidemia: Secondary | ICD-10-CM | POA: Diagnosis not present

## 2016-04-20 DIAGNOSIS — R739 Hyperglycemia, unspecified: Secondary | ICD-10-CM | POA: Diagnosis not present

## 2016-04-21 LAB — LIPID PANEL
Chol/HDL Ratio: 3.3 ratio units (ref 0.0–5.0)
Cholesterol, Total: 150 mg/dL (ref 100–199)
HDL: 45 mg/dL (ref >39)
LDL Calculated: 84 mg/dL (ref 0–99)
Triglycerides: 104 mg/dL (ref 0–149)
VLDL Cholesterol Cal: 21 mg/dL (ref 5–40)

## 2016-04-21 LAB — HEMOGLOBIN A1C
Est. average glucose Bld gHb Est-mCnc: 108 mg/dL
Hgb A1c MFr Bld: 5.4 % (ref 4.8–5.6)

## 2016-04-22 ENCOUNTER — Encounter: Payer: Self-pay | Admitting: Family Medicine

## 2016-04-25 ENCOUNTER — Encounter: Payer: Self-pay | Admitting: Gastroenterology

## 2016-04-25 ENCOUNTER — Ambulatory Visit (INDEPENDENT_AMBULATORY_CARE_PROVIDER_SITE_OTHER): Payer: PPO | Admitting: Gastroenterology

## 2016-04-25 VITALS — BP 94/68 | HR 60 | Ht 67.32 in | Wt 164.5 lb

## 2016-04-25 DIAGNOSIS — R1310 Dysphagia, unspecified: Secondary | ICD-10-CM | POA: Diagnosis not present

## 2016-04-25 NOTE — Patient Instructions (Addendum)
If you are age 62 or older, your body mass index should be between 23-30. Your Body mass index is 25.52 kg/m. If this is out of the aforementioned range listed, please consider follow up with your Primary Care Provider.  If you are age 21 or younger, your body mass index should be between 19-25. Your Body mass index is 25.52 kg/m. If this is out of the aformentioned range listed, please consider follow up with your Primary Care Provider.   You have been scheduled for an endoscopy. Please follow written instructions given to you at your visit today. If you use inhalers (even only as needed), please bring them with you on the day of your procedure. Your physician has requested that you go to www.startemmi.com and enter the access code given to you at your visit today. This web site gives a general overview about your procedure. However, you should still follow specific instructions given to you by our office regarding your preparation for the procedure.  Dentures will probably help with your chewing so that you can swallow easier.  Please consider getting dentures.

## 2016-04-25 NOTE — Progress Notes (Signed)
Review of pertinent gastrointestinal problems: 1. EGD Ardis Hughs 06/2010 for rectal bleeding, dysphagia: 1) Mild gastritis; biopsied to check for H. pylori2) GERD related edema at [facility]) Otherwise normal examination; path showed no h. pylori 2. Colonoscopy Dr. Ardis Hughs 06/2010 done for minor rectal bleeding. : small polyp (was TA on path), internal hemorrhoids.  Colonoscopy Dr. Ardis Hughs 11/2014 (for minor rectal bleeding): 2 subCM polyps (one was TA), diverticulosis throughout, + internal hemorrhoids).  Was recommended to have repeat colonoscopy in 5 years.   HPI: This is a   very pleasant 60 year old man whom I last saw about 6 months ago   Chief complaint is dysphagia  Weight is 1 pound lower than 5 months ago (here in GI office, same scale).   Has sensation of dysphagia, lower esophagus.   This happens nearly daily. He takes proton pump inhibitor once daily as well. He is not seeming to be bothered by liquids.   He has Only 4 teeth in the front, lower. These are all in pretty poor condition  He takes protonix 40mg  one pill once daily.   ROS: complete GI ROS as described in HPI.  Constitutional:  No unintentional weight loss   Past Medical History:  Diagnosis Date  . Anxiety   . Asthma   . COPD (chronic obstructive pulmonary disease) ([place])   . Coronary artery disease    DES to LAD 1/12, has 2 stents  . DDD (degenerative disc disease)   . Depression   . Drug reaction    Pamidronate - induced skin toxicity, Zometa - induced toxicity  . Essential hypertension   . Gastritis   . GERD (gastroesophageal reflux disease)   . Horseshoe kidney    Single kidney  . Internal hemorrhoids    Colonoscopy 1/12  . Myocardial infarction    2012  . Osteoarthritis   . 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 Prostate cancer ([place])    Metastatic, stage IV  - [name]  . Psoriasis   . Sigmoid diverticulosis   . Tubular adenoma    Colonoscopy 1/12    Past Surgical History:   Procedure Laterality Date  . ANTERIOR FUSION CERVICAL SPINE    . APPENDECTOMY    . ETHMOIDECTOMY Left 09/28/2013   Procedure: LEFT ENDOSCOPIC ANTERIOR ETHMOIDECTOMY;  Surgeon: [name], MD;  Location: [facility];  Service: ENT;  Laterality: Left;  . HEART STENTS  06/2010   2  . LEFT FOOT SURGERY    . MAXILLARY ANTROSTOMY Left 09/28/2013   Procedure: LEFT ENDOSCOPIC MAXILLARY ANTROSTOMY WITH REMOVAL OF TISSUE;  Surgeon: [name], MD;  Location: [facility];  Service: ENT;  Laterality: Left;  . POSTERIOR FUSION CERVICAL SPINE    . PROSTATECTOMY  02/28/2006   [name] in [place], [place].    Current Outpatient Prescriptions  Medication Sig Dispense Refill  . ALPRAZolam (XANAX) 0.5 MG tablet Take 1 tablet (0.5 mg total) by mouth 4 (four) times daily as needed for anxiety. 120 tablet 5  . aspirin EC 81 MG EC tablet Take 1 tablet (81 mg total) by mouth daily.    . calcipotriene (DOVONOX) 0.005 % cream Apply topically 2 (two) times daily.     . calcipotriene-betamethasone (TACLONEX) ointment Apply 1 application topically daily. 60 g 3  . CAPEX 0.01 % SHAM Apply 1 application topically as needed (psoriasis).     . chlorhexidine (PERIDEX) 0.12 % solution USE 15 ML BY MOUTH TO SWISH  AND SPIT TWICE DAILY 473 mL 0  . Clobetasol Propionate (TEMOVATE) 0.05 % external spray APPLY EXTERNALLY TO THE AFFECTED AREA UP TO TWICE DAILY AS NEEDED 59 mL 0  . Clobetasol Propionate 0.05 % shampoo USE SHAMPOO AS NEEDED 118 mL 11  . Elastic Bandages & Supports (LUMBAR BACK BRACE/SUPPORT PAD) MISC 1 each by Does not apply route as directed. 1 each 1  . fluticasone (FLONASE) 50 MCG/ACT nasal spray Place 1 spray into both nostrils daily.  0  . gabapentin (NEURONTIN) 300 MG capsule TAKE 3 CAPSULES AT BEDTIME 90 capsule 3  . leuprolide, 6 Month, (ELIGARD) 45 MG injection Inject 45 mg into the skin every 6 (six) months.     . meclizine (ANTIVERT) 25 MG tablet Take 25 mg by mouth 3 (three)  times daily as needed for dizziness. Reported on 11/22/2015    . metoprolol succinate (TOPROL-XL) 25 MG 24 hr tablet TAKE 1 TABLET BY MOUTH EVERY DAY 30 tablet 5  . mupirocin ointment (BACTROBAN) 2 % Apply 1 application topically 2 (two) times daily. To rash 30 g 0  . nitroGLYCERIN (NITROSTAT) 0.4 MG SL tablet Place 1 tablet (0.4 mg total) under the tongue every 5 (five) minutes as needed for chest pain. 25 tablet 6  . oxyCODONE (OXYCONTIN) 60 MG 12 hr tablet Take 60 mg by mouth every 8 (eight) hours. 90 each 0  . Oxycodone HCl 10 MG TABS Take 1-2 tablets (10-20 mg total) by mouth every 4 (four) hours as needed. 180 tablet 0  . pantoprazole (PROTONIX) 40 MG tablet TAKE 1 TABLET BY MOUTH DAILY 30 tablet 0  . pravastatin (PRAVACHOL) 80 MG tablet TAKE 1 TABLET(80 MG) BY MOUTH DAILY 30 tablet 5  . PROAIR HFA 108 (90 BASE) MCG/ACT inhaler inhale 1 puff every 4 hours WITH AEROCHAMBER 8.5 g 1  . sucralfate (CARAFATE) 1 g tablet TAKE 1 TABLET BY MOUTH FOUR TIMES DAILY BEAFORE MEALS AND AT BEDTIME 60 tablet 5   No current facility-administered medications for this visit.     Allergies as of 04/25/2016 - Review Complete 04/25/2016  Allergen Reaction Noted  . Other Other (See Comments) 09/28/2013  . Zoledronic acid    . Pamidronate Other (See Comments) and Rash 12/24/2012    Family History  Problem Relation Age of Onset  . Lung cancer Father   . Heart attack Brother   . Alzheimer's disease Mother     Social History   Social History  . Marital status: Married    Spouse name: N/A  . Number of children: 3  . Years of education: N/A   Occupational History  . 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  Unemployed   Social History Main Topics  . Smoking status: Former Smoker    Packs/day: 1.00    Years: 15.00    Types: Cigarettes    Quit date: 02/20/2006  . Smokeless tobacco: Never Used  . Alcohol use No     Comment: Quit in 2007. Previous 12 pack of beer per week.  . Drug use:  No  . Sexual activity: Not on file   Other Topics Concern  . Not on file   Social History Narrative  . No narrative on file     Physical Exam: BP 94/68 (BP Location: Left Arm, Patient Position: Sitting, Cuff Size: Normal)   Pulse 60   Ht 5' 7.32" (1.71 m)   Wt 164 lb 8 oz (74.6 kg)   BMI 25.52  kg/m  Constitutional: generally well-appearing Nearly edentulous with for teeth on the lower jaw only, these are in poor condition Psychiatric: alert and oriented x3 Abdomen: soft, nontender, nondistended, no obvious ascites, no peritoneal signs, normal bowel sounds No peripheral edema noted in lower extremities  Assessment and plan: 60 y.o. male with chronic intermittent dysphasia  I suspect his near edentulous status plays a role in his dysphagia. ETT has are in very poor condition as well. He says he chews his food with his gums as best he can pay cuts his food up is a small possible. I recommended proceed with EGD at his soonest convenience check for underlying stricture if none are found that I think his best solution will be getting some good fitting dentures. He'll continue on proton pump inhibitor once daily.   [name] Loffler, MD [facility] 04/25/2016, 11:20 AM

## 2016-04-30 ENCOUNTER — Ambulatory Visit (AMBULATORY_SURGERY_CENTER): Payer: PPO | Admitting: Gastroenterology

## 2016-04-30 ENCOUNTER — Encounter: Payer: Self-pay | Admitting: Gastroenterology

## 2016-04-30 VITALS — BP 110/78 | HR 59 | Temp 97.5°F | Resp 15 | Ht 67.0 in | Wt 164.0 lb

## 2016-04-30 DIAGNOSIS — R1310 Dysphagia, unspecified: Secondary | ICD-10-CM | POA: Diagnosis not present

## 2016-04-30 DIAGNOSIS — R1319 Other dysphagia: Secondary | ICD-10-CM

## 2016-04-30 DIAGNOSIS — K222 Esophageal obstruction: Secondary | ICD-10-CM

## 2016-04-30 DIAGNOSIS — I2510 Atherosclerotic heart disease of native coronary artery without angina pectoris: Secondary | ICD-10-CM | POA: Diagnosis not present

## 2016-04-30 DIAGNOSIS — K219 Gastro-esophageal reflux disease without esophagitis: Secondary | ICD-10-CM | POA: Diagnosis not present

## 2016-04-30 MED ORDER — SODIUM CHLORIDE 0.9 % IV SOLN: 500.0000 mL | INTRAVENOUS | Status: DC

## 2016-04-30 NOTE — Patient Instructions (Signed)
YOU HAD AN ENDOSCOPIC PROCEDURE TODAY AT [facility]:   Refer to the procedure report that was given to you for any specific questions about what was found during the examination.  If the procedure report does not answer your questions, please call your gastroenterologist to clarify.  If you requested that your care partner not be given the details of your procedure findings, then the procedure report has been included in a sealed envelope for you to review at your convenience later.  YOU SHOULD EXPECT: Some feelings of bloating in the abdomen. Passage of more gas than usual.  Walking can help get rid of the air that was put into your GI tract during the procedure and reduce the bloating. If you had a lower endoscopy (such as a colonoscopy or flexible sigmoidoscopy) you may notice spotting of blood in your stool or on the toilet paper. If you underwent a bowel prep for your procedure, you may not have a normal bowel movement for a few days.  Please Note:  You might notice some irritation and congestion in your nose or some drainage.  This is from the oxygen used during your procedure.  There is no need for concern and it should clear up in a day or so.  SYMPTOMS TO REPORT IMMEDIATELY:    Following upper endoscopy (EGD)  Vomiting of blood or coffee ground material  New chest pain or pain under the shoulder blades  Painful or persistently difficult swallowing  New shortness of breath  Fever of 100F or higher  Black, tarry-looking stools  For urgent or emergent issues, a gastroenterologist can be reached at any hour by calling [phone].   DIET:  Follow dilation diet today- see handout  Drink plenty of fluids but you should avoid alcoholic beverages for 24 hours.  ACTIVITY:  You should plan to take it easy for the rest of today and you should NOT DRIVE or use heavy machinery until tomorrow (because of the sedation medicines used during the test).    FOLLOW UP: Our staff  will call the number listed on your records the next business day following your procedure to check on you and address any questions or concerns that you may have regarding the information given to you following your procedure. If we do not reach you, we will leave a message.  However, if you are feeling well and you are not experiencing any problems, there is no need to return our call.  We will assume that you have returned to your regular daily activities without incident.  SIGNATURES/CONFIDENTIALITY: You and/or your care partner have signed paperwork which will be entered into your electronic medical record.  These signatures attest to the fact that that the information above on your After Visit Summary has been reviewed and is understood.  Full responsibility of the confidentiality of this discharge information lies with you and/or your care-partner.  Please follow dilation diet today- see handout

## 2016-04-30 NOTE — Progress Notes (Signed)
Report given to PACU RN, vss 

## 2016-04-30 NOTE — Op Note (Signed)
[facility] Patient Name: [name] [name] Procedure Date: 04/30/2016 11:12 AM MRN: [id number] Endoscopist: Milus Banister , MD Age: 60 Referring MD:  Date of Birth: [birth year] Gender: Male Account #: [id number] Procedure:                Upper GI endoscopy Indications:              Dysphagia, Heartburn Medicines:                Monitored Anesthesia Care Procedure:                Pre-Anesthesia Assessment:                           - Prior to the procedure, a History and Physical                            was performed, and patient medications and                            allergies were reviewed. The patient's tolerance of                            previous anesthesia was also reviewed. The risks                            and benefits of the procedure and the sedation                            options and risks were discussed with the patient.                            All questions were answered, and informed consent                            was obtained. Prior Anticoagulants: The patient has                            taken no previous anticoagulant or antiplatelet                            agents. ASA Grade Assessment: III - A patient with                            severe systemic disease. After reviewing the risks                            and benefits, the patient was deemed in                            satisfactory condition to undergo the procedure.                           After obtaining informed consent, the endoscope was  passed under direct vision. Throughout the                            procedure, the patient's blood pressure, pulse, and                            oxygen saturations were monitored continuously. The                            Model GIF-HQ190 [id number]) scope was introduced                            through the mouth, and advanced to the second part                            of duodenum. The upper GI  endoscopy was                            accomplished without difficulty. The patient                            tolerated the procedure well. Scope In: Scope Out: Findings:                 One mild benign-appearing, intrinsic stenosis (thin                            Schatzki's ring) was found at the gastroesophageal                            junction. A TTS dilator was passed through the                            scope. Dilation with an 18-19-20 mm balloon dilator                            was performed to 20 mm.                           The exam was otherwise without abnormality. Complications:            No immediate complications. Estimated blood loss:                            None. Estimated Blood Loss:     Estimated blood loss: none. Impression:               - Thin Shatzki's ring; dilated with balloon to                            22mm. It is not clear if this ring causes your                            swallowing issues.                           -  The examination was otherwise normal.                           - No specimens collected. Recommendation:           - Patient has a contact number available for                            emergencies. The signs and symptoms of potential                            delayed complications were discussed with the                            patient. Return to normal activities tomorrow.                            Written discharge instructions were provided to the                            patient.                           - Resume previous diet.                           - Continue present medications.                           - You should consider having the rest of your teeth                            pulled and getting good fitting dentures. Milus Banister, MD 04/30/2016 11:25:49 AM This report has been signed electronically.

## 2016-05-01 ENCOUNTER — Telehealth: Payer: Self-pay

## 2016-05-01 NOTE — Telephone Encounter (Signed)
  Follow up Call-  Call back number 04/30/2016 11/08/2014  Post procedure Call Back phone  # [phone]  Permission to leave phone message Yes Yes  Some recent data might be hidden    Patient expressed how happy he was with the care that he received while in the [facility].   Patient questions:  Do you have a fever, pain , or abdominal swelling? No. Pain Score  0 *  Have you tolerated food without any problems? Yes.    Have you been able to return to your normal activities? Yes.    Do you have any questions about your discharge instructions: Diet   No. Medications  No. Follow up visit  No.  Do you have questions or concerns about your Care? No.  Actions: * If pain score is 4 or above: No action needed, pain <4.

## 2016-05-14 ENCOUNTER — Other Ambulatory Visit (HOSPITAL_COMMUNITY): Payer: Self-pay | Admitting: Oncology

## 2016-05-14 DIAGNOSIS — M8710 Osteonecrosis due to drugs, unspecified bone: Secondary | ICD-10-CM

## 2016-05-14 DIAGNOSIS — C61 Malignant neoplasm of prostate: Secondary | ICD-10-CM

## 2016-05-14 MED ORDER — OXYCODONE HCL 10 MG PO TABS: 10.0000 mg | ORAL_TABLET | ORAL | 0 refills | Status: DC | PRN

## 2016-05-14 MED ORDER — OXYCODONE HCL ER 60 MG PO T12A: 60.0000 mg | EXTENDED_RELEASE_TABLET | Freq: Three times a day (TID) | ORAL | 0 refills | Status: DC

## 2016-05-16 ENCOUNTER — Encounter (HOSPITAL_COMMUNITY): Payer: PPO | Attending: Hematology & Oncology

## 2016-05-16 DIAGNOSIS — C61 Malignant neoplasm of prostate: Secondary | ICD-10-CM | POA: Insufficient documentation

## 2016-05-16 LAB — CBC WITH DIFFERENTIAL/PLATELET
Basophils Absolute: 0.1 10*3/uL (ref 0.0–0.1)
Basophils Relative: 1 %
Eosinophils Absolute: 0.3 10*3/uL (ref 0.0–0.7)
Eosinophils Relative: 5 %
HCT: 39.2 % (ref 39.0–52.0)
Hemoglobin: 13 g/dL (ref 13.0–17.0)
Lymphocytes Relative: 37 %
Lymphs Abs: 2.6 10*3/uL (ref 0.7–4.0)
MCH: 29.8 pg (ref 26.0–34.0)
MCHC: 33.2 g/dL (ref 30.0–36.0)
MCV: 89.9 fL (ref 78.0–100.0)
Monocytes Absolute: 0.6 10*3/uL (ref 0.1–1.0)
Monocytes Relative: 9 %
Neutro Abs: 3.5 10*3/uL (ref 1.7–7.7)
Neutrophils Relative %: 48 %
Platelets: 292 10*3/uL (ref 150–400)
RBC: 4.36 MIL/uL (ref 4.22–5.81)
RDW: 13.2 % (ref 11.5–15.5)
WBC: 7.1 10*3/uL (ref 4.0–10.5)

## 2016-05-16 LAB — COMPREHENSIVE METABOLIC PANEL
ALT: 12 U/L — ABNORMAL LOW (ref 17–63)
AST: 19 U/L (ref 15–41)
Albumin: 4.4 g/dL (ref 3.5–5.0)
Alkaline Phosphatase: 56 U/L (ref 38–126)
Anion gap: 6 (ref 5–15)
BUN: 9 mg/dL (ref 6–20)
CO2: 28 mmol/L (ref 22–32)
Calcium: 9.1 mg/dL (ref 8.9–10.3)
Chloride: 104 mmol/L (ref 101–111)
Creatinine, Ser: 0.96 mg/dL (ref 0.61–1.24)
GFR calc Af Amer: >60 mL/min (ref >60)
GFR calc non Af Amer: >60 mL/min (ref >60)
Glucose, Bld: 107 mg/dL — ABNORMAL HIGH (ref 65–99)
Potassium: 4.2 mmol/L (ref 3.5–5.1)
Sodium: 138 mmol/L (ref 135–145)
Total Bilirubin: 0.5 mg/dL (ref 0.3–1.2)
Total Protein: 7 g/dL (ref 6.5–8.1)

## 2016-05-16 LAB — PSA: PSA: <0.01 ng/mL (ref 0.00–4.00)

## 2016-05-18 ENCOUNTER — Other Ambulatory Visit (HOSPITAL_COMMUNITY): Payer: Self-pay | Admitting: Oncology

## 2016-05-18 MED ORDER — GABAPENTIN 300 MG PO CAPS: ORAL_CAPSULE | ORAL | 3 refills | Status: DC

## 2016-05-30 ENCOUNTER — Other Ambulatory Visit: Payer: Self-pay | Admitting: Family Medicine

## 2016-05-30 NOTE — Telephone Encounter (Signed)
Patient has seen gastro since your last OV. Would you like to con't prescribing PPI or would you rather Dr. Ardis Hughs (gastro) rx?  He did recommend patient con't PPI at last OV. Please advise.

## 2016-06-06 NOTE — Telephone Encounter (Signed)
error 

## 2016-06-07 ENCOUNTER — Other Ambulatory Visit: Payer: Self-pay | Admitting: Family Medicine

## 2016-06-17 NOTE — Assessment & Plan Note (Addendum)
Adenocarcinoma of prostate, on Eligard every 6 months x many years with initial surgery performed by [name] in [facility], [place] showing 2/22 positive lymph nodes.  He was on bone protecting medication, but developed ONJ and therefore these medications were discontinued.  Oncology history is up-to-date.  Labs today: CBC diff, CMET, PSA.  I personally reviewed and went over laboratory results with the patient.  The results are noted within this dictation.  Eligard injection today and then in July 2018.  Eligard is not authorized and therefore will be rescheduled for later this week when approved.  Labs in 3 months: CBC diff, CMET, PSA.  I have refilled his pain medications in the typical fashion.  [place] Controlled Substance Reporting System is reviewed in detail.  His Oxycontin and Oxycodone were both filled last on 05/20/2016.  Xanax was last filled on 05/20/2016 as well.  He is on more short-acting pain medication than what is reasonable and we would prefer him to increase his long-acting pain medication.  However, he has been on this current regimen for years by his previous oncologist and therefore, we will not change at this time.  I will refer the patient to [name] (Derm) for skin lesions that will not resolve.  He is provided information about further tooth extraction.  I have printed him a copy of the telephone encounter I had with [name] in July 2017.  Return in 3 months for follow-up.

## 2016-06-17 NOTE — Progress Notes (Signed)
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 male returns for followup of adenocarcinoma of prostate, on Eligard every 6 months x many years with initial surgery performed by [name] in [facility], [place] showing 2/22 positive lymph nodes.  He was on bone protecting medication, but developed ONJ and therefore these medications were discontinued.    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, LN involvement. Final staging stage IV , T1c, N1      02/28/2006 Pathology Results    Poorly Differentiated Adenocarcinoma, GLEASON' S SCORE 4+4=8      06/06/2009 - 12/16/2012 Chemotherapy    XGEVA,  according to records was used Q28 days for "bone protection" but no documented bone metastases (Prior therapy with pamidronate and zometa)      09/16/2013 Imaging    MRI soft tissue mass filling the left maxillary sinuses and L anterior ethmoid air cells, lesion 4.5 x 4 x 6.2 cm. Evidence of involvement of anterior wall of L maxillary sinus      09/28/2013 Surgery    Left endoscopic maxillary antrostomy with tissue removal. Left endoscopic anterior ethmoidectomy      09/28/2013 Pathology Results    No evidence of malignancy      12/07/2013 Imaging    DEXA with osteopenia       He reports issues with his appetite.  "Can I have something for my appetite?"  Weight loss is noted.  I will consult RD first.  04/30/2016 he underwent an upper GI by Dr. Ardis Hughs.  Results demonstrate: - Thin Shatzki's ring; dilated with balloon to 73mm. It is not clear if this ring causes your swallowing issues. -  The examination was otherwise normal. - No specimens collected.  He notes skin lesions that come and go.  He is removing them manually and it is leaving behind open craters that heal, but the lesion recurs.  He notes that he removes "polyps" that are deep.  "Sometimes they have long hairs in them."  He reports that this has occurred in the past and resolved, but now he has a recurrent area that will not heal.  He has seen Derm in the past.  He asks about tooth extraction.  He will be provided the information regarding telephone encounter [name] and I had.  Review of Systems  Constitutional: Positive for weight loss. Negative for chills and fever.  HENT: Negative.   Eyes: Negative.   Respiratory: Negative.  Negative for cough.   Cardiovascular: Negative for chest pain.  Gastrointestinal: Negative.  Negative for nausea and vomiting.  Genitourinary: Negative.   Musculoskeletal: Negative.   Skin: Negative.        Skin lesions, recurrent  Neurological: Negative.  Negative for weakness.  Endo/Heme/Allergies: Negative.   Psychiatric/Behavioral: Negative.     Past Medical History:  Diagnosis Date  . Anxiety   . Asthma   . COPD (chronic obstructive pulmonary disease) ([place])   . Coronary artery disease    DES to LAD 1/12, has 2 stents  . DDD (degenerative disc disease)   . Depression   .  Drug reaction    Pamidronate - induced skin toxicity, Zometa - induced toxicity  . Essential hypertension   . Gastritis   . GERD (gastroesophageal reflux disease)   . Horseshoe kidney    Single kidney  . Internal hemorrhoids    Colonoscopy 1/12  . Myocardial infarction    2012  . Osteoarthritis   . 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 Prostate cancer ([place])    Metastatic, stage IV  - [name]  . Psoriasis   . Sigmoid diverticulosis   . Tubular adenoma    Colonoscopy 1/12    Past Surgical History:  Procedure Laterality Date  . ANTERIOR FUSION CERVICAL SPINE    .  APPENDECTOMY    . ETHMOIDECTOMY Left 09/28/2013   Procedure: LEFT ENDOSCOPIC ANTERIOR ETHMOIDECTOMY;  Surgeon: [name], MD;  Location: [facility];  Service: ENT;  Laterality: Left;  . HEART STENTS  06/2010   2  . LEFT FOOT SURGERY    . MAXILLARY ANTROSTOMY Left 09/28/2013   Procedure: LEFT ENDOSCOPIC MAXILLARY ANTROSTOMY WITH REMOVAL OF TISSUE;  Surgeon: [name], MD;  Location: [facility];  Service: ENT;  Laterality: Left;  . POSTERIOR FUSION CERVICAL SPINE    . PROSTATECTOMY  02/28/2006   [name] in [place], [place].    Family History  Problem Relation Age of Onset  . Lung cancer Father   . Heart attack Brother   . Alzheimer's disease Mother   . Colon cancer Maternal Uncle     Social History   Social History  . Marital status: Married    Spouse name: N/A  . Number of children: 3  . Years of education: N/A   Occupational History  . 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  Unemployed   Social History Main Topics  . Smoking status: Former Smoker    Packs/day: 1.00    Years: 15.00    Types: Cigarettes    Quit date: 02/20/2006  . Smokeless tobacco: Never Used  . Alcohol use No     Comment: Quit in 2007. Previous 12 pack of beer per week.  . Drug use: No  . Sexual activity: No   Other Topics Concern  . None   Social History Narrative  . None     PHYSICAL EXAMINATION  ECOG PERFORMANCE STATUS: 1 - Symptomatic but completely ambulatory  Vitals:   06/18/16 1405  BP: 121/81  Pulse: (!) 51  Resp: 18  Temp: 98 F (36.7 C)    GENERAL:alert, no distress, comfortable, cooperative, smiling and chronically ill appearing, unaccompanied. SKIN: skin color, texture, turgor are normal.  Right anterolateral middle arm (proximal to olecranon fold) crater that is healed without any discharge, measuring 1 cm in size. HEAD: Normocephalic, No masses, lesions, tenderness or abnormalities EYES: normal, EOMI, Conjunctiva are pink and  non-injected EARS: External ears normal OROPHARYNX:lips, buccal mucosa, and tongue normal and mucous membranes are moist  NECK: supple, trachea midline LYMPH:  no palpable lymphadenopathy BREAST:not examined LUNGS: clear to auscultation  HEART: regular rate & rhythm, no murmurs, no gallops, S1 normal and S2 normal ABDOMEN:abdomen soft and normal bowel sounds BACK: Back symmetric, no curvature. EXTREMITIES:less then 2 second capillary refill, no joint deformities, effusion, or inflammation, no skin discoloration, no cyanosis.  NEURO: alert & oriented x 3 with fluent speech, no focal motor/sensory deficits, gait normal   LABORATORY DATA: CBC    Component Value Date/Time  WBC 6.1 06/18/2016 1338   RBC 3.89 (L) 06/18/2016 1338   HGB 12.0 (L) 06/18/2016 1338   HCT 34.7 (L) 06/18/2016 1338   PLT 263 06/18/2016 1338   MCV 89.2 06/18/2016 1338   MCH 30.8 06/18/2016 1338   MCHC 34.6 06/18/2016 1338   RDW 13.2 06/18/2016 1338   LYMPHSABS 2.4 06/18/2016 1338   MONOABS 0.5 06/18/2016 1338   EOSABS 0.3 06/18/2016 1338   BASOSABS 0.1 06/18/2016 1338      Chemistry      Component Value Date/Time   NA 138 06/18/2016 1338   NA 142 10/17/2015 1450   K 3.5 06/18/2016 1338   CL 104 06/18/2016 1338   CO2 27 06/18/2016 1338   BUN 7 06/18/2016 1338   BUN 7 (L) 10/17/2015 1450   CREATININE 0.94 06/18/2016 1338      Component Value Date/Time   CALCIUM 8.6 (L) 06/18/2016 1338   ALKPHOS 58 06/18/2016 1338   AST 19 06/18/2016 1338   ALT 11 (L) 06/18/2016 1338   BILITOT 0.7 06/18/2016 1338   BILITOT <0.2 10/17/2015 1450     Lab Results  Component Value Date   PSA <0.01 05/16/2016   PSA <0.01 04/16/2016   PSA <0.01 03/16/2016     PENDING LABS:   RADIOGRAPHIC STUDIES:  No results found.   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 in [facility], [place] showing 2/22 positive lymph nodes.  He was on bone protecting medication, but developed ONJ and therefore these medications were discontinued.  Oncology history is up-to-date.  Labs today: CBC diff, CMET, PSA.  I personally reviewed and went over laboratory results with the patient.  The results are noted within this dictation.  Eligard injection today and then in July 2018.  Eligard is not authorized and therefore will be rescheduled for later this week when approved.  Labs in 3 months: CBC diff, CMET, PSA.  I have refilled his pain medications in the typical fashion.  [place] Controlled Substance Reporting System is reviewed in detail.  His Oxycontin and Oxycodone were both filled last on 05/20/2016.  Xanax was last filled on 05/20/2016 as well.  He is on more short-acting pain medication than what is reasonable and we would prefer him to increase his long-acting pain medication.  However, he has been on this current regimen for years by his previous oncologist and therefore, we will not change at this time.  I will refer the patient to [name] (Derm) for skin lesions that will not resolve.  He is provided information about further tooth extraction.  I have printed him a copy of the telephone encounter I had with [name] in July 2017.  Return in 3 months for follow-up.    ORDERS PLACED FOR THIS ENCOUNTER: Orders Placed This Encounter  Procedures  . CBC with Differential  . Comprehensive metabolic panel  . PSA    MEDICATIONS PRESCRIBED THIS ENCOUNTER: Meds ordered this encounter  Medications  . Oxycodone HCl 10 MG TABS    Sig: Take 1-2 tablets (10-20 mg total) by mouth every 4 (four) hours as needed.    Dispense:  180 tablet    Refill:  0    Order Specific Question:   Supervising Provider    Answer:   Patrici Ranks [id number]  . oxyCODONE (OXYCONTIN) 60 MG  12 hr tablet    Sig: Take 60 mg by mouth every 8 (eight) hours.     Dispense:  90 each    Refill:  0    Order Specific Question:   Supervising Provider    Answer:   Patrici Ranks [id number]    THERAPY PLAN:  Continue Eligard every 6 months.  All questions were answered. The patient knows to call the clinic with any problems, questions or concerns. We can certainly see the patient much sooner if necessary.  Patient and plan discussed with Dr. Ancil Linsey and she is in agreement with the aforementioned.   This note is electronically signed by: Doy Mince 06/18/2016 2:51 PM

## 2016-06-18 ENCOUNTER — Encounter (HOSPITAL_COMMUNITY): Payer: Self-pay | Admitting: Lab

## 2016-06-18 ENCOUNTER — Encounter (HOSPITAL_COMMUNITY): Payer: PPO | Attending: Hematology & Oncology | Admitting: Oncology

## 2016-06-18 ENCOUNTER — Encounter (HOSPITAL_COMMUNITY): Payer: PPO

## 2016-06-18 ENCOUNTER — Ambulatory Visit (HOSPITAL_COMMUNITY): Payer: Self-pay

## 2016-06-18 ENCOUNTER — Encounter (HOSPITAL_COMMUNITY): Payer: Self-pay | Admitting: Oncology

## 2016-06-18 VITALS — BP 121/81 | HR 51 | Temp 98.0°F | Resp 18 | Wt 160.6 lb

## 2016-06-18 DIAGNOSIS — G8929 Other chronic pain: Secondary | ICD-10-CM

## 2016-06-18 DIAGNOSIS — L409 Psoriasis, unspecified: Secondary | ICD-10-CM | POA: Insufficient documentation

## 2016-06-18 DIAGNOSIS — C61 Malignant neoplasm of prostate: Secondary | ICD-10-CM

## 2016-06-18 DIAGNOSIS — F329 Major depressive disorder, single episode, unspecified: Secondary | ICD-10-CM | POA: Insufficient documentation

## 2016-06-18 DIAGNOSIS — F32A Depression, unspecified: Secondary | ICD-10-CM | POA: Insufficient documentation

## 2016-06-18 DIAGNOSIS — C772 Secondary and unspecified malignant neoplasm of intra-abdominal lymph nodes: Secondary | ICD-10-CM | POA: Diagnosis not present

## 2016-06-18 DIAGNOSIS — M8710 Osteonecrosis due to drugs, unspecified bone: Secondary | ICD-10-CM

## 2016-06-18 DIAGNOSIS — F419 Anxiety disorder, unspecified: Secondary | ICD-10-CM | POA: Insufficient documentation

## 2016-06-18 LAB — CBC WITH DIFFERENTIAL/PLATELET
Basophils Absolute: 0.1 10*3/uL (ref 0.0–0.1)
Basophils Relative: 1 %
Eosinophils Absolute: 0.3 10*3/uL (ref 0.0–0.7)
Eosinophils Relative: 5 %
HCT: 34.7 % — ABNORMAL LOW (ref 39.0–52.0)
Hemoglobin: 12 g/dL — ABNORMAL LOW (ref 13.0–17.0)
Lymphocytes Relative: 39 %
Lymphs Abs: 2.4 10*3/uL (ref 0.7–4.0)
MCH: 30.8 pg (ref 26.0–34.0)
MCHC: 34.6 g/dL (ref 30.0–36.0)
MCV: 89.2 fL (ref 78.0–100.0)
Monocytes Absolute: 0.5 10*3/uL (ref 0.1–1.0)
Monocytes Relative: 9 %
Neutro Abs: 2.8 10*3/uL (ref 1.7–7.7)
Neutrophils Relative %: 46 %
Platelets: 263 10*3/uL (ref 150–400)
RBC: 3.89 MIL/uL — ABNORMAL LOW (ref 4.22–5.81)
RDW: 13.2 % (ref 11.5–15.5)
WBC: 6.1 10*3/uL (ref 4.0–10.5)

## 2016-06-18 LAB — COMPREHENSIVE METABOLIC PANEL
ALT: 11 U/L — ABNORMAL LOW (ref 17–63)
AST: 19 U/L (ref 15–41)
Albumin: 4.3 g/dL (ref 3.5–5.0)
Alkaline Phosphatase: 58 U/L (ref 38–126)
Anion gap: 7 (ref 5–15)
BUN: 7 mg/dL (ref 6–20)
CO2: 27 mmol/L (ref 22–32)
Calcium: 8.6 mg/dL — ABNORMAL LOW (ref 8.9–10.3)
Chloride: 104 mmol/L (ref 101–111)
Creatinine, Ser: 0.94 mg/dL (ref 0.61–1.24)
GFR calc Af Amer: >60 mL/min (ref >60)
GFR calc non Af Amer: >60 mL/min (ref >60)
Glucose, Bld: 102 mg/dL — ABNORMAL HIGH (ref 65–99)
Potassium: 3.5 mmol/L (ref 3.5–5.1)
Sodium: 138 mmol/L (ref 135–145)
Total Bilirubin: 0.7 mg/dL (ref 0.3–1.2)
Total Protein: 6.8 g/dL (ref 6.5–8.1)

## 2016-06-18 LAB — PSA: PSA: <0.01 ng/mL (ref 0.00–4.00)

## 2016-06-18 MED ORDER — OXYCODONE HCL 10 MG PO TABS: 10.0000 mg | ORAL_TABLET | ORAL | 0 refills | Status: DC | PRN

## 2016-06-18 MED ORDER — OXYCODONE HCL ER 60 MG PO T12A: 60.0000 mg | EXTENDED_RELEASE_TABLET | Freq: Three times a day (TID) | ORAL | 0 refills | Status: AC

## 2016-06-18 NOTE — Progress Notes (Signed)
Patient was informed that Lupron had not been re-authorized and was given the option to receive injection today without authorization with the chance that he may have to pay out of pocket for the Lupron or wait until next week after authorization is obtained. The cost is approximately 7000.00.  Patient opted to wait until authorization is obtained without any issues.

## 2016-06-18 NOTE — Progress Notes (Signed)
Lupron injection needs to be re-authorized so pt will be rescheduled for this injection

## 2016-06-18 NOTE — Patient Instructions (Addendum)
[place] at [facility] Discharge Instructions  RECOMMENDATIONS MADE BY THE CONSULTANT AND ANY TEST RESULTS WILL BE SENT TO YOUR REFERRING PHYSICIAN.  You were seen today by Kirby Crigler, PA-C We will refer you to [name] - Dermatology We will get you an appointment with a nutritionist Follow up in 3 months with labwork Get your injection in 6 months  Thank you for choosing [place] at [facility] to provide your oncology and hematology care.  To afford each patient quality time with our provider, please arrive at least 15 minutes before your scheduled appointment time.    If you have a lab appointment with the [facility] please come in thru the  Main Entrance and check in at the main information desk  You need to re-schedule your appointment should you arrive 10 or more minutes late.  We strive to give you quality time with our providers, and arriving late affects you and other patients whose appointments are after yours.  Also, if you no show three or more times for appointments you may be dismissed from the clinic at the providers discretion.     Again, thank you for choosing [facility].  Our hope is that these requests will decrease the amount of time that you wait before being seen by our physicians.       _____________________________________________________________  Should you have questions after your visit to [facility], please contact our office at [phone] between the hours of 8:30 a.m. and 4:30 p.m.  Voicemails left after 4:30 p.m. will not be returned until the following business day.  For prescription refill requests, have your pharmacy contact our office.       Resources For Cancer Patients and their Caregivers ? American Cancer Society: Can assist with transportation, wigs, general needs, runs Look Good Feel Better.        9784828028 ? Cancer Care: Provides financial assistance,  online support groups, medication/co-pay assistance.  1-800-813-HOPE 657 331 4992) ? [facility] Assists [place] cancer patients and their families through emotional , educational and financial support.  934-092-7248 ? [place] DSS Where to apply for food stamps, Medicaid and utility assistance. (928)140-7267 ? [employer]: Transportation to medical appointments. (714)861-5261 ? Social Security Administration: May apply for disability if have a Stage IV cancer. 270-440-8261 8160732288 ? [employer], Disability and Transit Services: Assists with nutrition, care and transit needs. [place] Support Programs: @10RELATIVEDAYS @ > Cancer Support Group  2nd Tuesday of the month 1pm-2pm, Journey Room  > Creative Journey  3rd Tuesday of the month 1130am-1pm, Journey Room  > Look Good Feel Better  1st Wednesday of the month 10am-12 noon, Journey Room (Call [facility] to register [phone])

## 2016-06-18 NOTE — Progress Notes (Unsigned)
Referral sent to Dr [name] 1/18 10:50

## 2016-06-19 ENCOUNTER — Other Ambulatory Visit (HOSPITAL_COMMUNITY): Payer: Self-pay | Admitting: Hematology & Oncology

## 2016-06-19 ENCOUNTER — Telehealth (HOSPITAL_COMMUNITY): Payer: Self-pay | Admitting: Hematology & Oncology

## 2016-06-19 DIAGNOSIS — M8710 Osteonecrosis due to drugs, unspecified bone: Secondary | ICD-10-CM

## 2016-06-19 DIAGNOSIS — F419 Anxiety disorder, unspecified: Secondary | ICD-10-CM

## 2016-06-19 DIAGNOSIS — C61 Malignant neoplasm of prostate: Secondary | ICD-10-CM

## 2016-06-19 NOTE — Telephone Encounter (Signed)
-----   Message from Louis Meckel sent at 06/19/2016  1:50 PM EST ----- Regarding: please call Patient called at 115pm today and would like Dr Mamie Nick or Gershon Mussel to call him.  Please call patient.  Thanks

## 2016-06-19 NOTE — Telephone Encounter (Signed)
CALLED [facility] TO SEE IF THEY RCVD THE URGENT AUTH REQ FAXED TO THEM ON 06/18/16. IT HAS BEEN RCVD BUT IS STILL IN REVIEW

## 2016-06-20 NOTE — Telephone Encounter (Signed)
Prescription faxed to [facility]

## 2016-06-21 ENCOUNTER — Other Ambulatory Visit: Payer: Self-pay | Admitting: Family Medicine

## 2016-06-23 ENCOUNTER — Other Ambulatory Visit: Payer: Self-pay | Admitting: Family Medicine

## 2016-06-28 DIAGNOSIS — L981 Factitial dermatitis: Secondary | ICD-10-CM | POA: Diagnosis not present

## 2016-06-28 DIAGNOSIS — L400 Psoriasis vulgaris: Secondary | ICD-10-CM | POA: Diagnosis not present

## 2016-06-29 ENCOUNTER — Encounter (HOSPITAL_COMMUNITY): Payer: Self-pay

## 2016-06-29 ENCOUNTER — Encounter (HOSPITAL_COMMUNITY): Payer: PPO

## 2016-06-29 ENCOUNTER — Encounter (HOSPITAL_BASED_OUTPATIENT_CLINIC_OR_DEPARTMENT_OTHER): Payer: PPO

## 2016-06-29 VITALS — BP 110/60 | HR 66 | Resp 16

## 2016-06-29 DIAGNOSIS — C61 Malignant neoplasm of prostate: Secondary | ICD-10-CM

## 2016-06-29 DIAGNOSIS — Z5111 Encounter for antineoplastic chemotherapy: Secondary | ICD-10-CM | POA: Diagnosis not present

## 2016-06-29 MED ORDER — LEUPROLIDE ACETATE (6 MONTH) 45 MG IM KIT
45.0000 mg | PACK | Freq: Once | INTRAMUSCULAR | Status: AC
  Administered 2016-06-29: 45 mg via INTRAMUSCULAR
  Filled 2016-06-29: qty 45

## 2016-06-29 NOTE — Progress Notes (Signed)
[name] [name] presents today for injection per MD orders. Lupron 45mg  administered IM in right Upper Arm. Administration without incident. Patient tolerated well. Vitals stable and discharged ambulatory from clinic.  Follow up as scheduled.

## 2016-06-29 NOTE — Progress Notes (Signed)
Nutrition Assessment   Reason for Assessment:   Patient referred for weight loss, no appetite  ASSESSMENT:  61 year old male with prostate cancer. Past medical history of asthma, COPD, CAD, depression, HTN, gastritis, GERD, osteonecrosis of jaw from drug, diverticulosis, skin lesions  Patient reports typically only eats one meal per day. "I don't really have an appetite."  Eats mainly reese cups, ice cream and jello with fruit in it.  Goes out to eat every other day and gets chicken and dumplings, cornbread, fried okra, tater tots.  Then says that he eats peanut butter and jelly sandwiches and frozen microwave meals.  Reports that he drinks 8-12 cups of water per day and drinks Brisk tea.  Reports that he has drank ensure but has not been drinking it lately.  "I got tired of it."   Reports that he has been cutting and splitting wood lately.  Patient picking at splinters in hand during visit.    No report of nausea, vomiting or change in bowel function.     Medications: carafate  Labs: glucose 102  Anthropometrics:   Height: 67 inches Weight: 160 pounds UBW: 160-180 pounds. Reports that he tends to loose weight this time of year.  Noted per chart on 11/27 wt of 164 lb, 11/22 164 lb, 10/13 164 lb, 7/13 162 lb, 10/2015 171 lb.  BMI: 25  2% weight loss in the last 2 months  Estimated Energy Needs  Kcals: 2100-2500 calories/d Protein: 88-110 g/d Fluid: 2.5 L/d  NUTRITION DIAGNOSIS: Unintentional weight loss related to poor appetite as evidenced by 2% weight loss in the last 2 months  MALNUTRITION DIAGNOSIS: none at this time   INTERVENTION:   Discussed strategies to increase calories and protein.  Discussed well balanced diet and ways to add nutritionally dense foods.  Handout provided. Teach back used. Discussed oral nutrition supplements and ways to change flavor of supplements. Discussed ensure program and patient given first complimentary case of ensure today.  Coupons  given as well  MONITORING, EVALUATION, GOAL: Patient will consume adequate calories and protein to maintain weight.   NEXT VISIT: March 9th at 11:30am  [name] [name] B. Zenia Resides, [place], [place] (pager)

## 2016-07-04 ENCOUNTER — Other Ambulatory Visit: Payer: Self-pay | Admitting: Family Medicine

## 2016-07-05 DIAGNOSIS — M8710 Osteonecrosis due to drugs, unspecified bone: Secondary | ICD-10-CM | POA: Diagnosis not present

## 2016-07-05 DIAGNOSIS — M4725 Other spondylosis with radiculopathy, thoracolumbar region: Secondary | ICD-10-CM | POA: Diagnosis not present

## 2016-07-05 DIAGNOSIS — C61 Malignant neoplasm of prostate: Secondary | ICD-10-CM | POA: Diagnosis not present

## 2016-07-05 DIAGNOSIS — Z9079 Acquired absence of other genital organ(s): Secondary | ICD-10-CM | POA: Diagnosis not present

## 2016-07-05 DIAGNOSIS — G894 Chronic pain syndrome: Secondary | ICD-10-CM | POA: Diagnosis not present

## 2016-07-05 DIAGNOSIS — D419 Neoplasm of uncertain behavior of unspecified urinary organ: Secondary | ICD-10-CM | POA: Diagnosis not present

## 2016-07-05 DIAGNOSIS — F419 Anxiety disorder, unspecified: Secondary | ICD-10-CM | POA: Diagnosis not present

## 2016-07-05 DIAGNOSIS — C772 Secondary and unspecified malignant neoplasm of intra-abdominal lymph nodes: Secondary | ICD-10-CM | POA: Diagnosis not present

## 2016-07-12 ENCOUNTER — Other Ambulatory Visit (HOSPITAL_COMMUNITY): Payer: Self-pay

## 2016-07-12 DIAGNOSIS — E876 Hypokalemia: Secondary | ICD-10-CM

## 2016-07-12 MED ORDER — POTASSIUM CHLORIDE CRYS ER 20 MEQ PO TBCR: 20.0000 meq | EXTENDED_RELEASE_TABLET | Freq: Two times a day (BID) | ORAL | 2 refills | Status: DC

## 2016-07-12 NOTE — Telephone Encounter (Signed)
Received refill request from patients pharmacy for potassium.. Chart checked, reviewed with PA and refilled.

## 2016-07-20 DIAGNOSIS — C772 Secondary and unspecified malignant neoplasm of intra-abdominal lymph nodes: Secondary | ICD-10-CM | POA: Diagnosis not present

## 2016-07-20 DIAGNOSIS — C61 Malignant neoplasm of prostate: Secondary | ICD-10-CM | POA: Diagnosis not present

## 2016-07-20 DIAGNOSIS — M85852 Other specified disorders of bone density and structure, left thigh: Secondary | ICD-10-CM | POA: Diagnosis not present

## 2016-07-20 DIAGNOSIS — M810 Age-related osteoporosis without current pathological fracture: Secondary | ICD-10-CM | POA: Diagnosis not present

## 2016-07-24 ENCOUNTER — Other Ambulatory Visit: Payer: Self-pay | Admitting: Family Medicine

## 2016-08-10 ENCOUNTER — Encounter (HOSPITAL_COMMUNITY): Payer: PPO | Attending: Hematology & Oncology

## 2016-08-10 ENCOUNTER — Encounter (HOSPITAL_COMMUNITY): Payer: Self-pay

## 2016-08-10 DIAGNOSIS — C61 Malignant neoplasm of prostate: Secondary | ICD-10-CM | POA: Insufficient documentation

## 2016-08-10 NOTE — Progress Notes (Signed)
Nutrition  Patient was no show for nutrition follow-up appointment today.  Hadas Jessop B. Zenia Resides, [place], [place] Registered Dietitian [phone] (pager)

## 2016-09-06 ENCOUNTER — Ambulatory Visit (INDEPENDENT_AMBULATORY_CARE_PROVIDER_SITE_OTHER): Payer: Self-pay | Admitting: Otolaryngology

## 2016-09-07 ENCOUNTER — Telehealth: Payer: Self-pay | Admitting: Cardiology

## 2016-09-07 MED ORDER — NITROGLYCERIN 0.4 MG SL SUBL: 0.4000 mg | SUBLINGUAL_TABLET | SUBLINGUAL | 3 refills | Status: DC | PRN

## 2016-09-07 NOTE — Telephone Encounter (Signed)
Done

## 2016-09-07 NOTE — Telephone Encounter (Signed)
Needs nitro called into [facility] in [place] Is completely out

## 2016-09-11 ENCOUNTER — Other Ambulatory Visit (HOSPITAL_COMMUNITY): Payer: Self-pay

## 2016-09-11 DIAGNOSIS — G629 Polyneuropathy, unspecified: Secondary | ICD-10-CM

## 2016-09-11 MED ORDER — GABAPENTIN 300 MG PO CAPS: ORAL_CAPSULE | ORAL | 3 refills | Status: DC

## 2016-09-11 NOTE — Telephone Encounter (Signed)
Received refill request from patients pharmacy for neurontin. Reviewed with PA-C, chart checked and refilled.

## 2016-09-13 ENCOUNTER — Ambulatory Visit (HOSPITAL_COMMUNITY): Payer: Self-pay | Admitting: Hematology & Oncology

## 2016-09-13 ENCOUNTER — Ambulatory Visit (HOSPITAL_COMMUNITY): Payer: Self-pay | Admitting: Oncology

## 2016-09-13 ENCOUNTER — Other Ambulatory Visit (HOSPITAL_COMMUNITY): Payer: Self-pay

## 2016-09-15 ENCOUNTER — Other Ambulatory Visit: Payer: Self-pay | Admitting: Family Medicine

## 2016-10-09 DIAGNOSIS — C772 Secondary and unspecified malignant neoplasm of intra-abdominal lymph nodes: Secondary | ICD-10-CM | POA: Diagnosis not present

## 2016-10-09 DIAGNOSIS — C61 Malignant neoplasm of prostate: Secondary | ICD-10-CM | POA: Diagnosis not present

## 2016-10-09 DIAGNOSIS — M8710 Osteonecrosis due to drugs, unspecified bone: Secondary | ICD-10-CM | POA: Diagnosis not present

## 2016-10-09 DIAGNOSIS — D649 Anemia, unspecified: Secondary | ICD-10-CM | POA: Diagnosis not present

## 2016-10-09 DIAGNOSIS — Z8546 Personal history of malignant neoplasm of prostate: Secondary | ICD-10-CM | POA: Diagnosis not present

## 2016-10-09 DIAGNOSIS — Z9079 Acquired absence of other genital organ(s): Secondary | ICD-10-CM | POA: Diagnosis not present

## 2016-10-09 DIAGNOSIS — Z08 Encounter for follow-up examination after completed treatment for malignant neoplasm: Secondary | ICD-10-CM | POA: Diagnosis not present

## 2016-10-09 DIAGNOSIS — F419 Anxiety disorder, unspecified: Secondary | ICD-10-CM | POA: Diagnosis not present

## 2016-10-09 DIAGNOSIS — M4725 Other spondylosis with radiculopathy, thoracolumbar region: Secondary | ICD-10-CM | POA: Diagnosis not present

## 2016-10-11 ENCOUNTER — Other Ambulatory Visit (HOSPITAL_COMMUNITY): Payer: Self-pay | Admitting: *Deleted

## 2016-10-11 DIAGNOSIS — E876 Hypokalemia: Secondary | ICD-10-CM

## 2016-10-11 MED ORDER — POTASSIUM CHLORIDE CRYS ER 20 MEQ PO TBCR: 20.0000 meq | EXTENDED_RELEASE_TABLET | Freq: Two times a day (BID) | ORAL | 2 refills | Status: DC

## 2016-10-15 ENCOUNTER — Ambulatory Visit (INDEPENDENT_AMBULATORY_CARE_PROVIDER_SITE_OTHER): Payer: PPO | Admitting: Otolaryngology

## 2016-10-15 DIAGNOSIS — H9313 Tinnitus, bilateral: Secondary | ICD-10-CM | POA: Diagnosis not present

## 2016-10-15 DIAGNOSIS — H903 Sensorineural hearing loss, bilateral: Secondary | ICD-10-CM

## 2016-10-16 ENCOUNTER — Other Ambulatory Visit (INDEPENDENT_AMBULATORY_CARE_PROVIDER_SITE_OTHER): Payer: Self-pay | Admitting: Otolaryngology

## 2016-10-16 DIAGNOSIS — H9313 Tinnitus, bilateral: Secondary | ICD-10-CM

## 2016-10-18 ENCOUNTER — Telehealth: Payer: Self-pay | Admitting: Family Medicine

## 2016-10-18 NOTE — Telephone Encounter (Signed)
Highly unlikely

## 2016-10-18 NOTE — Telephone Encounter (Signed)
Patient said he has had ongoing trouble with swallowing.  He has seen gastro for this and they are not finding anything.  He said there has been a history of gallbladder issues in his family and he wants to know if this could be the issue.

## 2016-10-18 NOTE — Telephone Encounter (Signed)
States that he saw gastro and there was nothing wrong, but still having pain (burning, irritating, cramping) he states that he is only able to eat jell-o. He states that he will schedule appointment. Does not want to go back to Dr. Ardis Hughs.

## 2016-10-23 ENCOUNTER — Encounter: Payer: Self-pay | Admitting: Family Medicine

## 2016-10-23 ENCOUNTER — Ambulatory Visit (INDEPENDENT_AMBULATORY_CARE_PROVIDER_SITE_OTHER): Payer: PPO | Admitting: Family Medicine

## 2016-10-23 VITALS — BP 106/74 | Ht 67.0 in | Wt 168.1 lb

## 2016-10-23 DIAGNOSIS — R1013 Epigastric pain: Secondary | ICD-10-CM | POA: Diagnosis not present

## 2016-10-23 NOTE — Progress Notes (Signed)
   Subjective:    Patient ID: [name] [name], male    DOB: 1955-08-04, 61 y.o.   MRN: [id number]  HPI Patient in today with c/o difficulty swallowing. Patient states that symptoms are aggravated by caffeine, stress. Burning, epigastric region, worse with caffeine drinks,   Occurs day and night  Fried foods makes it worse  Had upper endoscopy via g I doc  Patient has concerns of possible issues with gallbladder.   Takes the reflux meds,   Compliant with it   Patient notes midepigastric discomfort. Seems to be somewhat worse after meals. Aching at times. Did go see the gastrologist. Had upper endoscopy. No major difficulty seen at that time. Patient gives no history of recent alcohol or tobacco use/abuse  Patient is compared notes with a follow friend with similar symptoms and they had gallstones. Concerned that he has them.   Lasts all day sometimes   Review of Systems No headache, no major weight loss or weight gain, no chest pain no back pain abdominal pain no change in bowel habits complete ROS otherwise negative     Objective:   Physical Exam Alert and oriented, vitals reviewed and stable, NAD ENT-TM's and ext canals WNL bilat via otoscopic exam Soft palate, tonsils and post pharynx WNL via oropharyngeal exam Neck-symmetric, no masses; thyroid nonpalpable and nontender Pulmonary-no tachypnea or accessory muscle use; Clear without wheezes via auscultation Card--no abnrml murmurs, rhythm reg and rate WNL Carotid pulses symmetric, without bruits Mild epigastric tenderness to deep palpation       Assessment & Plan:  Impression probable gastritis/esophagitis patient maintain meds. We'll do ultrasound on her patient's concerns., Further recommendations based on results WSL

## 2016-10-24 ENCOUNTER — Ambulatory Visit (HOSPITAL_COMMUNITY)
Admission: RE | Admit: 2016-10-24 | Discharge: 2016-10-24 | Disposition: A | Discharge status: Home / Self Care | Payer: PPO | Source: Ambulatory Visit | Attending: Otolaryngology | Admitting: Otolaryngology

## 2016-10-24 ENCOUNTER — Other Ambulatory Visit (HOSPITAL_COMMUNITY)
Admission: RE | Admit: 2016-10-24 | Discharge: 2016-10-24 | Disposition: A | Discharge status: Home / Self Care | Payer: PPO | Source: Ambulatory Visit | Attending: Otolaryngology | Admitting: Otolaryngology

## 2016-10-24 DIAGNOSIS — H93A9 Pulsatile tinnitus, unspecified ear: Secondary | ICD-10-CM | POA: Insufficient documentation

## 2016-10-24 DIAGNOSIS — H9313 Tinnitus, bilateral: Secondary | ICD-10-CM

## 2016-10-24 LAB — POCT I-STAT CREATININE: Creatinine, Ser: 0.9 mg/dL (ref 0.61–1.24)

## 2016-10-24 MED ORDER — GADOBENATE DIMEGLUMINE 529 MG/ML IV SOLN
15.0000 mL | Freq: Once | INTRAVENOUS | Status: AC | PRN
  Administered 2016-10-24: 15 mL via INTRAVENOUS

## 2016-10-25 ENCOUNTER — Ambulatory Visit (HOSPITAL_COMMUNITY)
Admission: RE | Admit: 2016-10-25 | Discharge: 2016-10-25 | Disposition: A | Discharge status: Home / Self Care | Payer: PPO | Source: Ambulatory Visit | Attending: Family Medicine | Admitting: Family Medicine

## 2016-10-25 DIAGNOSIS — K824 Cholesterolosis of gallbladder: Secondary | ICD-10-CM | POA: Insufficient documentation

## 2016-10-25 DIAGNOSIS — R1013 Epigastric pain: Secondary | ICD-10-CM | POA: Insufficient documentation

## 2016-11-01 ENCOUNTER — Other Ambulatory Visit: Payer: Self-pay | Admitting: Family Medicine

## 2016-11-07 ENCOUNTER — Other Ambulatory Visit (HOSPITAL_COMMUNITY): Payer: Self-pay

## 2016-11-07 DIAGNOSIS — E876 Hypokalemia: Secondary | ICD-10-CM

## 2016-11-07 MED ORDER — POTASSIUM CHLORIDE CRYS ER 20 MEQ PO TBCR: 20.0000 meq | EXTENDED_RELEASE_TABLET | Freq: Two times a day (BID) | ORAL | 0 refills | Status: DC

## 2016-11-07 NOTE — Telephone Encounter (Signed)
Received refill request from patients pharmacy for 90 day supply of potassium. Reviewed with provider, chart checked and refilled.

## 2016-11-12 ENCOUNTER — Encounter: Payer: Self-pay | Admitting: Cardiology

## 2016-11-12 ENCOUNTER — Ambulatory Visit (INDEPENDENT_AMBULATORY_CARE_PROVIDER_SITE_OTHER): Payer: PPO | Admitting: Cardiology

## 2016-11-12 VITALS — BP 122/84 | HR 57 | Ht 69.0 in | Wt 171.0 lb

## 2016-11-12 DIAGNOSIS — I25119 Atherosclerotic heart disease of native coronary artery with unspecified angina pectoris: Secondary | ICD-10-CM | POA: Diagnosis not present

## 2016-11-12 DIAGNOSIS — F17201 Nicotine dependence, unspecified, in remission: Secondary | ICD-10-CM

## 2016-11-12 DIAGNOSIS — E782 Mixed hyperlipidemia: Secondary | ICD-10-CM

## 2016-11-12 DIAGNOSIS — I10 Essential (primary) hypertension: Secondary | ICD-10-CM

## 2016-11-12 NOTE — Patient Instructions (Signed)

## 2016-11-12 NOTE — Progress Notes (Signed)
Cardiology Office Note  Date: 11/12/2016   ID: [name] [name], DOB 10/19/1955, MRN [id number]  PCP: Mikey Kirschner, MD  Primary Cardiologist: Rozann Lesches, MD   Chief Complaint  Patient presents with  . Coronary Artery Disease    History of Present Illness: [name] [name] is a 61 y.o. male last seen in June 2017. He presents for a routine follow-up visit. States that he was having a lot of emotional stress a few months ago, had increased angina and nitroglycerin use at that time. Since then he has been walking an hour twice a day and being more active, feels much better without active angina.  I personally reviewed his ECG today which shows sinus bradycardia.  Current cardiac regimen includes aspirin, Toprol-XL, Pravachol, and as needed nitroglycerin.  He is following with [name] for oncology care. He sees him in [place].  Past Medical History:  Diagnosis Date  . Anxiety   . Asthma   . COPD (chronic obstructive pulmonary disease) ([place])   . Coronary artery disease    DES to LAD 1/12  . DDD (degenerative disc disease)   . Depression   . Drug reaction    Pamidronate - induced skin toxicity, Zometa - induced toxicity  . Essential hypertension   . Gastritis   . GERD (gastroesophageal reflux disease)   . Horseshoe kidney    Single kidney  . Internal hemorrhoids    Colonoscopy 1/12  . Myocardial infarction ([place])    2012  . Osteoarthritis   . 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 Prostate cancer ([place])    Metastatic, stage IV  - [name]  . Psoriasis   . Sigmoid diverticulosis   . Tubular adenoma    Colonoscopy 1/12    Past Surgical History:  Procedure Laterality Date  . ANTERIOR FUSION CERVICAL SPINE    . APPENDECTOMY    . ETHMOIDECTOMY Left 09/28/2013   Procedure: LEFT ENDOSCOPIC ANTERIOR ETHMOIDECTOMY;  Surgeon: [name], MD;  Location: [facility];  Service: ENT;  Laterality: Left;  . HEART STENTS  06/2010   2  .  LEFT FOOT SURGERY    . MAXILLARY ANTROSTOMY Left 09/28/2013   Procedure: LEFT ENDOSCOPIC MAXILLARY ANTROSTOMY WITH REMOVAL OF TISSUE;  Surgeon: [name], MD;  Location: [facility];  Service: ENT;  Laterality: Left;  . POSTERIOR FUSION CERVICAL SPINE    . PROSTATECTOMY  02/28/2006   [name] in [place], [place].    Current Outpatient Prescriptions  Medication Sig Dispense Refill  . ALPRAZolam (XANAX) 0.5 MG tablet TAKE 1 TABLET BY MOUTH FOUR TIMES DAILY AS NEEDED FOR ANXIETY 120 tablet 5  . aspirin EC 81 MG EC tablet Take 1 tablet (81 mg total) by mouth daily.    . calcipotriene (DOVONOX) 0.005 % cream Apply topically 2 (two) times daily.     . calcipotriene-betamethasone (TACLONEX) ointment Apply 1 application topically daily. 60 g 3  . CAPEX 0.01 % SHAM Apply 1 application topically as needed (psoriasis).     . chlorhexidine (PERIDEX) 0.12 % solution USE 15 ML BY MOUTH TO SWISH AND SPIT TWICE DAILY 473 mL 0  . Clobetasol Propionate (TEMOVATE) 0.05 % external spray APPLY EXTERNALLY TO THE AFFECTED AREA UP TO TWICE DAILY AS NEEDED 59 mL 0  . Clobetasol Propionate 0.05 % shampoo USE SHAMPOO AS NEEDED 118 mL 11  . Elastic Bandages & Supports (LUMBAR BACK BRACE/SUPPORT PAD) MISC 1 each  by Does not apply route as directed. 1 each 1  . fluticasone (FLONASE) 50 MCG/ACT nasal spray Place 1 spray into both nostrils daily.  0  . gabapentin (NEURONTIN) 300 MG capsule TAKE 3 CAPSULES AT BEDTIME 90 capsule 3  . leuprolide, 6 Month, (ELIGARD) 45 MG injection Inject 45 mg into the skin every 6 (six) months.     . meclizine (ANTIVERT) 25 MG tablet Take 25 mg by mouth 3 (three) times daily as needed for dizziness. Reported on 11/22/2015    . metoprolol succinate (TOPROL-XL) 25 MG 24 hr tablet TAKE 1 TABLET BY MOUTH EVERY DAY 30 tablet 5  . mupirocin ointment (BACTROBAN) 2 % Apply 1 application topically 2 (two) times daily. To rash 30 g 0  . nitroGLYCERIN (NITROSTAT) 0.4 MG SL tablet Place 1  tablet (0.4 mg total) under the tongue every 5 (five) minutes as needed for chest pain. 25 tablet 3  . oxyCODONE (OXYCONTIN) 60 MG 12 hr tablet Take 60 mg by mouth every 8 (eight) hours. 90 each 0  . Oxycodone HCl 10 MG TABS Take 1-2 tablets (10-20 mg total) by mouth every 4 (four) hours as needed. 180 tablet 0  . pantoprazole (PROTONIX) 40 MG tablet TAKE 1 TABLET(40 MG) BY MOUTH DAILY 30 tablet 2  . potassium chloride SA (K-DUR,KLOR-CON) 20 MEQ tablet Take 1 tablet (20 mEq total) by mouth 2 (two) times daily. 180 tablet 0  . pravastatin (PRAVACHOL) 80 MG tablet TAKE 1 TABLET(80 MG) BY MOUTH DAILY 30 tablet 5  . PROAIR HFA 108 (90 BASE) MCG/ACT inhaler inhale 1 puff every 4 hours WITH AEROCHAMBER 8.5 g 1   Current Facility-Administered Medications  Medication Dose Route Frequency Provider Last Rate Last Dose  . 0.9 %  sodium chloride infusion  500 mL Intravenous Continuous Milus Banister, MD       Allergies:  Other; Zoledronic acid; and Pamidronate   Social History: The patient  reports that he quit smoking about 10 years ago. His smoking use included Cigarettes. He has a 15.00 pack-year smoking history. He has never used smokeless tobacco. He reports that he does not drink alcohol or use drugs.   ROS:  Please see the history of present illness. Otherwise, complete review of systems is positive for intermittent stress and anxiety.  All other systems are reviewed and negative.   Physical Exam: VS:  BP 122/84   Pulse (!) 57   Ht 5\' 9"  (1.753 m)   Wt 171 lb (77.6 kg)   BMI 25.25 kg/m , BMI Body mass index is 25.25 kg/m.  Wt Readings from Last 3 Encounters:  11/12/16 171 lb (77.6 kg)  10/23/16 168 lb 2 oz (76.3 kg)  06/18/16 160 lb 9.6 oz (72.8 kg)    General: Patient appears comfortable at rest. HEENT: Conjunctiva and lids normal, oropharynx clear. Neck: Supple, no elevated JVP or carotid bruits, no thyromegaly. Lungs: Clear to auscultation, nonlabored breathing at rest. Cardiac:  Regular rate and rhythm, no S3 or significant systolic murmur, no pericardial rub. Abdomen: Soft, nontender, bowel sounds present. Extremities: No pitting edema, distal pulses 2+. Skin: Warm and dry. Musculoskeletal: No kyphosis. Neuropsychiatric: Alert and oriented x3, affect grossly appropriate.  ECG: I personally reviewed the tracing from 11/10/2015 which showed sinus bradycardia.  Recent Labwork: 06/18/2016: ALT 11; AST 19; BUN 7; Hemoglobin 12.0; Platelets 263; Potassium 3.5; Sodium 138 10/24/2016: Creatinine, Ser 0.90     Component Value Date/Time   CHOL 150 04/20/2016 1335   TRIG  104 04/20/2016 1335   HDL 45 04/20/2016 1335   CHOLHDL 3.3 04/20/2016 1335   CHOLHDL 3.2 03/17/2014 1245   VLDL 30 03/17/2014 1245   LDLCALC 84 04/20/2016 1335    Other Studies Reviewed Today:  Echocardiogram 10/23/2012: Study Conclusions  - Left ventricle: The cavity size was normal. There was mild focal basal hypertrophy of the septum. Systolic function was normal. Wall motion was normal; there were no regional wall motion abnormalities. - Aortic valve: Mildly calcified annulus. Trileaflet; normal thickness leaflets. - Atrial septum: No defect or patent foramen ovale was identified. - Pulmonary arteries: PA peak pressure: 27mm Hg (S). Impressions:  - Compared to the prior study of 06/28/10, there has been no significant interval change.  Assessment and Plan:  1. CAD status post DES to the LAD in 2012. He continues to do very well, recently has been more active, walking for an hour twice a day without angina symptoms. I reviewed his ECG which is unremarkable. Plan to continue medical therapy and observation.  2. Hyperlipidemia on Pravachol, last LDL 84.  3. Essential hypertension, blood pressure is well controlled today.  4. Tobacco abuse in remission.  Current medicines were reviewed with the patient today.  Disposition: Follow-up in one year, sooner if  needed.  Signed, Satira Sark, MD, [facility] 11/12/2016 1:51 PM    [facility] [address], [place], [place] [address] Phone: [phone]; Fax: [phone]

## 2016-12-11 DIAGNOSIS — C772 Secondary and unspecified malignant neoplasm of intra-abdominal lymph nodes: Secondary | ICD-10-CM | POA: Diagnosis not present

## 2016-12-11 DIAGNOSIS — Z981 Arthrodesis status: Secondary | ICD-10-CM | POA: Diagnosis not present

## 2016-12-11 DIAGNOSIS — J449 Chronic obstructive pulmonary disease, unspecified: Secondary | ICD-10-CM | POA: Diagnosis not present

## 2016-12-11 DIAGNOSIS — R109 Unspecified abdominal pain: Secondary | ICD-10-CM | POA: Diagnosis not present

## 2016-12-11 DIAGNOSIS — Z87891 Personal history of nicotine dependence: Secondary | ICD-10-CM | POA: Diagnosis not present

## 2016-12-11 DIAGNOSIS — Z5321 Procedure and treatment not carried out due to patient leaving prior to being seen by health care provider: Secondary | ICD-10-CM | POA: Diagnosis not present

## 2016-12-11 DIAGNOSIS — E7800 Pure hypercholesterolemia, unspecified: Secondary | ICD-10-CM | POA: Diagnosis not present

## 2016-12-11 DIAGNOSIS — Z79891 Long term (current) use of opiate analgesic: Secondary | ICD-10-CM | POA: Diagnosis not present

## 2016-12-11 DIAGNOSIS — R079 Chest pain, unspecified: Secondary | ICD-10-CM | POA: Diagnosis not present

## 2016-12-11 DIAGNOSIS — R001 Bradycardia, unspecified: Secondary | ICD-10-CM | POA: Diagnosis not present

## 2016-12-11 DIAGNOSIS — I10 Essential (primary) hypertension: Secondary | ICD-10-CM | POA: Diagnosis not present

## 2016-12-11 DIAGNOSIS — K639 Disease of intestine, unspecified: Secondary | ICD-10-CM | POA: Diagnosis not present

## 2016-12-11 DIAGNOSIS — R1084 Generalized abdominal pain: Secondary | ICD-10-CM | POA: Diagnosis not present

## 2016-12-11 DIAGNOSIS — R197 Diarrhea, unspecified: Secondary | ICD-10-CM | POA: Diagnosis not present

## 2016-12-11 DIAGNOSIS — K5900 Constipation, unspecified: Secondary | ICD-10-CM | POA: Diagnosis not present

## 2016-12-11 DIAGNOSIS — Z888 Allergy status to other drugs, medicaments and biological substances status: Secondary | ICD-10-CM | POA: Diagnosis not present

## 2016-12-11 DIAGNOSIS — R1110 Vomiting, unspecified: Secondary | ICD-10-CM | POA: Diagnosis not present

## 2016-12-11 DIAGNOSIS — Z8546 Personal history of malignant neoplasm of prostate: Secondary | ICD-10-CM | POA: Diagnosis not present

## 2016-12-11 DIAGNOSIS — R112 Nausea with vomiting, unspecified: Secondary | ICD-10-CM | POA: Insufficient documentation

## 2016-12-11 DIAGNOSIS — C61 Malignant neoplasm of prostate: Secondary | ICD-10-CM | POA: Diagnosis not present

## 2016-12-11 DIAGNOSIS — R0789 Other chest pain: Secondary | ICD-10-CM | POA: Diagnosis not present

## 2016-12-11 DIAGNOSIS — Z91048 Other nonmedicinal substance allergy status: Secondary | ICD-10-CM | POA: Diagnosis not present

## 2016-12-11 DIAGNOSIS — N200 Calculus of kidney: Secondary | ICD-10-CM | POA: Diagnosis not present

## 2016-12-11 DIAGNOSIS — Q631 Lobulated, fused and horseshoe kidney: Secondary | ICD-10-CM | POA: Diagnosis not present

## 2016-12-11 DIAGNOSIS — K6289 Other specified diseases of anus and rectum: Secondary | ICD-10-CM | POA: Diagnosis not present

## 2016-12-11 DIAGNOSIS — Z79899 Other long term (current) drug therapy: Secondary | ICD-10-CM | POA: Diagnosis not present

## 2016-12-11 DIAGNOSIS — Z7951 Long term (current) use of inhaled steroids: Secondary | ICD-10-CM | POA: Diagnosis not present

## 2016-12-11 DIAGNOSIS — M810 Age-related osteoporosis without current pathological fracture: Secondary | ICD-10-CM | POA: Diagnosis not present

## 2016-12-11 DIAGNOSIS — E860 Dehydration: Secondary | ICD-10-CM | POA: Diagnosis not present

## 2016-12-11 DIAGNOSIS — G894 Chronic pain syndrome: Secondary | ICD-10-CM | POA: Diagnosis not present

## 2016-12-15 ENCOUNTER — Other Ambulatory Visit: Payer: Self-pay | Admitting: Family Medicine

## 2016-12-17 ENCOUNTER — Ambulatory Visit (HOSPITAL_COMMUNITY): Payer: Self-pay

## 2017-01-16 DIAGNOSIS — C772 Secondary and unspecified malignant neoplasm of intra-abdominal lymph nodes: Secondary | ICD-10-CM | POA: Diagnosis not present

## 2017-01-16 DIAGNOSIS — C61 Malignant neoplasm of prostate: Secondary | ICD-10-CM | POA: Diagnosis not present

## 2017-01-16 DIAGNOSIS — Z79818 Long term (current) use of other agents affecting estrogen receptors and estrogen levels: Secondary | ICD-10-CM | POA: Diagnosis not present

## 2017-01-20 ENCOUNTER — Other Ambulatory Visit: Payer: Self-pay | Admitting: Family Medicine

## 2017-01-21 ENCOUNTER — Other Ambulatory Visit: Payer: Self-pay | Admitting: Family Medicine

## 2017-01-22 DIAGNOSIS — Z79818 Long term (current) use of other agents affecting estrogen receptors and estrogen levels: Secondary | ICD-10-CM | POA: Diagnosis not present

## 2017-01-22 DIAGNOSIS — C772 Secondary and unspecified malignant neoplasm of intra-abdominal lymph nodes: Secondary | ICD-10-CM | POA: Diagnosis not present

## 2017-01-22 DIAGNOSIS — C61 Malignant neoplasm of prostate: Secondary | ICD-10-CM | POA: Diagnosis not present

## 2017-02-06 ENCOUNTER — Other Ambulatory Visit (HOSPITAL_COMMUNITY): Payer: Self-pay

## 2017-02-06 DIAGNOSIS — E876 Hypokalemia: Secondary | ICD-10-CM

## 2017-02-06 MED ORDER — POTASSIUM CHLORIDE CRYS ER 20 MEQ PO TBCR: 20.0000 meq | EXTENDED_RELEASE_TABLET | Freq: Two times a day (BID) | ORAL | 0 refills | Status: DC

## 2017-02-06 NOTE — Telephone Encounter (Signed)
Received refill request from patients pharmacy for Potassium. Reviewed with provider, chart checked and refilled.

## 2017-02-26 ENCOUNTER — Other Ambulatory Visit: Payer: Self-pay | Admitting: Family Medicine

## 2017-03-04 ENCOUNTER — Ambulatory Visit (INDEPENDENT_AMBULATORY_CARE_PROVIDER_SITE_OTHER): Payer: PPO | Admitting: Otolaryngology

## 2017-03-04 DIAGNOSIS — R42 Dizziness and giddiness: Secondary | ICD-10-CM

## 2017-03-04 DIAGNOSIS — H8111 Benign paroxysmal vertigo, right ear: Secondary | ICD-10-CM

## 2017-03-13 ENCOUNTER — Ambulatory Visit: Payer: PPO

## 2017-03-14 ENCOUNTER — Ambulatory Visit: Payer: PPO

## 2017-03-15 ENCOUNTER — Ambulatory Visit: Payer: PPO

## 2017-03-15 ENCOUNTER — Ambulatory Visit (INDEPENDENT_AMBULATORY_CARE_PROVIDER_SITE_OTHER): Payer: PPO

## 2017-03-15 DIAGNOSIS — Z23 Encounter for immunization: Secondary | ICD-10-CM

## 2017-03-25 ENCOUNTER — Other Ambulatory Visit: Payer: Self-pay | Admitting: Family Medicine

## 2017-03-26 ENCOUNTER — Other Ambulatory Visit: Payer: Self-pay | Admitting: Family Medicine

## 2017-04-01 ENCOUNTER — Ambulatory Visit (INDEPENDENT_AMBULATORY_CARE_PROVIDER_SITE_OTHER): Payer: PPO | Admitting: Otolaryngology

## 2017-04-01 DIAGNOSIS — H8111 Benign paroxysmal vertigo, right ear: Secondary | ICD-10-CM

## 2017-04-09 DIAGNOSIS — M8710 Osteonecrosis due to drugs, unspecified bone: Secondary | ICD-10-CM | POA: Diagnosis not present

## 2017-04-09 DIAGNOSIS — C772 Secondary and unspecified malignant neoplasm of intra-abdominal lymph nodes: Secondary | ICD-10-CM | POA: Diagnosis not present

## 2017-04-09 DIAGNOSIS — G894 Chronic pain syndrome: Secondary | ICD-10-CM | POA: Diagnosis not present

## 2017-04-09 DIAGNOSIS — C61 Malignant neoplasm of prostate: Secondary | ICD-10-CM | POA: Diagnosis not present

## 2017-04-09 DIAGNOSIS — M4725 Other spondylosis with radiculopathy, thoracolumbar region: Secondary | ICD-10-CM | POA: Diagnosis not present

## 2017-04-09 DIAGNOSIS — F419 Anxiety disorder, unspecified: Secondary | ICD-10-CM | POA: Diagnosis not present

## 2017-04-09 DIAGNOSIS — Z9079 Acquired absence of other genital organ(s): Secondary | ICD-10-CM | POA: Diagnosis not present

## 2017-04-22 ENCOUNTER — Other Ambulatory Visit: Payer: Self-pay | Admitting: Family Medicine

## 2017-04-26 ENCOUNTER — Other Ambulatory Visit: Payer: Self-pay | Admitting: Family Medicine

## 2017-06-03 ENCOUNTER — Ambulatory Visit (INDEPENDENT_AMBULATORY_CARE_PROVIDER_SITE_OTHER): Payer: PPO | Admitting: Otolaryngology

## 2017-06-10 DIAGNOSIS — C772 Secondary and unspecified malignant neoplasm of intra-abdominal lymph nodes: Secondary | ICD-10-CM | POA: Diagnosis not present

## 2017-06-10 DIAGNOSIS — Z5181 Encounter for therapeutic drug level monitoring: Secondary | ICD-10-CM | POA: Diagnosis not present

## 2017-06-10 DIAGNOSIS — Z79818 Long term (current) use of other agents affecting estrogen receptors and estrogen levels: Secondary | ICD-10-CM | POA: Diagnosis not present

## 2017-06-10 DIAGNOSIS — R05 Cough: Secondary | ICD-10-CM | POA: Diagnosis not present

## 2017-06-10 DIAGNOSIS — J069 Acute upper respiratory infection, unspecified: Secondary | ICD-10-CM | POA: Insufficient documentation

## 2017-06-10 DIAGNOSIS — G894 Chronic pain syndrome: Secondary | ICD-10-CM | POA: Diagnosis not present

## 2017-06-10 DIAGNOSIS — J209 Acute bronchitis, unspecified: Secondary | ICD-10-CM | POA: Diagnosis not present

## 2017-06-10 DIAGNOSIS — Z9079 Acquired absence of other genital organ(s): Secondary | ICD-10-CM | POA: Diagnosis not present

## 2017-06-10 DIAGNOSIS — M8710 Osteonecrosis due to drugs, unspecified bone: Secondary | ICD-10-CM | POA: Diagnosis not present

## 2017-06-10 DIAGNOSIS — M4725 Other spondylosis with radiculopathy, thoracolumbar region: Secondary | ICD-10-CM | POA: Diagnosis not present

## 2017-06-10 DIAGNOSIS — C61 Malignant neoplasm of prostate: Secondary | ICD-10-CM | POA: Diagnosis not present

## 2017-06-11 DIAGNOSIS — R768 Other specified abnormal immunological findings in serum: Secondary | ICD-10-CM | POA: Insufficient documentation

## 2017-06-17 DIAGNOSIS — R05 Cough: Secondary | ICD-10-CM | POA: Diagnosis not present

## 2017-06-17 DIAGNOSIS — C772 Secondary and unspecified malignant neoplasm of intra-abdominal lymph nodes: Secondary | ICD-10-CM | POA: Diagnosis not present

## 2017-06-17 DIAGNOSIS — Z79818 Long term (current) use of other agents affecting estrogen receptors and estrogen levels: Secondary | ICD-10-CM | POA: Diagnosis not present

## 2017-06-17 DIAGNOSIS — Z5181 Encounter for therapeutic drug level monitoring: Secondary | ICD-10-CM | POA: Diagnosis not present

## 2017-06-17 DIAGNOSIS — J069 Acute upper respiratory infection, unspecified: Secondary | ICD-10-CM | POA: Diagnosis not present

## 2017-06-17 DIAGNOSIS — Z9079 Acquired absence of other genital organ(s): Secondary | ICD-10-CM | POA: Diagnosis not present

## 2017-06-17 DIAGNOSIS — J209 Acute bronchitis, unspecified: Secondary | ICD-10-CM | POA: Diagnosis not present

## 2017-06-17 DIAGNOSIS — R918 Other nonspecific abnormal finding of lung field: Secondary | ICD-10-CM | POA: Diagnosis not present

## 2017-06-17 DIAGNOSIS — C61 Malignant neoplasm of prostate: Secondary | ICD-10-CM | POA: Diagnosis not present

## 2017-06-20 ENCOUNTER — Other Ambulatory Visit: Payer: Self-pay | Admitting: Family Medicine

## 2017-06-23 ENCOUNTER — Other Ambulatory Visit: Payer: Self-pay | Admitting: Family Medicine

## 2017-06-24 ENCOUNTER — Other Ambulatory Visit: Payer: Self-pay | Admitting: Family Medicine

## 2017-07-22 DIAGNOSIS — R918 Other nonspecific abnormal finding of lung field: Secondary | ICD-10-CM | POA: Insufficient documentation

## 2017-07-23 ENCOUNTER — Other Ambulatory Visit: Payer: Self-pay | Admitting: Family Medicine

## 2017-07-23 DIAGNOSIS — J209 Acute bronchitis, unspecified: Secondary | ICD-10-CM | POA: Diagnosis not present

## 2017-07-23 DIAGNOSIS — R918 Other nonspecific abnormal finding of lung field: Secondary | ICD-10-CM | POA: Diagnosis not present

## 2017-08-12 DIAGNOSIS — C61 Malignant neoplasm of prostate: Secondary | ICD-10-CM | POA: Diagnosis not present

## 2017-08-21 ENCOUNTER — Other Ambulatory Visit: Payer: Self-pay | Admitting: Cardiology

## 2017-08-21 NOTE — Telephone Encounter (Signed)
Patient contacted office stating he wanted his protonix refilled. After speaking with patient a very lengthy amount of time, advised patient that this is a medication Dr. Malachy Moan office refills. Contacted Dr. Malachy Moan office and they said patient needs an appointment. Patient stated that Dr. Chuck Hint office needed to refill this medication due to it being a heart medication. Advised patient this was for acid reflux and Dr. Malachy Moan office would gladly refill if patient would make an appointment. Patient refused and stated he wanted an appointment here and for Dr. Domenic Polite to refill. Advised patient next appointment with Dr. Domenic Polite is not until May. Advised patient again this is not a cardiac medication and this is something [name] has been filling for him since 2014. Patient then starts complaining of chest pain and states he had to take a nitro about 20 minutes and has had no relief. Patient states pain is a 7/10. Advised patient if he was having active chest pain he needed to call 911 and go to the ER. Patient verbalized understanding and stated he would think about it.

## 2017-08-21 NOTE — Telephone Encounter (Signed)
Patient called requesting to have a refill on pantoprazole (PROTONIX) 40 MG tablet  [place], [place]

## 2017-08-23 ENCOUNTER — Encounter: Payer: Self-pay | Admitting: Family Medicine

## 2017-08-23 ENCOUNTER — Ambulatory Visit (INDEPENDENT_AMBULATORY_CARE_PROVIDER_SITE_OTHER): Payer: PPO | Admitting: Family Medicine

## 2017-08-23 VITALS — BP 122/80 | Ht 67.0 in | Wt 176.0 lb

## 2017-08-23 DIAGNOSIS — E782 Mixed hyperlipidemia: Secondary | ICD-10-CM | POA: Diagnosis not present

## 2017-08-23 DIAGNOSIS — Z0000 Encounter for general adult medical examination without abnormal findings: Secondary | ICD-10-CM | POA: Diagnosis not present

## 2017-08-23 DIAGNOSIS — K219 Gastro-esophageal reflux disease without esophagitis: Secondary | ICD-10-CM

## 2017-08-23 DIAGNOSIS — Z13220 Encounter for screening for lipoid disorders: Secondary | ICD-10-CM

## 2017-08-23 DIAGNOSIS — Z79899 Other long term (current) drug therapy: Secondary | ICD-10-CM

## 2017-08-23 MED ORDER — PANTOPRAZOLE SODIUM 40 MG PO TBEC: 40.0000 mg | DELAYED_RELEASE_TABLET | Freq: Every day | ORAL | 11 refills | Status: DC

## 2017-08-23 NOTE — Progress Notes (Signed)
   Subjective:    Patient ID: [name] [name], male    DOB: [birth year], 62 y.o.   MRN: [id number]  HPI Patient is here today to follow up on gerd. He states he takes Pantoprazole 40 mg daily. He is also taking a probiotic when he feels the reflux coming on.   flu shot last fall   AWV- Annual Wellness Visit  The patient was seen for their annual wellness visit. The patient's past medical history, surgical history, and family history were reviewed. Pertinent vaccines were reviewed ( tetanus, pneumonia, shingles, flu) The patient's medication list was reviewed and updated.  The height and weight were entered.  BMI recorded in electronic record elsewhere  Cognitive screening was completed. Outcome of Mini - Cog: passed   Falls /depression screening electronically recorded within record elsewhere  Current tobacco usage:none (All patients who use tobacco were given written and verbal information on quitting)  Recent listing of emergency department/hospitalizations over the past year were reviewed.  current specialist the patient sees on a regular basis:    Medicare annual wellness visit patient questionnaire was reviewed.  A written screening schedule for the patient for the next 5-10 years was given. Appropriate discussion of followup regarding next visit was discussed.     Review of Systems  Constitutional: Negative for activity change, appetite change and fever.  HENT: Negative for congestion and rhinorrhea.   Eyes: Negative for discharge.  Respiratory: Negative for cough and wheezing.   Cardiovascular: Negative for chest pain.  Gastrointestinal: Negative for abdominal pain, blood in stool and vomiting.  Genitourinary: Negative for difficulty urinating and frequency.  Musculoskeletal: Negative for neck pain.  Skin: Negative for rash.  Allergic/Immunologic: Negative for environmental allergies and food allergies.  Neurological: Negative for weakness and headaches.    Psychiatric/Behavioral: Negative for agitation.  All other systems reviewed and are negative.      Objective:   Physical Exam  Constitutional: He appears well-developed and well-nourished.  HENT:  Head: Normocephalic and atraumatic.  Right Ear: External ear normal.  Left Ear: External ear normal.  Nose: Nose normal.  Mouth/Throat: Oropharynx is clear and moist.  Eyes: Right eye exhibits no discharge. Left eye exhibits no discharge. No scleral icterus.  Neck: Normal range of motion. Neck supple. No thyromegaly present.  Cardiovascular: Normal rate, regular rhythm and normal heart sounds.  No murmur heard. Pulmonary/Chest: Effort normal and breath sounds normal. No respiratory distress. He has no wheezes.  Abdominal: Soft. Bowel sounds are normal. He exhibits no distension and no mass. There is no tenderness.  Genitourinary: Penis normal.  Musculoskeletal: Normal range of motion. He exhibits no edema.  Lymphadenopathy:    He has no cervical adenopathy.  Neurological: He is alert. He exhibits normal muscle tone. Coordination normal.  Skin: Skin is warm and dry. No erythema.  Psychiatric: He has a normal mood and affect. His behavior is normal. Judgment normal.  Vitals reviewed.         Assessment & Plan:  Impression 1 wellness exam up-to-date on colonoscopy.  Cancer followed by specialist.  2.  Depression clinically stable per patient  3.  Reflux ongoing need for meds discussed meds refilled  4.  Coronary artery disease followed by heart specialist  5.  Hyperlipidemia status uncertain check blood work medications refilled diet exercise discussed  f u 6 mo

## 2017-09-16 ENCOUNTER — Other Ambulatory Visit: Payer: Self-pay | Admitting: Family Medicine

## 2017-10-14 DIAGNOSIS — C772 Secondary and unspecified malignant neoplasm of intra-abdominal lymph nodes: Secondary | ICD-10-CM | POA: Diagnosis not present

## 2017-10-14 DIAGNOSIS — M8710 Osteonecrosis due to drugs, unspecified bone: Secondary | ICD-10-CM | POA: Diagnosis not present

## 2017-10-14 DIAGNOSIS — G894 Chronic pain syndrome: Secondary | ICD-10-CM | POA: Diagnosis not present

## 2017-10-14 DIAGNOSIS — T50905A Adverse effect of unspecified drugs, medicaments and biological substances, initial encounter: Secondary | ICD-10-CM | POA: Diagnosis not present

## 2017-10-14 DIAGNOSIS — C61 Malignant neoplasm of prostate: Secondary | ICD-10-CM | POA: Diagnosis not present

## 2017-10-14 DIAGNOSIS — M4725 Other spondylosis with radiculopathy, thoracolumbar region: Secondary | ICD-10-CM | POA: Diagnosis not present

## 2017-10-21 ENCOUNTER — Other Ambulatory Visit: Payer: Self-pay | Admitting: Family Medicine

## 2017-10-31 DIAGNOSIS — L400 Psoriasis vulgaris: Secondary | ICD-10-CM | POA: Diagnosis not present

## 2017-10-31 DIAGNOSIS — Z1283 Encounter for screening for malignant neoplasm of skin: Secondary | ICD-10-CM | POA: Diagnosis not present

## 2017-10-31 DIAGNOSIS — D225 Melanocytic nevi of trunk: Secondary | ICD-10-CM | POA: Diagnosis not present

## 2017-11-09 ENCOUNTER — Other Ambulatory Visit: Payer: Self-pay | Admitting: Family Medicine

## 2017-11-11 ENCOUNTER — Other Ambulatory Visit: Payer: Self-pay

## 2017-11-11 ENCOUNTER — Other Ambulatory Visit: Payer: Self-pay | Admitting: *Deleted

## 2017-11-11 MED ORDER — PRAVASTATIN SODIUM 80 MG PO TABS: 80.0000 mg | ORAL_TABLET | Freq: Every day | ORAL | 0 refills | Status: DC

## 2017-11-11 MED ORDER — NITROGLYCERIN 0.4 MG SL SUBL: 0.4000 mg | SUBLINGUAL_TABLET | SUBLINGUAL | 3 refills | Status: DC | PRN

## 2017-11-11 MED ORDER — PANTOPRAZOLE SODIUM 40 MG PO TBEC: 40.0000 mg | DELAYED_RELEASE_TABLET | Freq: Every day | ORAL | 5 refills | Status: DC

## 2017-11-19 DIAGNOSIS — Z79899 Other long term (current) drug therapy: Secondary | ICD-10-CM | POA: Diagnosis not present

## 2017-11-19 DIAGNOSIS — Z13220 Encounter for screening for lipoid disorders: Secondary | ICD-10-CM | POA: Diagnosis not present

## 2017-11-20 ENCOUNTER — Encounter: Payer: Self-pay | Admitting: Family Medicine

## 2017-11-20 LAB — HEPATIC FUNCTION PANEL
ALT: 11 IU/L (ref 0–44)
AST: 15 IU/L (ref 0–40)
Albumin: 4.5 g/dL (ref 3.6–4.8)
Alkaline Phosphatase: 63 IU/L (ref 39–117)
Bilirubin Total: 0.4 mg/dL (ref 0.0–1.2)
Bilirubin, Direct: 0.13 mg/dL (ref 0.00–0.40)
Total Protein: 6.4 g/dL (ref 6.0–8.5)

## 2017-11-20 LAB — LIPID PANEL
Chol/HDL Ratio: 3.2 ratio (ref 0.0–5.0)
Cholesterol, Total: 152 mg/dL (ref 100–199)
HDL: 48 mg/dL (ref >39)
LDL Calculated: 87 mg/dL (ref 0–99)
Triglycerides: 87 mg/dL (ref 0–149)
VLDL Cholesterol Cal: 17 mg/dL (ref 5–40)

## 2017-11-26 NOTE — Progress Notes (Signed)
Cardiology Office Note  Date: 11/27/2017   ID: [name] [name], DOB [birth year], MRN [id number]  PCP: Mikey Kirschner, MD  Primary Cardiologist: Rozann Lesches, MD   Chief Complaint  Patient presents with  . Coronary Artery Disease    History of Present Illness: [name] [name] is a 62 y.o. male last seen in June 2018.  He presents for a routine visit.  States that he has been doing well, rarely has used nitroglycerin over the last year.  He remains active around his house and also with yard work.  Enjoys walking for exercise.  He reports NYHA class II dyspnea and no palpitations or syncope.  I reviewed his medications.  Cardiac regimen includes aspirin, Toprol-XL, Pravachol, and as needed nitroglycerin.  Recent lab work per Dr. Wolfgang [name] showed LDL 87.  I personally reviewed his ECG today which shows sinus bradycardia.  Past Medical History:  Diagnosis Date  . Anxiety   . Asthma   . COPD (chronic obstructive pulmonary disease) ([place])   . Coronary artery disease    DES to LAD 1/12  . DDD (degenerative disc disease)   . Depression   . Drug reaction    Pamidronate - induced skin toxicity, Zometa - induced toxicity  . Essential hypertension   . Gastritis   . GERD (gastroesophageal reflux disease)   . Horseshoe kidney    Single kidney  . Internal hemorrhoids    Colonoscopy 1/12  . Myocardial infarction ([name])    2012  . Osteoarthritis   . 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 Prostate cancer ([place])    Metastatic, stage IV  - Dr. Tressie [name]  . Psoriasis   . Sigmoid diverticulosis   . Tubular adenoma    Colonoscopy 1/12    Past Surgical History:  Procedure Laterality Date  . ANTERIOR FUSION CERVICAL SPINE    . APPENDECTOMY    . ETHMOIDECTOMY Left 09/28/2013   Procedure: LEFT ENDOSCOPIC ANTERIOR ETHMOIDECTOMY;  Surgeon: [name], MD;  Location: [facility];  Service: ENT;  Laterality: Left;  . HEART STENTS  06/2010   2  . LEFT FOOT SURGERY    .  MAXILLARY ANTROSTOMY Left 09/28/2013   Procedure: LEFT ENDOSCOPIC MAXILLARY ANTROSTOMY WITH REMOVAL OF TISSUE;  Surgeon: [name], MD;  Location: [facility];  Service: ENT;  Laterality: Left;  . POSTERIOR FUSION CERVICAL SPINE    . PROSTATECTOMY  02/28/2006   Dr. Nevada [name] in [place], [place].    Current Outpatient Medications  Medication Sig Dispense Refill  . ALPRAZolam (XANAX) 0.5 MG tablet TAKE 1 TABLET BY MOUTH FOUR TIMES DAILY AS NEEDED FOR ANXIETY 120 tablet 5  . aspirin EC 81 MG EC tablet Take 1 tablet (81 mg total) by mouth daily.    . calcipotriene (DOVONOX) 0.005 % cream Apply topically 2 (two) times daily.     . calcipotriene-betamethasone (TACLONEX) ointment Apply 1 application topically daily. 60 g 3  . chlorhexidine (PERIDEX) 0.12 % solution USE 15 ML BY MOUTH TO SWISH AND SPIT TWICE DAILY 473 mL 0  . Clobetasol Propionate (TEMOVATE) 0.05 % external spray APPLY EXTERNALLY TO THE AFFECTED AREA UP TO TWICE DAILY AS NEEDED 59 mL 0  . Clobetasol Propionate 0.05 % shampoo USE SHAMPOO AS NEEDED 118 mL 11  . Elastic Bandages & Supports (LUMBAR BACK BRACE/SUPPORT PAD) MISC 1 each by Does not apply route as directed. 1 each 1  . fluticasone (FLONASE) 50 MCG/ACT  nasal spray Place 1 spray into both nostrils daily.  0  . gabapentin (NEURONTIN) 300 MG capsule TAKE 3 CAPSULES AT BEDTIME (Patient taking differently: 300 mg at bedtime. ) 90 capsule 3  . metoprolol succinate (TOPROL-XL) 25 MG 24 hr tablet TAKE 1 TABLET BY MOUTH EVERY DAY 90 tablet 1  . mupirocin ointment (BACTROBAN) 2 % Apply 1 application topically 2 (two) times daily. To rash 30 g 0  . nitroGLYCERIN (NITROSTAT) 0.4 MG SL tablet Place 1 tablet (0.4 mg total) under the tongue every 5 (five) minutes as needed for chest pain. 25 tablet 3  . oxyCODONE (OXYCONTIN) 60 MG 12 hr tablet Take 60 mg by mouth every 8 (eight) hours. 90 each 0  . Oxycodone HCl 10 MG TABS Take 1-2 tablets (10-20 mg total) by mouth every 4 (four) hours  as needed. 180 tablet 0  . pantoprazole (PROTONIX) 40 MG tablet Take 1 tablet (40 mg total) by mouth daily. 30 tablet 5  . pravastatin (PRAVACHOL) 80 MG tablet Take 1 tablet (80 mg total) by mouth daily. 90 tablet 0  . PROAIR HFA 108 (90 BASE) MCG/ACT inhaler inhale 1 puff every 4 hours WITH AEROCHAMBER 8.5 g 1   Current Facility-Administered Medications  Medication Dose Route Frequency Provider Last Rate Last Dose  . 0.9 %  sodium chloride infusion  500 mL Intravenous Continuous Milus Banister, MD       Allergies:  Other; Zoledronic acid; and Pamidronate   Social History: The patient  reports that he quit smoking about 11 years ago. His smoking use included cigarettes. He has a 15.00 pack-year smoking history. He has never used smokeless tobacco. He reports that he does not drink alcohol or use drugs.   ROS:  Please see the history of present illness. Otherwise, complete review of systems is positive for none.  All other systems are reviewed and negative.   Physical Exam: VS:  BP 130/80   Pulse (!) 54   Ht 5\' 9"  (1.753 m)   Wt 173 lb (78.5 kg)   SpO2 97%   BMI 25.55 kg/m , BMI Body mass index is 25.55 kg/m.  Wt Readings from Last 3 Encounters:  11/27/17 173 lb (78.5 kg)  08/23/17 176 lb (79.8 kg)  11/12/16 171 lb (77.6 kg)    General: Patient appears comfortable at rest. HEENT: Conjunctiva and lids normal, oropharynx clear. Neck: Supple, no elevated JVP or carotid bruits, no thyromegaly. Lungs: Clear to auscultation, nonlabored breathing at rest. Cardiac: Regular rate and rhythm, no S3 or significant systolic murmur. Abdomen: Soft, nontender, bowel sounds present. Extremities: No pitting edema, distal pulses 2+. Skin: Warm and dry. Musculoskeletal: No kyphosis. Neuropsychiatric: Alert and oriented x3, affect grossly appropriate.  ECG: I personally reviewed the tracing from 11/12/2016 which showed sinus bradycardia.  Recent Labwork: 11/19/2017: ALT 11; AST 15       Component Value Date/Time   CHOL 152 11/19/2017 1014   TRIG 87 11/19/2017 1014   HDL 48 11/19/2017 1014   CHOLHDL 3.2 11/19/2017 1014   CHOLHDL 3.2 03/17/2014 1245   VLDL 30 03/17/2014 1245   [place] 87 11/19/2017 1014    Other Studies Reviewed Today:  Echocardiogram 10/23/2012: Study Conclusions  - Left ventricle: The cavity size was normal. There was mild focal basal hypertrophy of the septum. Systolic function was normal. Wall motion was normal; there were no regional wall motion abnormalities. - Aortic valve: Mildly calcified annulus. Trileaflet; normal thickness leaflets. - Atrial septum: No defect or  patent foramen ovale was identified. - Pulmonary arteries: PA peak pressure: 65mm Hg (S). Impressions:  - Compared to the prior study of 06/28/10, there has been no significant interval change.  Assessment and Plan:  1.  CAD with history of DES to the LAD in 2012.  He reports no progressive angina symptoms or change in stamina, ECG is normal today.  Based on this would continue with observation on medical therapy.  2.  Mixed hyperlipidemia, tolerating Pravachol with recent LDL 87.  3.  Essential hypertension, blood pressure is adequately controlled today.  4.  Tobacco abuse in remission.  Current medicines were reviewed with the patient today.   Orders Placed This Encounter  Procedures  . EKG 12-Lead    Disposition: We will up in 1 year.  Signed, Satira Sark, MD, [facility] 11/27/2017 11:17 AM    [facility] at [place], [place], [place] [address] Phone: [phone]; Fax: [phone]

## 2017-11-27 ENCOUNTER — Encounter: Payer: Self-pay | Admitting: Cardiology

## 2017-11-27 ENCOUNTER — Ambulatory Visit (INDEPENDENT_AMBULATORY_CARE_PROVIDER_SITE_OTHER): Payer: PPO | Admitting: Cardiology

## 2017-11-27 VITALS — BP 130/80 | HR 54 | Ht 69.0 in | Wt 173.0 lb

## 2017-11-27 DIAGNOSIS — E782 Mixed hyperlipidemia: Secondary | ICD-10-CM

## 2017-11-27 DIAGNOSIS — F17201 Nicotine dependence, unspecified, in remission: Secondary | ICD-10-CM

## 2017-11-27 DIAGNOSIS — I10 Essential (primary) hypertension: Secondary | ICD-10-CM

## 2017-11-27 DIAGNOSIS — I25119 Atherosclerotic heart disease of native coronary artery with unspecified angina pectoris: Secondary | ICD-10-CM | POA: Diagnosis not present

## 2017-11-27 NOTE — Patient Instructions (Signed)

## 2017-12-09 DIAGNOSIS — C61 Malignant neoplasm of prostate: Secondary | ICD-10-CM | POA: Diagnosis not present

## 2018-01-26 ENCOUNTER — Other Ambulatory Visit: Payer: Self-pay | Admitting: Family Medicine

## 2018-02-10 DIAGNOSIS — C772 Secondary and unspecified malignant neoplasm of intra-abdominal lymph nodes: Secondary | ICD-10-CM | POA: Diagnosis not present

## 2018-02-10 DIAGNOSIS — G894 Chronic pain syndrome: Secondary | ICD-10-CM | POA: Diagnosis not present

## 2018-02-10 DIAGNOSIS — C61 Malignant neoplasm of prostate: Secondary | ICD-10-CM | POA: Diagnosis not present

## 2018-02-24 ENCOUNTER — Ambulatory Visit (INDEPENDENT_AMBULATORY_CARE_PROVIDER_SITE_OTHER): Payer: PPO | Admitting: Otolaryngology

## 2018-03-04 ENCOUNTER — Ambulatory Visit (INDEPENDENT_AMBULATORY_CARE_PROVIDER_SITE_OTHER): Payer: PPO | Admitting: *Deleted

## 2018-03-04 DIAGNOSIS — Z23 Encounter for immunization: Secondary | ICD-10-CM

## 2018-04-10 ENCOUNTER — Other Ambulatory Visit: Payer: Self-pay | Admitting: Family Medicine

## 2018-04-11 ENCOUNTER — Other Ambulatory Visit: Payer: Self-pay | Admitting: Family Medicine

## 2018-04-30 DIAGNOSIS — C772 Secondary and unspecified malignant neoplasm of intra-abdominal lymph nodes: Secondary | ICD-10-CM | POA: Diagnosis not present

## 2018-04-30 DIAGNOSIS — C61 Malignant neoplasm of prostate: Secondary | ICD-10-CM | POA: Diagnosis not present

## 2018-05-10 ENCOUNTER — Other Ambulatory Visit: Payer: Self-pay | Admitting: Family Medicine

## 2018-05-12 ENCOUNTER — Telehealth: Payer: Self-pay | Admitting: Cardiology

## 2018-05-12 MED ORDER — PANTOPRAZOLE SODIUM 40 MG PO TBEC: 40.0000 mg | DELAYED_RELEASE_TABLET | Freq: Every day | ORAL | 1 refills | Status: DC

## 2018-05-12 MED ORDER — METOPROLOL SUCCINATE ER 25 MG PO TB24: 25.0000 mg | ORAL_TABLET | Freq: Every day | ORAL | 1 refills | Status: DC

## 2018-05-12 MED ORDER — PRAVASTATIN SODIUM 80 MG PO TABS: 80.0000 mg | ORAL_TABLET | Freq: Every day | ORAL | 1 refills | Status: DC

## 2018-05-12 MED ORDER — NITROGLYCERIN 0.4 MG SL SUBL: 0.4000 mg | SUBLINGUAL_TABLET | SUBLINGUAL | 3 refills | Status: DC | PRN

## 2018-05-12 NOTE — Telephone Encounter (Signed)
Pt requested refills for cardiac medications to [facility] [place] - Medication sent to pharmacy.

## 2018-05-12 NOTE — Telephone Encounter (Signed)
Patient called stating that he needs to speak with someone in regards to his heart medications.

## 2018-05-13 ENCOUNTER — Telehealth: Payer: Self-pay | Admitting: Cardiology

## 2018-05-13 NOTE — Telephone Encounter (Signed)
Pt aware that aspirin as with similar medications carry as risk for bleeding and to be aware of abnormal bleeding and would still continue to take - pt voiced understanding

## 2018-05-13 NOTE — Telephone Encounter (Signed)
Patient called stating that he saw on TV that taking ASA for long periods of time can cause brain bleeds.

## 2018-06-09 ENCOUNTER — Other Ambulatory Visit: Payer: Self-pay | Admitting: Cardiology

## 2018-06-09 MED ORDER — METOPROLOL SUCCINATE ER 25 MG PO TB24: 25.0000 mg | ORAL_TABLET | Freq: Every day | ORAL | 1 refills | Status: DC

## 2018-06-09 NOTE — Telephone Encounter (Signed)
°*  STAT* If patient is at the pharmacy, call can be transferred to refill team.   1. Which medications need to be refilled? (please list name of each medication and dose if known)    metoprolol succinate (TOPROL-XL) 25    2. Which pharmacy/location (including street and city if local pharmacy) is medication to be sent to?   [place], [place], [place]   3. Do they need a 30 day or 90 day supply?  [place]

## 2018-06-09 NOTE — Telephone Encounter (Signed)
Medication sent to pharmacy  

## 2018-06-16 DIAGNOSIS — C61 Malignant neoplasm of prostate: Secondary | ICD-10-CM | POA: Diagnosis not present

## 2018-06-16 DIAGNOSIS — C772 Secondary and unspecified malignant neoplasm of intra-abdominal lymph nodes: Secondary | ICD-10-CM | POA: Diagnosis not present

## 2018-06-16 DIAGNOSIS — G894 Chronic pain syndrome: Secondary | ICD-10-CM | POA: Diagnosis not present

## 2018-06-16 DIAGNOSIS — L409 Psoriasis, unspecified: Secondary | ICD-10-CM | POA: Diagnosis not present

## 2018-06-23 ENCOUNTER — Telehealth: Payer: Self-pay | Admitting: Family Medicine

## 2018-06-23 NOTE — Telephone Encounter (Signed)
Pulled paper chart and reviewed vaccine record in epic  he has never had pneumonia vaccine here. Explained to pt he could do a nurse visit or go to pharm and he wanted to schedule a nurse visit for pneumoccocal 23. Transferred to the front to schedule nurse visit

## 2018-06-23 NOTE — Telephone Encounter (Signed)
Pt would like to know if he has had the pneumonia shot and if he is up to date or if he needs to come in and get the booster. His Oncologist was asking him to check on this.   CB# [phone]

## 2018-07-01 ENCOUNTER — Ambulatory Visit (INDEPENDENT_AMBULATORY_CARE_PROVIDER_SITE_OTHER): Payer: PPO

## 2018-07-01 DIAGNOSIS — Z23 Encounter for immunization: Secondary | ICD-10-CM

## 2018-07-16 ENCOUNTER — Other Ambulatory Visit: Payer: Self-pay

## 2018-07-16 NOTE — Patient Outreach (Signed)
[facility]) Care Management  07/16/2018  [name] [name] 1955/12/25 [id number]   Referral Date: 07/16/2018 Referral Source: [facility] Concierge Referral Reason: needs assistance with transportation to dental clinic for procedure   Outreach Attempt: No answer.  HIPAA compliant voice message left.   Plan: RN CM will attempt again within 4 business day and send letter.   [name], RN, MSN [facility] Care Management Care Management Coordinator Direct Line [phone] Toll Free: [phone]  Fax: [phone]

## 2018-07-17 ENCOUNTER — Other Ambulatory Visit: Payer: Self-pay

## 2018-07-17 NOTE — Patient Outreach (Addendum)
[facility]) Care Management  07/17/2018  [name] [name] 1956/02/12 [id number]   Referral Date: 07/16/2018 Referral Source: [facility] Concierge Referral Reason: needs assistance with transportation to dental clinic for procedure  Outreach Attempt: spoke with patient. He states that he has osteonecrosis and has several teeth that are rotten and Dr. Drue Stager has advised patient to try [facility] for his extractions as he cannot afford to pay for oral surgery.  Dr. Drue Stager requests that patient have this done by his next appointment in April.  Patient reports that his need now is try to get some type of transportation to [facility].  Patient reports he cannot afford to pay for transportation [facility] and has tried and been trying different avenues to get transportation.  Patient reports that he has been to social services and other agencies to seek out transportation but to no avail.  Patient reports that it is hard to eat due to teeth being rotten.    Patient has history of prostate cancer with metastasis.  He states that the medication they had him on caused the osteonecrosis.  He also suffers anxiety, depression, COPD, and Psoriasis.  Patient lives with his children, but are of no assistance as they are in school.  Patient is independent with care but does not  have a car and does not drive.    Discussed [facility] services and support with patient and he is agreeable to try social work to help with any possible assistance with transportation.   Plan: RN CM refer to social work and sign off case.     [name], RN, MSN [facility] Care Management Care Management Coordinator Direct Line [phone] Toll Free: [phone]  Fax: [phone]

## 2018-07-18 ENCOUNTER — Other Ambulatory Visit: Payer: Self-pay

## 2018-07-18 ENCOUNTER — Ambulatory Visit: Payer: PPO

## 2018-07-18 NOTE — Patient Outreach (Signed)
[facility]) Care Management  07/18/2018  [name] [name] 11-27-1955 [id number]   Successful outreach to patient regarding social work referral for transportation assistance to [facility].  See 07/17/18 note from [facility], Jon Billings for more details.  BSW messaged [facility] regarding cost for this transportation request.  BSW will staff with [facility] leadership and follow up with patient.   [name], BSW Social Worker [phone]

## 2018-07-22 ENCOUNTER — Other Ambulatory Visit: Payer: Self-pay

## 2018-07-22 NOTE — Patient Outreach (Signed)
[facility]) Care Management  07/22/2018  [name] [name] 1955/10/05 [id number]  BSW staffed case with Assistant Clinical Director of Care Management, [place] received approval to provide transportation to [facility] due to patient having no other transportation options.  BSW contacted [name] [name] to inform him of transportation being approved.  It was recommended by Mrs. Pulliam that patient be accompanied by a family member to this procedure. [name] [name] lives with both of his children but neither of them have a functioning vehicle at this time. He did report that one of them should be able to accompany him to the appointment, however.  [name] [name] will call BSW back when appointment is confirmed.    [name], BSW Social Worker [phone]

## 2018-07-23 ENCOUNTER — Other Ambulatory Visit: Payer: Self-pay | Admitting: Family Medicine

## 2018-08-04 ENCOUNTER — Ambulatory Visit: Payer: Self-pay

## 2018-08-05 ENCOUNTER — Other Ambulatory Visit: Payer: Self-pay

## 2018-08-05 NOTE — Patient Outreach (Signed)
[facility]) Care Management  08/05/2018  [name] [name] 07-02-1955 [id number]   BSW attempted to contact patient to determine if he has scheduled appointment at [facility].  Unable to get through on home number listed.  Left message on mobile number.  Will attempt to reach again within four business days.  [name], BSW Social Worker [phone]

## 2018-08-07 ENCOUNTER — Other Ambulatory Visit: Payer: Self-pay

## 2018-08-07 NOTE — Patient Outreach (Signed)
[facility]) Care Management  08/07/2018  [name] [name] [birth year] [id number]   Successful outreach to patient to determine if appointment has been scheduled at [facility].  Patient reported that a referral was required for appointment which has been communicated to his MD.  He is awaiting a call from the clinic regarding appointment date.  Patient agreed to call BSW when appointment is scheduled so that transportation can be arranged.   [name], BSW Social Worker [phone]

## 2018-09-08 DIAGNOSIS — L409 Psoriasis, unspecified: Secondary | ICD-10-CM | POA: Diagnosis not present

## 2018-09-08 DIAGNOSIS — G894 Chronic pain syndrome: Secondary | ICD-10-CM | POA: Diagnosis not present

## 2018-09-08 DIAGNOSIS — Z8546 Personal history of malignant neoplasm of prostate: Secondary | ICD-10-CM | POA: Diagnosis not present

## 2018-09-08 DIAGNOSIS — M8710 Osteonecrosis due to drugs, unspecified bone: Secondary | ICD-10-CM | POA: Diagnosis not present

## 2018-09-08 DIAGNOSIS — C61 Malignant neoplasm of prostate: Secondary | ICD-10-CM | POA: Diagnosis not present

## 2018-09-08 DIAGNOSIS — Z85858 Personal history of malignant neoplasm of other endocrine glands: Secondary | ICD-10-CM | POA: Diagnosis not present

## 2018-09-08 DIAGNOSIS — Z9189 Other specified personal risk factors, not elsewhere classified: Secondary | ICD-10-CM | POA: Insufficient documentation

## 2018-09-08 DIAGNOSIS — C772 Secondary and unspecified malignant neoplasm of intra-abdominal lymph nodes: Secondary | ICD-10-CM | POA: Diagnosis not present

## 2018-09-08 DIAGNOSIS — Z08 Encounter for follow-up examination after completed treatment for malignant neoplasm: Secondary | ICD-10-CM | POA: Diagnosis not present

## 2018-09-08 DIAGNOSIS — Z91849 Unspecified risk for dental caries: Secondary | ICD-10-CM | POA: Diagnosis not present

## 2018-09-08 DIAGNOSIS — Z79818 Long term (current) use of other agents affecting estrogen receptors and estrogen levels: Secondary | ICD-10-CM | POA: Diagnosis not present

## 2018-10-09 ENCOUNTER — Other Ambulatory Visit: Payer: Self-pay

## 2018-10-09 NOTE — Patient Outreach (Signed)
[facility]) Care Management  10/09/2018  KIETH HARTIS Nov 09, 1955 [id number]   Patient referred to [place] work in February due to need for transportation to [facility] for dental procedure.  Transportation request was approved by Scientist, research (life sciences) of Care Management, Bary Castilla.  BSW last communicated with patient on 08/07/18.  At that time he was awaiting a return call from the clinic regarding the appointment and agreed to contact BSW once scheduled.  At this time, BSW has not received call from patient.  Attempted to reach him today but was unsuccessful.  BSW is closing case but can reopen if contacted by patient.  [name], BSW Social Worker [phone]

## 2018-11-03 DIAGNOSIS — M8710 Osteonecrosis due to drugs, unspecified bone: Secondary | ICD-10-CM | POA: Diagnosis not present

## 2018-11-03 DIAGNOSIS — Z79818 Long term (current) use of other agents affecting estrogen receptors and estrogen levels: Secondary | ICD-10-CM | POA: Diagnosis not present

## 2018-11-03 DIAGNOSIS — G894 Chronic pain syndrome: Secondary | ICD-10-CM | POA: Diagnosis not present

## 2018-11-03 DIAGNOSIS — R350 Frequency of micturition: Secondary | ICD-10-CM | POA: Diagnosis not present

## 2018-11-03 DIAGNOSIS — M5440 Lumbago with sciatica, unspecified side: Secondary | ICD-10-CM | POA: Diagnosis not present

## 2018-11-03 DIAGNOSIS — L409 Psoriasis, unspecified: Secondary | ICD-10-CM | POA: Diagnosis not present

## 2018-11-03 DIAGNOSIS — Z9189 Other specified personal risk factors, not elsewhere classified: Secondary | ICD-10-CM | POA: Diagnosis not present

## 2018-11-03 DIAGNOSIS — C772 Secondary and unspecified malignant neoplasm of intra-abdominal lymph nodes: Secondary | ICD-10-CM | POA: Diagnosis not present

## 2018-11-03 DIAGNOSIS — C61 Malignant neoplasm of prostate: Secondary | ICD-10-CM | POA: Diagnosis not present

## 2018-11-06 ENCOUNTER — Other Ambulatory Visit: Payer: Self-pay | Admitting: Family Medicine

## 2018-11-07 NOTE — Telephone Encounter (Signed)
Please call and schedule and let nurses know once appt is made so we can send in med. thanks

## 2018-11-07 NOTE — Telephone Encounter (Signed)
Appt scheduled for 11/10/2018 Pt aware

## 2018-11-07 NOTE — Telephone Encounter (Signed)
sched o v next week, may then ref times one

## 2018-11-10 ENCOUNTER — Ambulatory Visit (INDEPENDENT_AMBULATORY_CARE_PROVIDER_SITE_OTHER): Payer: PPO | Admitting: Family Medicine

## 2018-11-10 ENCOUNTER — Encounter: Payer: Self-pay | Admitting: Family Medicine

## 2018-11-10 ENCOUNTER — Other Ambulatory Visit: Payer: Self-pay

## 2018-11-10 VITALS — BP 110/72 | Temp 97.3°F | Ht 69.0 in | Wt 198.0 lb

## 2018-11-10 DIAGNOSIS — M79652 Pain in left thigh: Secondary | ICD-10-CM

## 2018-11-10 DIAGNOSIS — K219 Gastro-esophageal reflux disease without esophagitis: Secondary | ICD-10-CM

## 2018-11-10 DIAGNOSIS — E782 Mixed hyperlipidemia: Secondary | ICD-10-CM

## 2018-11-10 DIAGNOSIS — S76212A Strain of adductor muscle, fascia and tendon of left thigh, initial encounter: Secondary | ICD-10-CM

## 2018-11-10 NOTE — Progress Notes (Signed)
   Subjective:    Patient ID: [name] [name], male    DOB: Apr 15, 1956, 63 y.o.   MRN: [id number] Patient arrives with several concerns Hyperlipidemia  This is a chronic problem. The current episode started more than 1 year ago. Treatments tried: pravastatin 80mg .  pt stopped taking metoprolol prescribed by cardiologist due to side effects. Caused hands to feel cool, and felt like headache from it too Patient has known coronary artery disease.  Unfortunately stopped the metoprolol because of perceived side effects   Patient continues to take lipid medication regularly. No obvious side effects from it. Generally does not miss a dose. Prior blood work results are reviewed with patient. Patient continues to work on fat intake in diet    Having left side pain for about one month. Pain when twisting or moving.   Primarily in the left groin region.  Feels no actual bulging.  Did a lot of heavy work with gravel.  Recalls no sudden injury.  Aching painful at times sharp at times worse with certain motions   Review of Systems No headache, no major weight loss or weight gain, no chest pain no back pain abdominal pain no change in bowel habits complete ROS otherwise negative     Objective:   Physical Exam  Alert and oriented, vitals reviewed and stable, NAD ENT-TM's and ext canals WNL bilat via otoscopic exam Soft palate, tonsils and post pharynx WNL via oropharyngeal exam Neck-symmetric, no masses; thyroid nonpalpable and nontender Pulmonary-no tachypnea or accessory muscle use; Clear without wheezes via auscultation Card--no abnrml murmurs, rhythm reg and rate WNL Carotid pulses symmetric, without bruits Testicular exam normal left groin tender to palpation no masses      Assessment & Plan:  Impression 1 inguinal strain discussed anti-inflammatory medicine PRN local measures discussed expect slow resolution  2.  Hyperlipidemia prior blood work reviewed patient maintain same lipid  profile ordered  3.  Coronary artery disease with noncompliance.  Importance of maintaining meds discussed.  Encouraged to maintain metoprolol so he can talk with his cardiologist  Appropriate blood work medications refilled diet exercise discussed follow-up in 6 months

## 2019-01-26 NOTE — Progress Notes (Signed)
Cardiology Office Note  Date: 01/27/2019   ID: Shaft, Tia 1956-03-12, MRN [id number]  PCP:  Mikey Kirschner, MD  Cardiologist:  Rozann Lesches, MD Electrophysiologist:  None   Chief Complaint  Patient presents with   Cardiac follow-up    History of Present Illness: [name] [name] is a 63 y.o. male last seen in June 2019.  He presents for a routine visit.  He does not report any significant angina symptoms or nitroglycerin use.  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  We went over his medications today.  He states that he stopped Toprol-XL during the winter, indicated that his feet and hands were cold all the time, this improved off the medication.  He is bradycardic at baseline.  I personally reviewed his ECG today which shows sinus bradycardia.  He remains on statin therapy, last LDL was 87.  Still following with [name].  Past Medical History:  Diagnosis Date   Anxiety    Asthma    COPD (chronic obstructive pulmonary disease) (HCC)    Coronary artery disease    DES to LAD 1/12   DDD (degenerative disc disease)    Depression    Drug reaction    Pamidronate - induced skin toxicity, Zometa - induced toxicity   Essential hypertension    Gastritis    GERD (gastroesophageal reflux disease)    Horseshoe kidney    Single kidney   Internal hemorrhoids    Colonoscopy 1/12   Myocardial infarction [facility])    2012   Osteoarthritis    Osteonecrosis of jaw due to drug [facility])    Prostate cancer ([place])    Metastatic, stage IV  - [name]   Psoriasis    Sigmoid diverticulosis    Tubular adenoma    Colonoscopy 1/12    Past Surgical History:  Procedure Laterality Date   ANTERIOR FUSION CERVICAL SPINE     APPENDECTOMY     ETHMOIDECTOMY Left 09/28/2013   Procedure: LEFT ENDOSCOPIC ANTERIOR ETHMOIDECTOMY;  Surgeon: [name], MD;  Location: [facility];   Service: ENT;  Laterality: Left;   HEART STENTS  06/2010   2   LEFT FOOT SURGERY     MAXILLARY ANTROSTOMY Left 09/28/2013   Procedure: LEFT ENDOSCOPIC MAXILLARY ANTROSTOMY WITH REMOVAL OF TISSUE;  Surgeon: [name], MD;  Location: [facility];  Service: ENT;  Laterality: Left;   POSTERIOR FUSION CERVICAL SPINE     PROSTATECTOMY  02/28/2006   [name] in [place], [place].    Current Outpatient Medications  Medication Sig Dispense Refill   ALPRAZolam (XANAX) 0.5 MG tablet TAKE 1 TABLET BY MOUTH FOUR TIMES DAILY AS NEEDED FOR ANXIETY 120 tablet 5   aspirin EC 81 MG EC tablet Take 1 tablet (81 mg total) by mouth daily.     calcipotriene (DOVONOX) 0.005 % cream Apply topically 2 (two) times daily.      calcipotriene-betamethasone (TACLONEX) ointment Apply 1 application topically daily. 60 g 3   chlorhexidine (PERIDEX) 0.12 % solution USE 15 ML BY MOUTH TO SWISH AND SPIT TWICE DAILY 473 mL 0   Clobetasol Propionate (TEMOVATE) 0.05 % external spray APPLY EXTERNALLY TO THE AFFECTED AREA UP TO TWICE DAILY AS NEEDED 59 mL 0   Clobetasol Propionate 0.05 % shampoo USE SHAMPOO AS NEEDED 118 mL 11   Elastic Bandages & Supports (LUMBAR BACK BRACE/SUPPORT PAD) MISC  1 each by Does not apply route as directed. 1 each 1   fluticasone (FLONASE) 50 MCG/ACT nasal spray Place 1 spray into both nostrils daily.  0   gabapentin (NEURONTIN) 300 MG capsule Take 300 mg by mouth at bedtime.     mupirocin ointment (BACTROBAN) 2 % Apply 1 application topically 2 (two) times daily. To rash 30 g 0   nitroGLYCERIN (NITROSTAT) 0.4 MG SL tablet Place 1 tablet (0.4 mg total) under the tongue every 5 (five) minutes as needed for chest pain. 25 tablet 3   oxyCODONE (OXYCONTIN) 60 MG 12 hr tablet Take 60 mg by mouth every 8 (eight) hours. 90 each 0   Oxycodone HCl 10 MG TABS Take 1-2 tablets (10-20 mg total) by mouth every 4 (four) hours as needed. 180 tablet 0   pantoprazole (PROTONIX) 40 MG tablet  Take 1 tablet (40 mg total) by mouth daily. 90 tablet 1   pravastatin (PRAVACHOL) 80 MG tablet Take 1 tablet (80 mg total) by mouth daily. 90 tablet 0   PROAIR HFA 108 (90 BASE) MCG/ACT inhaler inhale 1 puff every 4 hours WITH AEROCHAMBER 8.5 g 1   Current Facility-Administered Medications  Medication Dose Route Frequency Provider Last Rate Last Dose   0.9 %  sodium chloride infusion  500 mL Intravenous Continuous Milus Banister, MD       Allergies:  Other, Zoledronic acid, and Pamidronate   Social History: The patient  reports that he quit smoking about 12 years ago. His smoking use included cigarettes. He has a 15.00 pack-year smoking history. He has never used smokeless tobacco. He reports that he does not drink alcohol or use drugs.   ROS:  Please see the history of present illness. Otherwise, complete review of systems is positive for none.  All other systems are reviewed and negative.   Physical Exam: VS:  BP 123/76    Pulse (!) 59    Temp 99.2 F (37.3 C)    Ht 5\' 9"  (1.753 m)    Wt 195 lb (88.5 kg)    SpO2 96%    BMI 28.80 kg/m , BMI Body mass index is 28.8 kg/m.  Wt Readings from Last 3 Encounters:  01/27/19 195 lb (88.5 kg)  11/10/18 198 lb (89.8 kg)  11/27/17 173 lb (78.5 kg)    General: Patient appears comfortable at rest. HEENT: Conjunctiva and lids normal, wearing a mask. Neck: Supple, no elevated JVP or carotid bruits, no thyromegaly. Lungs: Clear to auscultation, nonlabored breathing at rest. Cardiac: Regular rate and rhythm, no S3 or significant systolic murmur. Abdomen: Soft, nontender, bowel sounds present. Extremities: No pitting edema, distal pulses 2+. Skin: Warm and dry. Musculoskeletal: No kyphosis. Neuropsychiatric: Alert and oriented x3, affect grossly appropriate.  ECG:  An ECG dated 11/27/2017 was personally reviewed today and demonstrated:  Sinus bradycardia.  Recent Labwork:    Component Value Date/Time   CHOL 152 11/19/2017 1014   TRIG 87  11/19/2017 1014   HDL 48 11/19/2017 1014   CHOLHDL 3.2 11/19/2017 1014   CHOLHDL 3.2 03/17/2014 1245   VLDL 30 03/17/2014 1245   LDLCALC 87 11/19/2017 1014    Other Studies Reviewed Today:  Echocardiogram 10/23/2012: Study Conclusions  - Left ventricle: The cavity size was normal. There was mild focal basal hypertrophy of the septum. Systolic function was normal. Wall motion was normal; there were no regional wall motion abnormalities. - Aortic valve: Mildly calcified annulus. Trileaflet; normal thickness leaflets. - Atrial septum: No defect  or patent foramen ovale was identified. - Pulmonary arteries: PA peak pressure: 77mm Hg (S). Impressions:  - Compared to the prior study of 06/28/10, there has been no significant interval change.  Assessment and Plan:  1.  CAD status post DES to the LAD in 2012.  We continue with observation on medical therapy in the absence of active angina symptoms.  ECG reviewed.  Agree with stopping beta-blocker given prior symptoms, he feels better now and has relatively low heart rate at baseline anyway.  2.  Mixed hyperlipidemia.  He continues on Pravachol with LDL 87.  Keep follow-up with [name].  3.  Essential hypertension, systolics in the [id number] today.  No changes were made.  4.  Tobacco abuse in remission.  Medication Adjustments/Labs and Tests Ordered: Current medicines are reviewed at length with the patient today.  Concerns regarding medicines are outlined above.   Tests Ordered: No orders of the defined types were placed in this encounter.   Medication Changes: No orders of the defined types were placed in this encounter.   Disposition:  Follow up 1 year in the [place] office.  Signed, Satira Sark, MD, [facility] 01/27/2019 2:14 PM    [facility] [place], [place], [place] [address] Phone: [phone]; Fax: [phone]

## 2019-01-27 ENCOUNTER — Encounter: Payer: Self-pay | Admitting: Cardiology

## 2019-01-27 ENCOUNTER — Other Ambulatory Visit: Payer: Self-pay

## 2019-01-27 ENCOUNTER — Ambulatory Visit: Payer: PPO | Admitting: Cardiology

## 2019-01-27 VITALS — BP 123/76 | HR 59 | Temp 99.2°F | Ht 69.0 in | Wt 195.0 lb

## 2019-01-27 DIAGNOSIS — E782 Mixed hyperlipidemia: Secondary | ICD-10-CM | POA: Diagnosis not present

## 2019-01-27 DIAGNOSIS — F17201 Nicotine dependence, unspecified, in remission: Secondary | ICD-10-CM

## 2019-01-27 DIAGNOSIS — I10 Essential (primary) hypertension: Secondary | ICD-10-CM

## 2019-01-27 DIAGNOSIS — I25119 Atherosclerotic heart disease of native coronary artery with unspecified angina pectoris: Secondary | ICD-10-CM | POA: Diagnosis not present

## 2019-01-27 DIAGNOSIS — L400 Psoriasis vulgaris: Secondary | ICD-10-CM | POA: Diagnosis not present

## 2019-01-27 DIAGNOSIS — L981 Factitial dermatitis: Secondary | ICD-10-CM | POA: Diagnosis not present

## 2019-01-27 NOTE — Addendum Note (Signed)
Addended by: Laurine Blazer on: 01/27/2019 02:28 PM   Modules accepted: Orders

## 2019-01-27 NOTE — Patient Instructions (Addendum)

## 2019-02-03 ENCOUNTER — Other Ambulatory Visit: Payer: Self-pay | Admitting: Family Medicine

## 2019-02-16 DIAGNOSIS — C772 Secondary and unspecified malignant neoplasm of intra-abdominal lymph nodes: Secondary | ICD-10-CM | POA: Diagnosis not present

## 2019-02-16 DIAGNOSIS — R350 Frequency of micturition: Secondary | ICD-10-CM | POA: Diagnosis not present

## 2019-02-16 DIAGNOSIS — G894 Chronic pain syndrome: Secondary | ICD-10-CM | POA: Diagnosis not present

## 2019-02-16 DIAGNOSIS — C61 Malignant neoplasm of prostate: Secondary | ICD-10-CM | POA: Diagnosis not present

## 2019-02-16 DIAGNOSIS — Z9189 Other specified personal risk factors, not elsewhere classified: Secondary | ICD-10-CM | POA: Diagnosis not present

## 2019-02-18 ENCOUNTER — Other Ambulatory Visit: Payer: Self-pay | Admitting: Cardiology

## 2019-02-18 MED ORDER — PANTOPRAZOLE SODIUM 40 MG PO TBEC: 40.0000 mg | DELAYED_RELEASE_TABLET | Freq: Every day | ORAL | 1 refills | Status: DC

## 2019-02-18 NOTE — Telephone Encounter (Signed)
Medication sent to pharmacy  

## 2019-02-18 NOTE — Telephone Encounter (Signed)
° ° °  1. Which medications need to be refilled? (please list name of each medication and dose if known)  Protonix 40 mg      2. Which pharmacy/location (including street and city if local pharmacy) is medication to be sent to? [facility] [place], [place]   3. Do they need a 30 day or 90 day supply?   Patient states that he is out of medication.

## 2019-02-27 ENCOUNTER — Other Ambulatory Visit: Payer: Self-pay

## 2019-02-28 ENCOUNTER — Other Ambulatory Visit (INDEPENDENT_AMBULATORY_CARE_PROVIDER_SITE_OTHER): Payer: PPO

## 2019-02-28 DIAGNOSIS — Z23 Encounter for immunization: Secondary | ICD-10-CM | POA: Diagnosis not present

## 2019-03-28 DIAGNOSIS — S299XXA Unspecified injury of thorax, initial encounter: Secondary | ICD-10-CM | POA: Diagnosis not present

## 2019-03-28 DIAGNOSIS — S0990XA Unspecified injury of head, initial encounter: Secondary | ICD-10-CM | POA: Diagnosis not present

## 2019-03-28 DIAGNOSIS — S0083XA Contusion of other part of head, initial encounter: Secondary | ICD-10-CM | POA: Diagnosis not present

## 2019-03-28 DIAGNOSIS — T148XXA Other injury of unspecified body region, initial encounter: Secondary | ICD-10-CM | POA: Diagnosis not present

## 2019-03-28 DIAGNOSIS — Z79899 Other long term (current) drug therapy: Secondary | ICD-10-CM | POA: Diagnosis not present

## 2019-03-28 DIAGNOSIS — S60221A Contusion of right hand, initial encounter: Secondary | ICD-10-CM | POA: Diagnosis not present

## 2019-03-28 DIAGNOSIS — S0993XA Unspecified injury of face, initial encounter: Secondary | ICD-10-CM | POA: Diagnosis not present

## 2019-03-28 DIAGNOSIS — T07XXXA Unspecified multiple injuries, initial encounter: Secondary | ICD-10-CM | POA: Diagnosis not present

## 2019-03-28 DIAGNOSIS — Z8546 Personal history of malignant neoplasm of prostate: Secondary | ICD-10-CM | POA: Diagnosis not present

## 2019-03-28 DIAGNOSIS — S199XXA Unspecified injury of neck, initial encounter: Secondary | ICD-10-CM | POA: Diagnosis not present

## 2019-03-28 DIAGNOSIS — E7800 Pure hypercholesterolemia, unspecified: Secondary | ICD-10-CM | POA: Diagnosis not present

## 2019-03-28 DIAGNOSIS — I10 Essential (primary) hypertension: Secondary | ICD-10-CM | POA: Diagnosis not present

## 2019-03-28 DIAGNOSIS — S5012XA Contusion of left forearm, initial encounter: Secondary | ICD-10-CM | POA: Diagnosis not present

## 2019-03-28 DIAGNOSIS — R609 Edema, unspecified: Secondary | ICD-10-CM | POA: Diagnosis not present

## 2019-03-28 DIAGNOSIS — Z23 Encounter for immunization: Secondary | ICD-10-CM | POA: Diagnosis not present

## 2019-03-28 DIAGNOSIS — S5011XA Contusion of right forearm, initial encounter: Secondary | ICD-10-CM | POA: Diagnosis not present

## 2019-04-28 DIAGNOSIS — J441 Chronic obstructive pulmonary disease with (acute) exacerbation: Secondary | ICD-10-CM | POA: Diagnosis not present

## 2019-04-28 DIAGNOSIS — C61 Malignant neoplasm of prostate: Secondary | ICD-10-CM | POA: Diagnosis not present

## 2019-04-28 DIAGNOSIS — C772 Secondary and unspecified malignant neoplasm of intra-abdominal lymph nodes: Secondary | ICD-10-CM | POA: Diagnosis not present

## 2019-04-28 DIAGNOSIS — G894 Chronic pain syndrome: Secondary | ICD-10-CM | POA: Diagnosis not present

## 2019-05-09 ENCOUNTER — Other Ambulatory Visit: Payer: Self-pay | Admitting: Family Medicine

## 2019-05-12 ENCOUNTER — Ambulatory Visit (INDEPENDENT_AMBULATORY_CARE_PROVIDER_SITE_OTHER): Payer: PPO | Admitting: Family Medicine

## 2019-05-12 ENCOUNTER — Other Ambulatory Visit: Payer: Self-pay

## 2019-05-12 DIAGNOSIS — Z125 Encounter for screening for malignant neoplasm of prostate: Secondary | ICD-10-CM | POA: Diagnosis not present

## 2019-05-12 DIAGNOSIS — Z79899 Other long term (current) drug therapy: Secondary | ICD-10-CM

## 2019-05-12 DIAGNOSIS — Z0000 Encounter for general adult medical examination without abnormal findings: Secondary | ICD-10-CM

## 2019-05-12 DIAGNOSIS — Z13220 Encounter for screening for lipoid disorders: Secondary | ICD-10-CM

## 2019-05-12 MED ORDER — TAMSULOSIN HCL 0.4 MG PO CAPS: 0.4000 mg | ORAL_CAPSULE | Freq: Every day | ORAL | 5 refills | Status: DC

## 2019-05-12 NOTE — Progress Notes (Signed)
   Subjective:    Patient ID: [name] [name], male    DOB: Jul 27, 1955, 63 y.o.   MRN: [id number]  HPI  The patient comes in today for a wellness visit.    A review of their health history was completed.  A review of medications was also completed.  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   Additional concerns: bactrim ds given by cancer dr for bronchitis made him sick so he had to stop it and wanted to know if he needed anything else.  eercising reg  Splits wood busts wood stays active  Flu shot    Review of Systems  Constitutional: Negative for activity change, appetite change and fever.  HENT: Negative for congestion and rhinorrhea.   Eyes: Negative for discharge.  Respiratory: Negative for cough and wheezing.   Cardiovascular: Negative for chest pain.  Gastrointestinal: Negative for abdominal pain, blood in stool and vomiting.  Genitourinary: Negative for difficulty urinating and frequency.  Musculoskeletal: Negative for neck pain.  Skin: Negative for rash.  Allergic/Immunologic: Negative for environmental allergies and food allergies.  Neurological: Negative for weakness and headaches.  Psychiatric/Behavioral: Negative for agitation.  All other systems reviewed and are negative.      Objective:   Physical Exam Vitals reviewed.  Constitutional:      Appearance: He is well-developed.  HENT:     Head: Normocephalic and atraumatic.     Right Ear: External ear normal.     Left Ear: External ear normal.     Nose: Nose normal.  Eyes:     Pupils: Pupils are equal, round, and reactive to light.  Neck:     Thyroid: No thyromegaly.  Cardiovascular:     Rate and Rhythm: Normal rate and regular rhythm.     Heart sounds: Normal heart sounds. No murmur.   Pulmonary:     Effort: Pulmonary effort is normal. No respiratory distress.     Breath sounds: Normal breath sounds. No wheezing.  Abdominal:     General: Bowel sounds are normal. There is no distension.     Palpations: Abdomen is soft. There is no mass.     Tenderness: There is no abdominal tenderness.  Genitourinary:    Penis: Normal.   Musculoskeletal:        General: Normal range of motion.     Cervical back: Normal range of motion and neck supple.  Lymphadenopathy:     Cervical: No cervical adenopathy.  Skin:    General: Skin is warm and dry.     Findings: No erythema.  Neurological:     Mental Status: He is alert.     Motor: No abnormal muscle tone.  Psychiatric:        Behavior: Behavior normal.        Judgment: Judgment normal.           Assessment & Plan:  Impression wellness exam history of metastatic prostate cancer.  Followed by Dr. Abran Duke.  Diet discussed.  Exercise discussed.  Vaccines discussed.  Blood work discussed and ordered colonoscopy due next ummer 2016

## 2019-05-13 LAB — PSA: Prostate Specific Ag, Serum: <0.1 ng/mL (ref 0.0–4.0)

## 2019-05-13 LAB — LIPID PANEL
Chol/HDL Ratio: 5.5 ratio — ABNORMAL HIGH (ref 0.0–5.0)
Cholesterol, Total: 144 mg/dL (ref 100–199)
HDL: 26 mg/dL — ABNORMAL LOW (ref >39)
LDL Chol Calc (NIH): 97 mg/dL (ref 0–99)
Triglycerides: 116 mg/dL (ref 0–149)
VLDL Cholesterol Cal: 21 mg/dL (ref 5–40)

## 2019-05-13 LAB — BASIC METABOLIC PANEL
BUN/Creatinine Ratio: 8 — ABNORMAL LOW (ref 10–24)
BUN: 7 mg/dL — ABNORMAL LOW (ref 8–27)
CO2: 24 mmol/L (ref 20–29)
Calcium: 8.9 mg/dL (ref 8.6–10.2)
Chloride: 100 mmol/L (ref 96–106)
Creatinine, Ser: 0.91 mg/dL (ref 0.76–1.27)
GFR calc Af Amer: 102 mL/min/{1.73_m2} (ref >59)
GFR calc non Af Amer: 88 mL/min/{1.73_m2} (ref >59)
Glucose: 89 mg/dL (ref 65–99)
Potassium: 4.5 mmol/L (ref 3.5–5.2)
Sodium: 140 mmol/L (ref 134–144)

## 2019-05-13 LAB — HEPATIC FUNCTION PANEL
ALT: 16 IU/L (ref 0–44)
AST: 22 IU/L (ref 0–40)
Albumin: 4 g/dL (ref 3.8–4.8)
Alkaline Phosphatase: 74 IU/L (ref 39–117)
Bilirubin Total: 0.3 mg/dL (ref 0.0–1.2)
Bilirubin, Direct: 0.12 mg/dL (ref 0.00–0.40)
Total Protein: 6.8 g/dL (ref 6.0–8.5)

## 2019-05-17 ENCOUNTER — Encounter: Payer: Self-pay | Admitting: Family Medicine

## 2019-05-18 ENCOUNTER — Encounter: Payer: Self-pay | Admitting: Family Medicine

## 2019-07-07 ENCOUNTER — Ambulatory Visit (HOSPITAL_COMMUNITY)
Admission: RE | Admit: 2019-07-07 | Discharge: 2019-07-07 | Disposition: A | Discharge status: Home / Self Care | Payer: PPO | Source: Ambulatory Visit | Attending: Family Medicine | Admitting: Family Medicine

## 2019-07-07 ENCOUNTER — Other Ambulatory Visit: Payer: Self-pay

## 2019-07-07 ENCOUNTER — Ambulatory Visit (INDEPENDENT_AMBULATORY_CARE_PROVIDER_SITE_OTHER): Payer: PPO | Admitting: Family Medicine

## 2019-07-07 VITALS — BP 128/82 | Temp 97.8°F | Wt 183.6 lb

## 2019-07-07 DIAGNOSIS — R079 Chest pain, unspecified: Secondary | ICD-10-CM | POA: Insufficient documentation

## 2019-07-07 DIAGNOSIS — M940 Chondrocostal junction syndrome [Tietze]: Secondary | ICD-10-CM | POA: Diagnosis not present

## 2019-07-07 NOTE — Progress Notes (Signed)
   Subjective:    Patient ID: [name] [name], male    DOB: 03/11/1956, 64 y.o.   MRN: [id number]  HPI  Patient arrives to discuss right side pain for a few weeks .  Patient was going to discuss with his oncologist but his appt got rescheduled.  Pain is sharp in nature.  Worse with a deep breath.  Worse with pressure on that side.  Lateral chest wall.  No association with meals.  Patient is focused on his gallbladder as a source.  No postprandial component  Review of Systems No headache no shortness of breath no exertional component no change in bowel habits no fever no nausea no vomiting    Objective:   Physical Exam  Alert active hydration good HEENT normal lungs clear.  Heart regular rhythm right lateral chest wall tender to deep palpation abdominal exam benign      Assessment & Plan:  Impression chest wall pain.  Discussed.  Chest x-ray to look for other potential etiologies.  Anti-inflammatory medicine recommended.  Patient very focused on his gallbladder I advised him that his pain was not at all this with gallbladder so do not advise work-up at this time.

## 2019-07-09 ENCOUNTER — Encounter: Payer: Self-pay | Admitting: Family Medicine

## 2019-07-09 ENCOUNTER — Telehealth: Payer: Self-pay | Admitting: Family Medicine

## 2019-07-09 DIAGNOSIS — R109 Unspecified abdominal pain: Secondary | ICD-10-CM

## 2019-07-09 NOTE — Telephone Encounter (Signed)
Patient pain as described at the visit was lateral right chest wall, sharp in nature, it was tender to palpation to the lateral chest wall, and definitely worse with a deep breath. None of this goes with gallstones, but patient was adamant that it was gallstones. Now, he may have gallstones but it will not cause features as noted above. Go ahead and order a gallbladder ultrasound. Let him know the antiinflam med may be contributing to his nausea

## 2019-07-09 NOTE — Telephone Encounter (Signed)
Pt still having bad right side pain & nausea (not currently vomitting) - was seen here 07/07/2019 for this  Please advise & call pt    [facility]

## 2019-07-09 NOTE — Telephone Encounter (Signed)
Called and discussed with pt. Pt wanted to do [place] on Tuesday when he would have a ride. Scheduled at [facility] 3:30 on Tuesday feb 9th. Arrive at 3:15 NPO 6 hours prior to test. pt verbalized understanding.

## 2019-07-09 NOTE — Telephone Encounter (Signed)
[name] [name] wanted to let you know I already scheduled this [place]

## 2019-07-09 NOTE — Telephone Encounter (Signed)
Pt states he just went to the bathroom and states he just passed a bunch of gallstones. Some as big as nickles. States he felt like he had a baby. Thinks he had a big clot of blood about the size of his fingernail. Pain in stomach is a lot better after going to the bathroom. Still having a little pain but much better. States he pulled up a picture of gallstones and it looked just like that.

## 2019-07-13 DIAGNOSIS — Z8379 Family history of other diseases of the digestive system: Secondary | ICD-10-CM | POA: Diagnosis not present

## 2019-07-13 DIAGNOSIS — F419 Anxiety disorder, unspecified: Secondary | ICD-10-CM | POA: Diagnosis not present

## 2019-07-13 DIAGNOSIS — C772 Secondary and unspecified malignant neoplasm of intra-abdominal lymph nodes: Secondary | ICD-10-CM | POA: Diagnosis not present

## 2019-07-13 DIAGNOSIS — Z9079 Acquired absence of other genital organ(s): Secondary | ICD-10-CM | POA: Diagnosis not present

## 2019-07-13 DIAGNOSIS — M8710 Osteonecrosis due to drugs, unspecified bone: Secondary | ICD-10-CM | POA: Diagnosis not present

## 2019-07-13 DIAGNOSIS — M4725 Other spondylosis with radiculopathy, thoracolumbar region: Secondary | ICD-10-CM | POA: Diagnosis not present

## 2019-07-13 DIAGNOSIS — G894 Chronic pain syndrome: Secondary | ICD-10-CM | POA: Diagnosis not present

## 2019-07-13 DIAGNOSIS — J449 Chronic obstructive pulmonary disease, unspecified: Secondary | ICD-10-CM | POA: Diagnosis not present

## 2019-07-13 DIAGNOSIS — J441 Chronic obstructive pulmonary disease with (acute) exacerbation: Secondary | ICD-10-CM | POA: Diagnosis not present

## 2019-07-13 DIAGNOSIS — C61 Malignant neoplasm of prostate: Secondary | ICD-10-CM | POA: Diagnosis not present

## 2019-07-14 ENCOUNTER — Ambulatory Visit (HOSPITAL_COMMUNITY)
Admission: RE | Admit: 2019-07-14 | Discharge: 2019-07-14 | Disposition: A | Discharge status: Home / Self Care | Payer: PPO | Source: Ambulatory Visit | Attending: Family Medicine | Admitting: Family Medicine

## 2019-07-14 ENCOUNTER — Encounter: Payer: Self-pay | Admitting: Gastroenterology

## 2019-07-14 ENCOUNTER — Other Ambulatory Visit: Payer: Self-pay

## 2019-07-14 DIAGNOSIS — K7689 Other specified diseases of liver: Secondary | ICD-10-CM | POA: Diagnosis not present

## 2019-07-14 DIAGNOSIS — K824 Cholesterolosis of gallbladder: Secondary | ICD-10-CM | POA: Diagnosis not present

## 2019-07-14 DIAGNOSIS — R109 Unspecified abdominal pain: Secondary | ICD-10-CM | POA: Diagnosis not present

## 2019-08-05 ENCOUNTER — Other Ambulatory Visit: Payer: Self-pay | Admitting: Cardiology

## 2019-08-05 ENCOUNTER — Other Ambulatory Visit: Payer: Self-pay | Admitting: Family Medicine

## 2019-08-18 ENCOUNTER — Other Ambulatory Visit: Payer: Self-pay

## 2019-08-18 ENCOUNTER — Encounter: Payer: Self-pay | Admitting: Gastroenterology

## 2019-08-18 ENCOUNTER — Ambulatory Visit: Payer: PPO | Admitting: Gastroenterology

## 2019-08-18 VITALS — BP 130/84 | HR 64 | Temp 98.3°F | Ht 69.0 in | Wt 187.8 lb

## 2019-08-18 DIAGNOSIS — K625 Hemorrhage of anus and rectum: Secondary | ICD-10-CM

## 2019-08-18 DIAGNOSIS — R1011 Right upper quadrant pain: Secondary | ICD-10-CM | POA: Diagnosis not present

## 2019-08-18 DIAGNOSIS — Z86010 Personal history of colonic polyps: Secondary | ICD-10-CM

## 2019-08-18 MED ORDER — SUPREP BOWEL PREP KIT 17.5-3.13-1.6 GM/177ML PO SOLN: 1.0000 | ORAL | 0 refills | Status: DC

## 2019-08-18 NOTE — Patient Instructions (Addendum)
You have been scheduled for a colonoscopy. Please follow written instructions given to you at your visit today.  Please pick up your prep supplies at the pharmacy within the next 1-3 days. If you use inhalers (even only as needed), please bring them with you on the day of your procedure. Your physician has requested that you go to www.startemmi.com and enter the access code given to you at your visit today. This web site gives a general overview about your procedure. However, you should still follow specific instructions given to you by our office regarding your preparation for the procedure.     You have been scheduled for a HIDA scan at [facility] Radiology (1st floor) on 09/03/19. Please arrive 15 minutes prior to your scheduled appointment at [id number] am. Make certain not to have anything to eat or drink at least 6 hours prior to your test. DO NOT take any opiate based medications 8 hours prior to your test (I.e. oxycodone). Should this appointment date or time not work well for you, please call radiology scheduling at [phone].  ___________________________________________________________  hepatobiliary (HIDA) scan is an imaging procedure used to diagnose problems in the liver, gallbladder and bile ducts. In the HIDA scan, a radioactive chemical or tracer is injected into a vein in your arm. The tracer is handled by the liver like bile. Bile is a fluid produced and excreted by your liver that helps your digestive system break down fats in the foods you eat. Bile is stored in your gallbladder and the gallbladder releases the bile when you eat a meal. A special nuclear medicine scanner (gamma camera) tracks the flow of the tracer from your liver into your gallbladder and small intestine.  During your HIDA scan  You'll be asked to change into a hospital gown before your HIDA scan begins. Your health care team will position you on a table, usually on your back. The radioactive tracer is then injected  into a vein in your arm.The tracer travels through your bloodstream to your liver, where it's taken up by the bile-producing cells. The radioactive tracer travels with the bile from your liver into your gallbladder and through your bile ducts to your small intestine.You may feel some pressure while the radioactive tracer is injected into your vein. As you lie on the table, a special gamma camera is positioned over your abdomen taking pictures of the tracer as it moves through your body. The gamma camera takes pictures continually for about an hour. You'll need to keep still during the HIDA scan. This can become uncomfortable, but you may find that you can lessen the discomfort by taking deep breaths and thinking about other things. Tell your health care team if you're uncomfortable. The radiologist will watch on a computer the progress of the radioactive tracer through your body. The HIDA scan may be stopped when the radioactive tracer is seen in the gallbladder and enters your small intestine. This typically takes about an hour. In some cases extra imaging will be performed if original images aren't satisfactory, if morphine is given to help visualize the gallbladder or if the medication CCK is given to look at the contraction of the gallbladder. This test typically takes 2 hours to complete. ___________________________________________________________  If you are age 66 or older, your body mass index should be between 23-30. Your Body mass index is 27.73 kg/m. If this is out of the aforementioned range listed, please consider follow up with your Primary Care Provider.  If you are  age 45 or younger, your body mass index should be between 19-25. Your Body mass index is 27.73 kg/m. If this is out of the aformentioned range listed, please consider follow up with your Primary Care Provider.      Due to recent changes in healthcare laws, you may see the results of your imaging and laboratory studies on  MyChart before your provider has had a chance to review them.  We understand that in some cases there may be results that are confusing or concerning to you. Not all laboratory results come back in the same time frame and the provider may be waiting for multiple results in order to interpret others.  Please give [place] 48 hours in order for your provider to thoroughly review all the results before contacting the office for clarification of your results.

## 2019-08-18 NOTE — Progress Notes (Signed)
Review of pertinent gastrointestinal problems: 1. EGD Ardis Hughs 1/2060for rectal bleeding, dysphagia: 1) Mild gastritis; biopsied to check for H. pylori2) GERD related edema at GE junction3) Otherwise normal examination; path showed no h. Pylori.  Repeat EGD November 2017 for dysphagia found a very clear Schatzki's ring, dilated with balloon dilator to 20 mm. 2. Colonoscopy Dr. Ardis Hughs 06/2010 done for minor rectal bleeding. : small polyp(was TA on path), internal hemorrhoids. Colonoscopy Dr. Ardis Hughs 11/2014(for minor rectal bleeding): 2 subCM polyps (one was TA), diverticulosis throughout, + internal hemorrhoids). Was recommended to have repeat colonoscopy in 5 years.  HPI: This is a very pleasant 64 year old man whom I last saw at the time of an upper endoscopy about 3-1/2 years ago.  See those results summarized above  He has metastatic prostate cancer, diagnosed originally in 2007.  This is spread to intra-abdominal lymph nodes.  Seems to be under good control.  Ultrasound February 2021 done for upper abdominal pain showed "2 mm echogenic focus which neither moves nor shadows, a finding indicative of a small polyp" this was present on a previous study.  Gallbladder was otherwise normal.  Today he tells me he has had intermittent right upper quadrant pains for about 2 or 3 months.  They are knifelike.  They can last for up to a week.  The pains are not postprandial.  Moving his bowels sometimes helps the pain.  His weight is overall fluctuating.  He is pretty convinced that these are gallbladder related pains especially since many members of his family and extended family have had gallbladder problems.  He has also had some new rectal bleeding.  Sounds like blood clots, this occurred for about 2 weeks and that ended about 2 weeks ago.  It was not with every bowel movement.  He is not really bothered by constipation.  Colon cancer does not run in his family.  He has never had radiation to his  prostate.   ROS: complete GI ROS as described in HPI, all other review negative.  Constitutional:  No unintentional weight loss   Past Medical History:  Diagnosis Date  . Anxiety   . Asthma   . COPD (chronic obstructive pulmonary disease) ([place])   . Coronary artery disease    DES to LAD 1/12  . DDD (degenerative disc disease)   . Depression   . Drug reaction    Pamidronate - induced skin toxicity, Zometa - induced toxicity  . Essential hypertension   . Gastritis   . GERD (gastroesophageal reflux disease)   . Horseshoe kidney    Single kidney  . Internal hemorrhoids    Colonoscopy 1/12  . Myocardial infarction ([place])    2012  . Osteoarthritis   . 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 Prostate cancer ([place])    Metastatic, stage IV  - [name]  . Psoriasis   . Sigmoid diverticulosis   . Tubular adenoma    Colonoscopy 1/12    Past Surgical History:  Procedure Laterality Date  . ANTERIOR FUSION CERVICAL SPINE    . APPENDECTOMY    . ETHMOIDECTOMY Left 09/28/2013   Procedure: LEFT ENDOSCOPIC ANTERIOR ETHMOIDECTOMY;  Surgeon: [name], MD;  Location: [facility];  Service: ENT;  Laterality: Left;  . HEART STENTS  06/2010   2  . LEFT FOOT SURGERY    . MAXILLARY ANTROSTOMY Left 09/28/2013   Procedure: LEFT ENDOSCOPIC MAXILLARY ANTROSTOMY WITH REMOVAL OF TISSUE;  Surgeon: [name], MD;  Location: [facility];  Service: ENT;  Laterality: Left;  . POSTERIOR FUSION CERVICAL SPINE    . PROSTATECTOMY  02/28/2006   [name] in [place], [place].    Current Outpatient Medications  Medication Sig Dispense Refill  . ALPRAZolam (XANAX) 0.5 MG tablet TAKE 1 TABLET BY MOUTH FOUR TIMES DAILY AS NEEDED FOR ANXIETY 120 tablet 5  . aspirin EC 81 MG EC tablet Take 1 tablet (81 mg total) by mouth daily.    . calcipotriene (DOVONOX) 0.005 % cream Apply topically 2 (two) times daily.     . calcipotriene-betamethasone (TACLONEX) ointment Apply 1 application  topically daily. 60 g 3  . chlorhexidine (PERIDEX) 0.12 % solution USE 15 ML BY MOUTH TO SWISH AND SPIT TWICE DAILY 473 mL 0  . Clobetasol Propionate (TEMOVATE) 0.05 % external spray APPLY EXTERNALLY TO THE AFFECTED AREA UP TO TWICE DAILY AS NEEDED 59 mL 0  . Clobetasol Propionate 0.05 % shampoo USE SHAMPOO AS NEEDED 118 mL 11  . Elastic Bandages & Supports (LUMBAR BACK BRACE/SUPPORT PAD) MISC 1 each by Does not apply route as directed. 1 each 1  . fluticasone (FLONASE) 50 MCG/ACT nasal spray Place 1 spray into both nostrils daily.  0  . gabapentin (NEURONTIN) 300 MG capsule Take 300 mg by mouth at bedtime.    . mupirocin ointment (BACTROBAN) 2 % Apply 1 application topically 2 (two) times daily. To rash 30 g 0  . nitroGLYCERIN (NITROSTAT) 0.4 MG SL tablet Place 1 tablet (0.4 mg total) under the tongue every 5 (five) minutes as needed for chest pain. 25 tablet 3  . oxyCODONE (OXYCONTIN) 60 MG 12 hr tablet Take 60 mg by mouth every 8 (eight) hours. 90 each 0  . Oxycodone HCl 10 MG TABS Take 1-2 tablets (10-20 mg total) by mouth every 4 (four) hours as needed. 180 tablet 0  . pantoprazole (PROTONIX) 40 MG tablet TAKE 1 TABLET(40 MG) BY MOUTH DAILY 90 tablet 1  . pravastatin (PRAVACHOL) 80 MG tablet TAKE 1 TABLET BY MOUTH EVERY DAY 90 tablet 0  . PROAIR HFA 108 (90 BASE) MCG/ACT inhaler inhale 1 puff every 4 hours WITH AEROCHAMBER 8.5 g 1  . tamsulosin (FLOMAX) 0.4 MG CAPS capsule Take 1 capsule (0.4 mg total) by mouth at bedtime. 30 capsule 5   Current Facility-Administered Medications  Medication Dose Route Frequency Provider Last Rate Last Admin  . 0.9 %  sodium chloride infusion  500 mL Intravenous Continuous Milus Banister, MD        Allergies as of 08/18/2019 - Review Complete 08/18/2019  Allergen Reaction Noted  . Other Other (See Comments) 09/28/2013  . Zoledronic acid    . Pamidronate Other (See Comments) and Rash 12/24/2012    Family History  Problem Relation Age of Onset  .  Lung cancer Father   . Heart attack Brother   . Alzheimer's disease Mother   . Colon cancer Maternal Uncle     Social History   Socioeconomic History  . Marital status: Legally Separated    Spouse name: Not on file  . Number of children: 3  . Years of education: Not on file  . Highest education level: Not on file  Occupational History  . Occupation: Retail buyer, small jobs    Comment: Disable, Environmental health practitioner: UNEMPLOYED  Tobacco Use  . Smoking status: Former Smoker    Packs/day: 1.00    Years: 15.00    Pack years: 15.00  Types: Cigarettes    Quit date: 02/20/2006    Years since quitting: 13.4  . Smokeless tobacco: Never Used  Substance and Sexual Activity  . Alcohol use: No    Alcohol/week: 0.0 standard drinks    Comment: Quit in 2007. Previous 12 pack of beer per week.  . Drug use: No  . Sexual activity: Never  Other Topics Concern  . Not on file  Social History Narrative  . Not on file   Social Determinants of Health   Financial Resource Strain:   . Difficulty of Paying Living Expenses:   Food Insecurity:   . Worried About Charity fundraiser in the Last Year:   . Arboriculturist in the Last Year:   Transportation Needs:   . Film/video editor (Medical):   Marland Kitchen Lack of Transportation (Non-Medical):   Physical Activity:   . Days of Exercise per Week:   . Minutes of Exercise per Session:   Stress:   . Feeling of Stress :   Social Connections:   . Frequency of Communication with Friends and Family:   . Frequency of Social Gatherings with Friends and Family:   . Attends Religious Services:   . Active Member of Clubs or Organizations:   . Attends Archivist Meetings:   Marland Kitchen Marital Status:   Intimate Partner Violence:   . Fear of Current or Ex-Partner:   . Emotionally Abused:   Marland Kitchen Physically Abused:   . Sexually Abused:      Physical Exam: Temp 98.3 F (36.8 C)   Ht 5\' 9"  (1.753 m)   Wt 187 lb 12.8 oz (85.2 kg)   BMI 27.73 kg/m   Constitutional: generally well-appearing Psychiatric: alert and oriented x3 Abdomen: soft, nontender, nondistended, no obvious ascites, no peritoneal signs, normal bowel sounds No peripheral edema noted in lower extremities Rectal exam deferred for upcoming colonoscopy  Assessment and plan: 64 y.o. male with new rectal bleeding, personal history of adenomatous colon polyps, intermittent right upper quadrant pains  First is right upper quadrant pains are possibly related to mild constipation since the pains seem to be often improved when he moves his bowels.  He did have a small gallbladder polyp, too small to really even follow per radiology guidelines.  He is pretty convinced that his gallbladder is causing his intermittent pains and so I offered a HIDA scan to estimate gallbladder ejection fraction, check for biliary dyskinesia.  His mild rectal bleeding seems anorectal in origin.  He has had precancerous polyps in the past and his last colonoscopy was about 5 years ago and so I recommended repeating his colonoscopy now to exclude other potential causes such as neoplasm which I think is unlikely.  Please see the "Patient Instructions" section for addition details about the plan.  Owens Loffler, MD [facility] 08/18/2019, 10:32 AM   Total time on date of encounter was 30 minutes (this included time spent preparing to see the patient reviewing records; obtaining and/or reviewing separately obtained history; performing a medically appropriate exam and/or evaluation; counseling and educating the patient and family if present; ordering medications, tests or procedures if applicable; and documenting clinical information in the health record).

## 2019-08-20 ENCOUNTER — Other Ambulatory Visit: Payer: Self-pay | Admitting: Gastroenterology

## 2019-08-20 ENCOUNTER — Ambulatory Visit (INDEPENDENT_AMBULATORY_CARE_PROVIDER_SITE_OTHER): Payer: PPO

## 2019-08-20 DIAGNOSIS — Z1159 Encounter for screening for other viral diseases: Secondary | ICD-10-CM

## 2019-08-21 ENCOUNTER — Encounter: Payer: Self-pay | Admitting: Gastroenterology

## 2019-08-21 LAB — SARS CORONAVIRUS 2 (TAT 6-24 HRS): SARS Coronavirus 2: NEGATIVE

## 2019-08-24 ENCOUNTER — Other Ambulatory Visit: Payer: Self-pay

## 2019-08-24 ENCOUNTER — Encounter: Payer: Self-pay | Admitting: Gastroenterology

## 2019-08-24 ENCOUNTER — Ambulatory Visit (AMBULATORY_SURGERY_CENTER): Payer: PPO | Admitting: Gastroenterology

## 2019-08-24 VITALS — BP 113/74 | HR 56 | Temp 97.5°F | Resp 15 | Ht 69.0 in | Wt 187.0 lb

## 2019-08-24 DIAGNOSIS — K5730 Diverticulosis of large intestine without perforation or abscess without bleeding: Secondary | ICD-10-CM | POA: Diagnosis not present

## 2019-08-24 DIAGNOSIS — K648 Other hemorrhoids: Secondary | ICD-10-CM | POA: Diagnosis not present

## 2019-08-24 DIAGNOSIS — R1011 Right upper quadrant pain: Secondary | ICD-10-CM

## 2019-08-24 DIAGNOSIS — Z86010 Personal history of colonic polyps: Secondary | ICD-10-CM | POA: Diagnosis not present

## 2019-08-24 DIAGNOSIS — D123 Benign neoplasm of transverse colon: Secondary | ICD-10-CM | POA: Diagnosis not present

## 2019-08-24 HISTORY — PX: COLONOSCOPY: SHX174

## 2019-08-24 MED ORDER — SODIUM CHLORIDE 0.9 % IV SOLN: 500.0000 mL | Freq: Once | INTRAVENOUS | Status: DC

## 2019-08-24 NOTE — Progress Notes (Signed)
pt tolerated well. VSS. awake and to recovery. Report given to RN.  

## 2019-08-24 NOTE — Op Note (Signed)
[facility] Patient Name: [name] [name] Procedure Date: 08/24/2019 8:49 AM MRN: [id number] Endoscopist: Milus Banister , MD Age: 64 Referring MD:  Date of Birth: [birth year] Gender: Male Account #: [id number] Procedure:                Colonoscopy Indications:              Evaluation of unexplained GI bleeding presenting                            with Hematochezia; Colonoscopy Dr. Ardis Hughs 06/2010                            done for minor rectal bleeding. : small polyp(was                            TA on path), internal hemorrhoids. Colonoscopy Dr.                            Ardis Hughs 11/2014(for minor rectal bleeding): 2 subCM                            polyps (one was TA), diverticulosis throughout, +                            internal hemorrhoids). Medicines:                Monitored Anesthesia Care Procedure:                Pre-Anesthesia Assessment:                           - Prior to the procedure, a History and Physical                            was performed, and patient medications and                            allergies were reviewed. The patient's tolerance of                            previous anesthesia was also reviewed. The risks                            and benefits of the procedure and the sedation                            options and risks were discussed with the patient.                            All questions were answered, and informed consent                            was obtained. Prior Anticoagulants: The patient has  taken no previous anticoagulant or antiplatelet                            agents. ASA Grade Assessment: III - A patient with                            severe systemic disease. After reviewing the risks                            and benefits, the patient was deemed in                            satisfactory condition to undergo the procedure.                           After obtaining informed  consent, the colonoscope                            was passed under direct vision. Throughout the                            procedure, the patient's blood pressure, pulse, and                            oxygen saturations were monitored continuously. The                            Colonoscope was introduced through the anus and                            advanced to the the cecum, identified by                            appendiceal orifice and ileocecal valve. The                            colonoscopy was performed without difficulty. The                            patient tolerated the procedure well. The quality                            of the bowel preparation was good. The ileocecal                            valve, appendiceal orifice, and rectum were                            photographed. Scope In: 8:53:07 AM Scope Out: 9:02:20 AM Scope Withdrawal Time: 0 hours 7 minutes 34 seconds  Total Procedure Duration: 0 hours 9 minutes 13 seconds  Findings:                 A 3 mm polyp was found in the transverse colon. The  polyp was sessile. The polyp was removed with a                            cold snare. Resection and retrieval were complete.                           Many small and large-mouthed diverticula were found                            in the entire colon.                           Internal hemorrhoids were found. The hemorrhoids                            were small.                           The exam was otherwise without abnormality on                            direct and retroflexion views. Complications:            No immediate complications. Estimated blood loss:                            None. Estimated Blood Loss:     Estimated blood loss: none. Impression:               - One 3 mm polyp in the transverse colon, removed                            with a cold snare. Resected and retrieved.                           - Diverticulosis  in the entire examined colon.                           - Internal hemorrhoids (very likely the source or                            your minor rectal bleeding)                           - The examination was otherwise normal on direct                            and retroflexion views. Recommendation:           - Patient has a contact number available for                            emergencies. The signs and symptoms of potential                            delayed complications were discussed with the  patient. Return to normal activities tomorrow.                            Written discharge instructions were provided to the                            patient.                           - Resume previous diet.                           - Continue present medications.                           - Await pathology results.                           - Consider in-office hemorrhoidal banding for your                            internal hemorrohids. Milus Banister, MD 08/24/2019 9:05:51 AM This report has been signed electronically.

## 2019-08-24 NOTE — Progress Notes (Signed)
Temp check by: JB Vital check by: KA  The medical and surgical history was reviewed and verified with the patient.

## 2019-08-24 NOTE — Progress Notes (Signed)
Called to room to assist during endoscopic procedure.  Patient ID and intended procedure confirmed with present staff. Received instructions for my participation in the procedure from the performing physician.  

## 2019-08-24 NOTE — Patient Instructions (Signed)
Handout on polyps, diverticulosis and hemorrhoids given. ? ?YOU HAD AN ENDOSCOPIC PROCEDURE TODAY AT THE  ENDOSCOPY CENTER:   Refer to the procedure report that was given to you for any specific questions about what was found during the examination.  If the procedure report does not answer your questions, please call your gastroenterologist to clarify.  If you requested that your care partner not be given the details of your procedure findings, then the procedure report has been included in a sealed envelope for you to review at your convenience later. ? ?YOU SHOULD EXPECT: Some feelings of bloating in the abdomen. Passage of more gas than usual.  Walking can help get rid of the air that was put into your GI tract during the procedure and reduce the bloating. If you had a lower endoscopy (such as a colonoscopy or flexible sigmoidoscopy) you may notice spotting of blood in your stool or on the toilet paper. If you underwent a bowel prep for your procedure, you may not have a normal bowel movement for a few days. ? ?Please Note:  You might notice some irritation and congestion in your nose or some drainage.  This is from the oxygen used during your procedure.  There is no need for concern and it should clear up in a day or so. ? ?SYMPTOMS TO REPORT IMMEDIATELY: ? ?Following lower endoscopy (colonoscopy or flexible sigmoidoscopy): ? Excessive amounts of blood in the stool ? Significant tenderness or worsening of abdominal pains ? Swelling of the abdomen that is new, acute ? Fever of 100?F or higher ? ? ?For urgent or emergent issues, a gastroenterologist can be reached at any hour by calling [phone]. ?Do not use MyChart messaging for urgent concerns.  ? ? ?DIET:  We do recommend a small meal at first, but then you may proceed to your regular diet.  Drink plenty of fluids but you should avoid alcoholic beverages for 24 hours. ? ?ACTIVITY:  You should plan to take it easy for the rest of today and you  should NOT DRIVE or use heavy machinery until tomorrow (because of the sedation medicines used during the test).   ? ?FOLLOW UP: ?Our staff will call the number listed on your records 48-72 hours following your procedure to check on you and address any questions or concerns that you may have regarding the information given to you following your procedure. If we do not reach you, we will leave a message.  We will attempt to reach you two times.  During this call, we will ask if you have developed any symptoms of COVID 19. If you develop any symptoms (ie: fever, flu-like symptoms, shortness of breath, cough etc.) before then, please call [phone].  If you test positive for Covid 19 in the 2 weeks post procedure, please call and report this information to us.   ? ?If any biopsies were taken you will be contacted by phone or by letter within the next 1-3 weeks.  Please call us at [phone] if you have not heard about the biopsies in 3 weeks.  ? ? ?SIGNATURES/CONFIDENTIALITY: ?You and/or your care partner have signed paperwork which will be entered into your electronic medical record.  These signatures attest to the fact that that the information above on your After Visit Summary has been reviewed and is understood.  Full responsibility of the confidentiality of this discharge information lies with you and/or your care-partner.  ?

## 2019-08-25 ENCOUNTER — Telehealth: Payer: Self-pay | Admitting: Gastroenterology

## 2019-08-25 ENCOUNTER — Telehealth: Payer: Self-pay | Admitting: *Deleted

## 2019-08-25 NOTE — Telephone Encounter (Signed)
Pharmacy has been updated in chart.

## 2019-08-25 NOTE — Telephone Encounter (Signed)
Patient calls to let us know he is changing pharmacies and future refills will need to go to [facility].

## 2019-08-26 ENCOUNTER — Telehealth: Payer: Self-pay

## 2019-08-26 NOTE — Telephone Encounter (Signed)
Second follow up call attempt, unable to LM

## 2019-08-26 NOTE — Telephone Encounter (Signed)
First attempt follow up  Call to pt, lm on vm.

## 2019-09-01 ENCOUNTER — Encounter: Payer: Self-pay | Admitting: Gastroenterology

## 2019-09-01 ENCOUNTER — Ambulatory Visit: Payer: PPO | Admitting: Gastroenterology

## 2019-09-01 VITALS — BP 120/80 | HR 68 | Temp 98.1°F | Ht 69.0 in | Wt 185.0 lb

## 2019-09-01 DIAGNOSIS — K648 Other hemorrhoids: Secondary | ICD-10-CM

## 2019-09-01 NOTE — Progress Notes (Signed)
This is a 64 year old male referred by Owens Loffler, MD for consideration of hemorrhoidal banding.  Patient relates passing small blood clots per rectum described as about the size of his fingernail.  This occurred several times over the course of 3 days and then resolved.  He denies constipation, straining with bowel movements, hemorrhoid prolapse, swelling, itching, fecal soiling.  He has had no rectal bleeding or other colorectal complaints since his bleeding resolved about 1 month ago.  He states he was lifting heavy logs and splitting logs for several hours prior to the onset of symptoms.  He generally does not have constipation or straining with bowel movements.    Colonoscopy by Dr. Ardis Hughs on August 24, 2019 showed: 1 small transverse colon polyp, diverticulosis in the entire colon and small internal hemorrhoids.  Pathology: tubular adenoma.  Impression and recommendations:  1.  Small internal hemorrhoids with self limited rectal bleeding.  We discussed hemorrhoidal banding. The indications for hemorrhoidal banding include persistent internal hemorrhoidal bleeding and internal hemorrhoid prolapse. After a discussion and shared decision-making we will reserve the option of hemorrhoidal banding for ongoing internal hemorrhoidal symptoms.  He is advised to contact us if he has recurrent or persistent hemorrhoidal symptoms.  No charge for this visit. Follow-up Dr. Ardis Hughs as planned.

## 2019-09-01 NOTE — Patient Instructions (Signed)
Follow up with Dr. Jacobs as needed. 

## 2019-09-03 ENCOUNTER — Ambulatory Visit (HOSPITAL_COMMUNITY)
Admission: RE | Admit: 2019-09-03 | Discharge: 2019-09-03 | Disposition: A | Discharge status: Home / Self Care | Payer: PPO | Source: Ambulatory Visit | Attending: Gastroenterology | Admitting: Gastroenterology

## 2019-09-03 ENCOUNTER — Other Ambulatory Visit: Payer: Self-pay

## 2019-09-03 DIAGNOSIS — R1011 Right upper quadrant pain: Secondary | ICD-10-CM | POA: Insufficient documentation

## 2019-09-03 MED ORDER — TECHNETIUM TC 99M MEBROFENIN IV KIT
5.2000 | PACK | Freq: Once | INTRAVENOUS | Status: AC | PRN
  Administered 2019-09-03: 5.2 via INTRAVENOUS

## 2019-09-07 DIAGNOSIS — F419 Anxiety disorder, unspecified: Secondary | ICD-10-CM | POA: Diagnosis not present

## 2019-09-07 DIAGNOSIS — G894 Chronic pain syndrome: Secondary | ICD-10-CM | POA: Diagnosis not present

## 2019-09-07 DIAGNOSIS — C772 Secondary and unspecified malignant neoplasm of intra-abdominal lymph nodes: Secondary | ICD-10-CM | POA: Diagnosis not present

## 2019-09-07 DIAGNOSIS — M8710 Osteonecrosis due to drugs, unspecified bone: Secondary | ICD-10-CM | POA: Diagnosis not present

## 2019-09-07 DIAGNOSIS — M4725 Other spondylosis with radiculopathy, thoracolumbar region: Secondary | ICD-10-CM | POA: Diagnosis not present

## 2019-09-07 DIAGNOSIS — C61 Malignant neoplasm of prostate: Secondary | ICD-10-CM | POA: Diagnosis not present

## 2019-09-15 ENCOUNTER — Telehealth: Payer: Self-pay | Admitting: Gastroenterology

## 2019-10-28 ENCOUNTER — Telehealth: Payer: Self-pay | Admitting: *Deleted

## 2019-10-28 DIAGNOSIS — Z79899 Other long term (current) drug therapy: Secondary | ICD-10-CM

## 2019-10-28 DIAGNOSIS — E782 Mixed hyperlipidemia: Secondary | ICD-10-CM

## 2019-10-28 NOTE — Telephone Encounter (Signed)
Blood work ordered in Epic. Patient notified. 

## 2019-10-28 NOTE — Telephone Encounter (Signed)
Patient has a med check scheduled 11/10/2019  Last labs 05/12/2019: Lipid, liver, Met 7 and PSA

## 2019-10-28 NOTE — Telephone Encounter (Signed)
Pls order cbc, cmp, lipid panel.  Fasting.  Obtain 3-7 days prior to appt.  Thx,   Dr. Lovena Le

## 2019-11-09 DIAGNOSIS — K08199 Complete loss of teeth due to other specified cause, unspecified class: Secondary | ICD-10-CM | POA: Diagnosis not present

## 2019-11-09 DIAGNOSIS — Z9079 Acquired absence of other genital organ(s): Secondary | ICD-10-CM | POA: Diagnosis not present

## 2019-11-09 DIAGNOSIS — C61 Malignant neoplasm of prostate: Secondary | ICD-10-CM | POA: Diagnosis not present

## 2019-11-09 DIAGNOSIS — G894 Chronic pain syndrome: Secondary | ICD-10-CM | POA: Diagnosis not present

## 2019-11-09 DIAGNOSIS — C772 Secondary and unspecified malignant neoplasm of intra-abdominal lymph nodes: Secondary | ICD-10-CM | POA: Diagnosis not present

## 2019-11-09 DIAGNOSIS — Z9189 Other specified personal risk factors, not elsewhere classified: Secondary | ICD-10-CM | POA: Diagnosis not present

## 2019-11-09 DIAGNOSIS — M810 Age-related osteoporosis without current pathological fracture: Secondary | ICD-10-CM | POA: Diagnosis not present

## 2019-11-09 DIAGNOSIS — M8710 Osteonecrosis due to drugs, unspecified bone: Secondary | ICD-10-CM | POA: Diagnosis not present

## 2019-11-10 ENCOUNTER — Ambulatory Visit (INDEPENDENT_AMBULATORY_CARE_PROVIDER_SITE_OTHER): Payer: PPO | Admitting: Family Medicine

## 2019-11-10 ENCOUNTER — Other Ambulatory Visit: Payer: Self-pay

## 2019-11-10 ENCOUNTER — Encounter: Payer: Self-pay | Admitting: Family Medicine

## 2019-11-10 VITALS — BP 122/74 | HR 86 | Temp 98.0°F | Ht 69.0 in

## 2019-11-10 DIAGNOSIS — C61 Malignant neoplasm of prostate: Secondary | ICD-10-CM

## 2019-11-10 DIAGNOSIS — J449 Chronic obstructive pulmonary disease, unspecified: Secondary | ICD-10-CM

## 2019-11-10 DIAGNOSIS — C772 Secondary and unspecified malignant neoplasm of intra-abdominal lymph nodes: Secondary | ICD-10-CM

## 2019-11-10 DIAGNOSIS — G894 Chronic pain syndrome: Secondary | ICD-10-CM | POA: Diagnosis not present

## 2019-11-10 DIAGNOSIS — E782 Mixed hyperlipidemia: Secondary | ICD-10-CM | POA: Diagnosis not present

## 2019-11-10 MED ORDER — ALBUTEROL SULFATE HFA 108 (90 BASE) MCG/ACT IN AERS: INHALATION_SPRAY | RESPIRATORY_TRACT | 2 refills | Status: DC

## 2019-11-10 MED ORDER — FLUTICASONE PROPIONATE 50 MCG/ACT NA SUSP: 1.0000 | Freq: Every day | NASAL | 2 refills | Status: DC

## 2019-11-10 NOTE — Progress Notes (Signed)
Patient ID: [name] [name], male    DOB: 03/07/1956, 64 y.o.   MRN: [id number]   Chief Complaint  Patient presents with  . Hyperlipidemia   Subjective:    HPI  pt arrives for a med check up- copd and h/o metastatic prostate cancer. Needs refill on albuterol inhaler.  Pt has a h/o metastatic prostate cancer.  Seeing urology every 3-4 months for check up. Stopped smoking when dx with prostate cancer. H/o copd- needing refill on inhaler and flonase.  No cp, sob, coughing at this time.  Oncology is keeping up with his labs.  Seeing Dr. Tressie [name]- oncology, and ordering his pain medications-  oxycontin and oxycodone. [facility] is checking his labs.  Reviewed and stable.  Psoriasis- taking lotions and shampoo instead of oral meds. Due to concern of it affecting his liver. Using clobetasol cram and shampoo.  Using calicpotriene- ointment.    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   Outpatient Encounter Medications as of 11/10/2019  Medication Sig  . albuterol (PROAIR HFA) 108 (90 Base) MCG/ACT inhaler inhale 1 puff every 4 hours WITH AEROCHAMBER  . ALPRAZolam (XANAX) 0.5 MG tablet TAKE 1 TABLET BY MOUTH FOUR TIMES DAILY AS NEEDED FOR ANXIETY  . aspirin EC 81 MG EC tablet Take 1 tablet (81 mg total) by mouth daily.  . calcipotriene (DOVONOX) 0.005 % cream Apply topically 2 (two) times daily.   . calcipotriene-betamethasone (TACLONEX) ointment Apply 1 application topically daily.  . Clobetasol Propionate (TEMOVATE) 0.05 % external spray APPLY  EXTERNALLY TO THE AFFECTED AREA UP TO TWICE DAILY AS NEEDED  . Clobetasol Propionate 0.05 % shampoo USE SHAMPOO AS NEEDED  . Elastic Bandages & Supports (LUMBAR BACK BRACE/SUPPORT PAD) MISC 1 each by Does not apply route as directed.  . fluticasone (FLONASE) 50 MCG/ACT nasal spray Place 1 spray into both nostrils daily.  . mupirocin ointment (BACTROBAN) 2 % Apply 1 application topically 2 (two) times daily. To rash  . nitroGLYCERIN (NITROSTAT) 0.4 MG SL tablet Place 1 tablet (0.4 mg total) under the tongue every 5 (five) minutes as needed for chest pain.  Marland Kitchen oxyCODONE (OXYCONTIN) 60 MG 12 hr tablet Take 60 mg by mouth every 8 (eight) hours.  . Oxycodone HCl 10 MG TABS Take 1-2 tablets (10-20 mg total) by mouth every 4 (four) hours as needed.  . pantoprazole (PROTONIX) 40 MG tablet TAKE 1 TABLET(40 MG) BY MOUTH DAILY  . [DISCONTINUED] fluticasone (FLONASE) 50 MCG/ACT nasal spray Place 1 spray into both nostrils daily.  . [DISCONTINUED] pravastatin (PRAVACHOL) 80 MG tablet TAKE 1 TABLET BY MOUTH EVERY DAY  . [DISCONTINUED] PROAIR HFA 108 (90 BASE) MCG/ACT inhaler inhale 1 puff every 4 hours WITH AEROCHAMBER  . [DISCONTINUED] chlorhexidine (PERIDEX) 0.12 % solution USE 15 ML BY MOUTH TO SWISH AND SPIT TWICE DAILY  . [DISCONTINUED] tamsulosin (FLOMAX) 0.4 MG CAPS capsule Take 1 capsule (0.4 mg total) by mouth at bedtime.   No facility-administered encounter medications on file as of 11/10/2019.     Review of Systems  Constitutional: Negative for chills and fever.  HENT: Negative for congestion, rhinorrhea and sore throat.   Respiratory: Negative for cough, shortness of breath and wheezing.  Cardiovascular: Negative for chest pain and leg swelling.  Gastrointestinal: Negative for abdominal pain, diarrhea, nausea and vomiting.  Genitourinary: Negative for dysuria and frequency.  Skin: Negative for rash.  Neurological: Negative for dizziness, weakness and headaches.    Vitals BP 122/74   Pulse  86   Temp 98 F (36.7 C)   Ht 5\' 9"  (1.753 m)   SpO2 95%   BMI 27.32 kg/m   Objective:   Physical Exam Vitals and nursing note reviewed.  Constitutional:      General: He is not in acute distress.    Appearance: Normal appearance. He is not ill-appearing.  Cardiovascular:     Rate and Rhythm: Normal rate and regular rhythm.     Pulses: Normal pulses.     Heart sounds: Normal heart sounds. No murmur.  Pulmonary:     Effort: Pulmonary effort is normal. No respiratory distress.     Breath sounds: Normal breath sounds.  Musculoskeletal:        General: Normal range of motion.     Right lower leg: No edema.     Left lower leg: No edema.  Skin:    General: Skin is warm and dry.     Findings: No rash.  Neurological:     General: No focal deficit present.     Mental Status: He is alert and oriented to person, place, and time.     Cranial Nerves: No cranial nerve deficit.  Psychiatric:        Mood and Affect: Mood normal.        Behavior: Behavior normal.        Thought Content: Thought content normal.        Judgment: Judgment normal.     Assessment and Plan   1. Chronic obstructive pulmonary disease, unspecified COPD type (HCC) - albuterol (PROAIR HFA) 108 (90 Base) MCG/ACT inhaler; inhale 1 puff every 4 hours WITH AEROCHAMBER  Dispense: 18 g; Refill: 2 - fluticasone (FLONASE) 50 MCG/ACT nasal spray; Place 1 spray into both nostrils daily.  Dispense: 16 g; Refill: 2  2. 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 Chronic pain syndrome  4. Mixed hyperlipidemia   Copd- stable, needing refill on albuterol and flonase. metastatic prostate cancer-stable, they are following his labs and psa.  At this time psa- <0.1.  Seeing pain management for pain meds refills.   HLD- stable, cont pravachol. Recheck labs in 17mo.  F/u with oncology as scheduled q5months.  F/u in office and recheck labs in 35mo.

## 2019-11-11 ENCOUNTER — Other Ambulatory Visit: Payer: Self-pay | Admitting: *Deleted

## 2019-11-11 MED ORDER — PRAVASTATIN SODIUM 80 MG PO TABS: 80.0000 mg | ORAL_TABLET | Freq: Every day | ORAL | 1 refills | Status: DC

## 2019-11-16 ENCOUNTER — Other Ambulatory Visit: Payer: Self-pay | Admitting: Family Medicine

## 2019-11-30 ENCOUNTER — Other Ambulatory Visit (HOSPITAL_COMMUNITY): Payer: Self-pay | Admitting: Oncology

## 2019-11-30 DIAGNOSIS — C61 Malignant neoplasm of prostate: Secondary | ICD-10-CM

## 2020-01-29 ENCOUNTER — Other Ambulatory Visit (HOSPITAL_COMMUNITY): Payer: Self-pay | Admitting: Psychiatry

## 2020-02-11 ENCOUNTER — Other Ambulatory Visit: Payer: Self-pay | Admitting: Cardiology

## 2020-02-15 DIAGNOSIS — L400 Psoriasis vulgaris: Secondary | ICD-10-CM | POA: Diagnosis not present

## 2020-02-15 DIAGNOSIS — F424 Excoriation (skin-picking) disorder: Secondary | ICD-10-CM | POA: Diagnosis not present

## 2020-03-04 ENCOUNTER — Other Ambulatory Visit: Payer: Self-pay | Admitting: Cardiology

## 2020-03-04 MED ORDER — NITROGLYCERIN 0.4 MG SL SUBL: 0.4000 mg | SUBLINGUAL_TABLET | SUBLINGUAL | 3 refills | Status: DC | PRN

## 2020-03-04 NOTE — Telephone Encounter (Signed)
Done

## 2020-03-04 NOTE — Telephone Encounter (Signed)
    1. Which medications need to be refilled? (please list name of each medication and dose if known) NITROGLYCERIN 0.4   2. Which pharmacy/location (including street and city if local pharmacy) is medication to be sent to?[facility]  3. Do they need a 30 day or 90 day supply?   Please change patients Pharmacy to [facility].  Patient has upcoming appointment with A.Quinn.

## 2020-03-09 DIAGNOSIS — C61 Malignant neoplasm of prostate: Secondary | ICD-10-CM | POA: Diagnosis not present

## 2020-03-09 DIAGNOSIS — F329 Major depressive disorder, single episode, unspecified: Secondary | ICD-10-CM | POA: Diagnosis not present

## 2020-03-09 DIAGNOSIS — J441 Chronic obstructive pulmonary disease with (acute) exacerbation: Secondary | ICD-10-CM | POA: Diagnosis not present

## 2020-03-09 DIAGNOSIS — C772 Secondary and unspecified malignant neoplasm of intra-abdominal lymph nodes: Secondary | ICD-10-CM | POA: Diagnosis not present

## 2020-03-09 DIAGNOSIS — M8710 Osteonecrosis due to drugs, unspecified bone: Secondary | ICD-10-CM | POA: Diagnosis not present

## 2020-03-09 DIAGNOSIS — Z87891 Personal history of nicotine dependence: Secondary | ICD-10-CM | POA: Diagnosis not present

## 2020-03-09 DIAGNOSIS — G894 Chronic pain syndrome: Secondary | ICD-10-CM | POA: Diagnosis not present

## 2020-03-12 ENCOUNTER — Other Ambulatory Visit (INDEPENDENT_AMBULATORY_CARE_PROVIDER_SITE_OTHER): Payer: PPO | Admitting: *Deleted

## 2020-03-12 DIAGNOSIS — Z23 Encounter for immunization: Secondary | ICD-10-CM | POA: Diagnosis not present

## 2020-03-16 ENCOUNTER — Other Ambulatory Visit (HOSPITAL_COMMUNITY): Payer: Self-pay | Admitting: Oncology

## 2020-03-21 ENCOUNTER — Other Ambulatory Visit (HOSPITAL_COMMUNITY): Payer: Self-pay | Admitting: Oncology

## 2020-03-21 NOTE — Progress Notes (Deleted)
Cardiology Office Note  Date: 03/22/2020   ID: [name] [name], DOB [birth year], MRN [id number]  PCP:  Erven Colla, DO  Cardiologist:  Rozann Lesches, MD Electrophysiologist:  None   Chief Complaint: Follow-up CAD  History of Present Illness: [name] [name] is a 65 y.o. male with a history of CAD (status post DES to LAD 2012), COPD, GERD, degenerative disc disease.  Last encounter January 27, 2019 with Dr. Domenic Polite.  He did not report any significant anginal symptoms or nitroglycerin use.  He was working outdoors.  He had stopped his Toprol-XL during the winter stating his feet and hands were cold all the time.  This improved off the medication.  He was bradycardic at bradycardia at baseline.  He remained on statin therapy with last LDL at 87.  Patient had no active anginal symptoms.  Dr. Domenic Polite agreed with patient stopping beta-blocker given prior symptoms.  Patient felt better and continued with a relatively low heart rate at baseline.  He was continuing Pravachol.  Blood pressure was normal and no changes were made.    Past Medical History:  Diagnosis Date  . Anxiety   . Asthma   . COPD (chronic obstructive pulmonary disease) ([place])   . Coronary artery disease    DES to LAD 1/12  . DDD (degenerative disc disease)   . Depression   . Drug reaction    Pamidronate - induced skin toxicity, Zometa - induced toxicity  . Essential hypertension   . Gastritis   . GERD (gastroesophageal reflux disease)   . Horseshoe kidney    Single kidney  . Internal hemorrhoids    Colonoscopy 1/12  . Myocardial infarction ([place])    2012  . Osteoarthritis   . 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 Prostate cancer ([place])    Metastatic, stage IV  - [name]  . Psoriasis   . Sigmoid diverticulosis   . Tubular adenoma    Colonoscopy 1/12    Past Surgical History:  Procedure Laterality Date  . ANTERIOR FUSION CERVICAL SPINE    . APPENDECTOMY    . COLONOSCOPY  08/24/2019  .  ETHMOIDECTOMY Left 09/28/2013   Procedure: LEFT ENDOSCOPIC ANTERIOR ETHMOIDECTOMY;  Surgeon: [name], MD;  Location: [facility];  Service: ENT;  Laterality: Left;  . HEART STENTS  06/2010   2  . LEFT FOOT SURGERY    . MAXILLARY ANTROSTOMY Left 09/28/2013   Procedure: LEFT ENDOSCOPIC MAXILLARY ANTROSTOMY WITH REMOVAL OF TISSUE;  Surgeon: [name], MD;  Location: [facility];  Service: ENT;  Laterality: Left;  . POSTERIOR FUSION CERVICAL SPINE    . PROSTATECTOMY  02/28/2006   [name] in [place], [place].    Current Outpatient Medications  Medication Sig Dispense Refill  . albuterol (PROAIR HFA) 108 (90 Base) MCG/ACT inhaler inhale 1 puff every 4 hours WITH AEROCHAMBER 18 g 2  . ALPRAZolam (XANAX) 0.5 MG tablet TAKE 1 TABLET BY MOUTH FOUR TIMES DAILY AS NEEDED FOR ANXIETY 120 tablet 5  . aspirin EC 81 MG EC tablet Take 1 tablet (81 mg total) by mouth daily.    . calcipotriene (DOVONOX) 0.005 % cream Apply topically 2 (two) times daily.     . calcipotriene-betamethasone (TACLONEX) ointment Apply 1 application topically daily. 60 g 3  . Clobetasol Propionate (TEMOVATE) 0.05 % external spray APPLY EXTERNALLY TO THE AFFECTED AREA UP TO TWICE DAILY AS NEEDED 59 mL 0  .  Clobetasol Propionate 0.05 % shampoo USE SHAMPOO AS NEEDED 118 mL 11  . Elastic Bandages & Supports (LUMBAR BACK BRACE/SUPPORT PAD) MISC 1 each by Does not apply route as directed. 1 each 1  . fluticasone (FLONASE) 50 MCG/ACT nasal spray Place 1 spray into both nostrils daily. 16 g 2  . mupirocin ointment (BACTROBAN) 2 % Apply 1 application topically 2 (two) times daily. To rash 30 g 0  . nitroGLYCERIN (NITROSTAT) 0.4 MG SL tablet Place 1 tablet (0.4 mg total) under the tongue every 5 (five) minutes as needed for chest pain. 25 tablet 3  . oxyCODONE (OXYCONTIN) 60 MG 12 hr tablet Take 60 mg by mouth every 8 (eight) hours. 90 each 0  . Oxycodone HCl 10 MG TABS Take 1-2 tablets (10-20 mg total) by mouth  every 4 (four) hours as needed. 180 tablet 0  . pantoprazole (PROTONIX) 40 MG tablet TAKE 1 TABLET BY MOUTH ONCE DAILY. 30 tablet 1  . pravastatin (PRAVACHOL) 80 MG tablet Take 1 tablet (80 mg total) by mouth daily. 90 tablet 1   No current facility-administered medications for this visit.   Allergies:  Other, Zoledronic acid, and Pamidronate   Social History: The patient  reports that he quit smoking about 14 years ago. His smoking use included cigarettes. He has a 15.00 pack-year smoking history. He has never used smokeless tobacco. He reports that he does not drink alcohol and does not use drugs.   Family History: The patient's family history includes Alzheimer's disease in his mother; Colon cancer in his maternal uncle; Heart attack in his brother; Lung cancer in his father.   ROS:  Please see the history of present illness. Otherwise, complete review of systems is positive for {NONE DEFAULTED:18576::"none"}.  All other systems are reviewed and negative.   Physical Exam: VS:  There were no vitals taken for this visit., BMI There is no height or weight on file to calculate BMI.  Wt Readings from Last 3 Encounters:  09/01/19 185 lb (83.9 kg)  08/24/19 187 lb (84.8 kg)  08/18/19 187 lb 12.8 oz (85.2 kg)    General: Patient appears comfortable at rest. HEENT: Conjunctiva and lids normal, oropharynx clear with moist mucosa. Neck: Supple, no elevated JVP or carotid bruits, no thyromegaly. Lungs: Clear to auscultation, nonlabored breathing at rest. Cardiac: Regular rate and rhythm, no S3 or significant systolic murmur, no pericardial rub. Abdomen: Soft, nontender, no hepatomegaly, bowel sounds present, no guarding or rebound. Extremities: No pitting edema, distal pulses 2+. Skin: Warm and dry. Musculoskeletal: No kyphosis. Neuropsychiatric: Alert and oriented x3, affect grossly appropriate.  ECG:  {EKG/Telemetry Strips [phone]}  Recent Labwork: 05/12/2019: ALT 16; AST 22;  BUN 7; Creatinine, Ser 0.91; Potassium 4.5; Sodium 140     Component Value Date/Time   CHOL 144 05/12/2019 1418   TRIG 116 05/12/2019 1418   HDL 26 (L) 05/12/2019 1418   CHOLHDL 5.5 (H) 05/12/2019 1418   CHOLHDL 3.2 03/17/2014 1245   VLDL 30 03/17/2014 1245   [place] 97 05/12/2019 1418    Other Studies Reviewed Today: Echocardiogram 10/23/2012: Study Conclusions  - Left ventricle: The cavity size was normal. There was mild focal basal hypertrophy of the septum. Systolic function was normal. Wall motion was normal; there were no regional wall motion abnormalities. - Aortic valve: Mildly calcified annulus. Trileaflet; normal thickness leaflets. - Atrial septum: No defect or patent foramen ovale was identified. - Pulmonary arteries: PA peak pressure: 41mm Hg (S). Impressions:  - Compared to  the prior study of 06/28/10, there has been no significant interval change  Assessment and Plan:  1. CAD in native artery   2. Mixed hyperlipidemia   3. Essential hypertension   4. Tobacco abuse, in remission      Medication Adjustments/Labs and Tests Ordered: Current medicines are reviewed at length with the patient today.  Concerns regarding medicines are outlined above.   Disposition: Follow-up with ***  Signed, Levell July, NP 03/22/2020 9:20 AM    [facility], [place], [place] [address] Phone: [phone]; Fax: [phone]

## 2020-03-22 ENCOUNTER — Other Ambulatory Visit: Payer: Self-pay

## 2020-03-22 ENCOUNTER — Telehealth: Payer: Self-pay | Admitting: Family Medicine

## 2020-03-22 ENCOUNTER — Encounter: Payer: Self-pay | Admitting: Family Medicine

## 2020-03-22 ENCOUNTER — Ambulatory Visit: Payer: PPO | Admitting: Family Medicine

## 2020-03-22 ENCOUNTER — Telehealth (INDEPENDENT_AMBULATORY_CARE_PROVIDER_SITE_OTHER): Payer: PPO | Admitting: Family Medicine

## 2020-03-22 DIAGNOSIS — J449 Chronic obstructive pulmonary disease, unspecified: Secondary | ICD-10-CM | POA: Diagnosis not present

## 2020-03-22 DIAGNOSIS — I10 Essential (primary) hypertension: Secondary | ICD-10-CM

## 2020-03-22 DIAGNOSIS — J069 Acute upper respiratory infection, unspecified: Secondary | ICD-10-CM

## 2020-03-22 DIAGNOSIS — F17201 Nicotine dependence, unspecified, in remission: Secondary | ICD-10-CM

## 2020-03-22 DIAGNOSIS — I2510 Atherosclerotic heart disease of native coronary artery without angina pectoris: Secondary | ICD-10-CM

## 2020-03-22 DIAGNOSIS — E782 Mixed hyperlipidemia: Secondary | ICD-10-CM

## 2020-03-22 NOTE — Progress Notes (Signed)
Patient ID: [name] [name], male    DOB: [birth year], 64 y.o.   MRN: [id number]   Virtual Visit via Telephone Note  I connected with Gareth Eagle on 03/22/20 at  4:10 PM EDT by telephone and verified that I am speaking with the correct person using two identifiers.  Location: Patient: home Provider: office   I discussed the limitations, risks, security and privacy concerns of performing an evaluation and management service by telephone and the availability of in person appointments. I also discussed with the patient that there may be a patient responsible charge related to this service. The patient expressed understanding and agreed to proceed.   Chief Complaint  Patient presents with  . Fatigue   Subjective:    HPI   Nurses note- "Pt received flu shot on 03/12/21. Began to feel bad the next day. Chills, sweats, fatigued, no appetite, no fever. Was tested for COVID on 03/14/20 and it was negative. Pt has been on Augmentin and Lefloxacin. "  Pt had phone visit for URI and still feeling weak and dec appetite. Pt stating feeling slightly better this afternoon. Hasn't eaten in 2-3 days.  Not had an appetite.  No v.  Has some diarrhea. covid testing done- 8 days ago- negative.   Was given 875mg  Augmentin, but not improving, started 7 days ago. Then not working and was changed yesterday and started on levaquin 500mg  for URI. Taking 1 tab per day for 10 days.  Missed taking his pain pill this am also, and wondered if that was causing him to feel more sore and achiness. Oxycontin 60mg  bid and oxycodone 10mg .  After getting flu vaccine then started feeling bad, had it about 10 days ago. Next day started feeling bad. Had the high dose fluzone vaccine.  First time having the high dose.  Pt stating amoxicillin stating it doesn't work for him.  Using flonase. Eating chicken noodle soup daily, trying to get some food into his body.  But feeling dec appetite. Using albuterol  prn. Taking mucinex fast max.  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   Outpatient Encounter Medications as of 03/22/2020  Medication Sig  . albuterol (PROAIR HFA) 108 (90 Base) MCG/ACT inhaler inhale 1 puff every 4 hours WITH AEROCHAMBER  . ALPRAZolam (XANAX) 0.5 MG tablet TAKE 1 TABLET BY MOUTH FOUR TIMES DAILY AS NEEDED FOR ANXIETY  . aspirin EC 81 MG EC tablet Take 1 tablet (81 mg total) by mouth daily.  . calcipotriene (DOVONOX) 0.005 % cream Apply topically 2 (two) times daily.   . calcipotriene-betamethasone (TACLONEX) ointment Apply 1 application topically daily.  . Clobetasol Propionate (TEMOVATE) 0.05 % external spray APPLY EXTERNALLY TO THE AFFECTED AREA UP TO TWICE DAILY AS NEEDED  . Clobetasol Propionate 0.05 % shampoo USE SHAMPOO AS NEEDED  . Elastic Bandages & Supports (LUMBAR BACK BRACE/SUPPORT PAD) MISC 1 each by Does not apply route as directed.  . fluticasone (FLONASE) 50 MCG/ACT nasal spray Place 1 spray into both nostrils daily.  . mupirocin ointment (BACTROBAN) 2 % Apply 1 application topically 2 (two) times daily. To rash  . nitroGLYCERIN (NITROSTAT) 0.4 MG SL tablet Place 1 tablet (0.4 mg total)  under the tongue every 5 (five) minutes as needed for chest pain.  Marland Kitchen oxyCODONE (OXYCONTIN) 60 MG 12 hr tablet Take 60 mg by mouth every 8 (eight) hours.  . Oxycodone HCl 10 MG TABS Take 1-2 tablets (10-20 mg total) by mouth every 4 (four) hours as needed.  . pantoprazole (PROTONIX) 40 MG tablet TAKE 1 TABLET BY MOUTH ONCE DAILY.  .  pravastatin (PRAVACHOL) 80 MG tablet Take 1 tablet (80 mg total) by mouth daily.   No facility-administered encounter medications on file as of 03/22/2020.     Review of Systems  Constitutional: Positive for appetite change, chills and fatigue. Negative for fever.  HENT: Positive for congestion. Negative for rhinorrhea and sore throat.   Respiratory: Positive for cough. Negative for shortness of breath and wheezing.   Cardiovascular: Negative for chest pain and leg swelling.  Gastrointestinal: Negative for abdominal pain, diarrhea, nausea and vomiting.  Musculoskeletal: Positive for myalgias (improving).  Skin: Negative for rash.  Neurological: Negative for dizziness, weakness and headaches.     Vitals There were no vitals taken for this visit.  Objective:   Physical Exam No PE due to phone visit.  Assessment and Plan   1. URI, acute  2. Chronic obstructive pulmonary disease, unspecified COPD type ([place])   Has a h/o metastatic prostate cancer and seeing [name] and PA Sheldon Silvan.  Pt to finish checking the levaquin and has 10 days from his oncologist.    Follow Up Instructions:    I discussed the assessment and treatment plan with the patient. The patient was provided an opportunity to ask questions and all were answered. The patient agreed with the plan and demonstrated an understanding of the instructions.   The patient was advised to call back or seek an in-person evaluation if the symptoms worsen or if the condition fails to improve as anticipated.  I provided 20 minutes of non-face-to-face time during this encounter.

## 2020-03-22 NOTE — Telephone Encounter (Signed)
Pt having chills, sweats, no appetite, no fever and very fatigued. Pt was placed on Augmentin 875 by cancer doctor and then he called them to say it wasn't working so he is now on Levofloxacin 500 mg daily. Just started that. Pt states that he has been sick since we gave him flu shot on Saturday. Pt state he is not sure he can get up here but is willing to do a phone visit. Pt not sure if he needs labs or if it is his heart. Please advise. Thank you  [phone]

## 2020-03-22 NOTE — Telephone Encounter (Signed)
Pt given phone visit for today at 4:10

## 2020-03-22 NOTE — Telephone Encounter (Signed)
Mr. alaric, gladwin are scheduled for a virtual visit with your provider today.    Just as we do with appointments in the office, we must obtain your consent to participate.  Your consent will be active for this visit and any virtual visit you may have with one of our providers in the next 365 days.    If you have a MyChart account, I can also send a copy of this consent to you electronically.  All virtual visits are billed to your insurance company just like a traditional visit in the office.  As this is a virtual visit, video technology does not allow for your provider to perform a traditional examination.  This may limit your provider's ability to fully assess your condition.  If your provider identifies any concerns that need to be evaluated in person or the need to arrange testing such as labs, EKG, etc, we will make arrangements to do so.    Although advances in technology are sophisticated, we cannot ensure that it will always work on either your end or our end.  If the connection with a video visit is poor, we may have to switch to a telephone visit.  With either a video or telephone visit, we are not always able to ensure that we have a secure connection.   I need to obtain your verbal consent now.   Are you willing to proceed with your visit today?      Vicente Males, LPN [id number]  38:46 AM

## 2020-03-22 NOTE — Telephone Encounter (Signed)
Ok, give him phone visit. Dr. Lovena Le

## 2020-03-24 ENCOUNTER — Other Ambulatory Visit: Payer: Self-pay

## 2020-03-24 ENCOUNTER — Encounter: Payer: Self-pay | Admitting: Cardiology

## 2020-03-24 ENCOUNTER — Ambulatory Visit (INDEPENDENT_AMBULATORY_CARE_PROVIDER_SITE_OTHER): Payer: PPO | Admitting: Cardiology

## 2020-03-24 VITALS — BP 118/82 | HR 72 | Ht 69.0 in | Wt 172.0 lb

## 2020-03-24 DIAGNOSIS — I10 Essential (primary) hypertension: Secondary | ICD-10-CM | POA: Diagnosis not present

## 2020-03-24 DIAGNOSIS — E782 Mixed hyperlipidemia: Secondary | ICD-10-CM

## 2020-03-24 DIAGNOSIS — I25119 Atherosclerotic heart disease of native coronary artery with unspecified angina pectoris: Secondary | ICD-10-CM

## 2020-03-24 MED ORDER — ROSUVASTATIN CALCIUM 10 MG PO TABS
10.0000 mg | ORAL_TABLET | Freq: Every day | ORAL | 1 refills | Status: DC
Start: 2020-03-24 — End: 2020-09-27

## 2020-03-24 NOTE — Progress Notes (Signed)
Cardiology Office Note  Date: 03/24/2020   ID: CARR SHARTZER, DOB 09-12-1955, MRN [id number]  PCP:  Erven Colla, DO  Cardiologist:  Rozann Lesches, MD Electrophysiologist:  None   Chief Complaint  Patient presents with  . Cardiac follow-up    History of Present Illness: [name] [name] is a 64 y.o. male last seen in August 2020.  He presents for a routine visit.  From a cardiac perspective, he does not report any obvious angina symptoms, no recent nitroglycerin use.  He tells me that he had an influenza vaccine about 2 weeks ago.  States that he has not felt well in the last week or so, weak with anorexia, short of breath but no cough or fevers.  He states that he tested negative for COVID-19 in the interim, has already been vaccinated.  He was placed on a course of antibiotics by his oncologist.  Plans to see his PCP if symptoms do not improve in the next few days.  I personally reviewed his ECG today which shows normal sinus rhythm.  We went over his medications, I recommended that he started taking a coated baby aspirin at least every other day if not tolerated daily.  Also switching from Pravachol to Crestor 10 mg after his current prescription for better control of LDL.   Past Medical History:  Diagnosis Date  . Anxiety   . Asthma   . COPD (chronic obstructive pulmonary disease) ([place])   . Coronary artery disease    DES to LAD 1/12  . DDD (degenerative disc disease)   . Depression   . Drug reaction    Pamidronate - induced skin toxicity, Zometa - induced toxicity  . Essential hypertension   . Gastritis   . GERD (gastroesophageal reflux disease)   . Horseshoe kidney    Single kidney  . Internal hemorrhoids    Colonoscopy 1/12  . Myocardial infarction ([place])    2012  . Osteoarthritis   . 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 Prostate cancer ([place])    Metastatic, stage IV  - [name]  . Psoriasis   . Sigmoid diverticulosis   . Tubular adenoma     Colonoscopy 1/12    Past Surgical History:  Procedure Laterality Date  . ANTERIOR FUSION CERVICAL SPINE    . APPENDECTOMY    . COLONOSCOPY  08/24/2019  . ETHMOIDECTOMY Left 09/28/2013   Procedure: LEFT ENDOSCOPIC ANTERIOR ETHMOIDECTOMY;  Surgeon: [name], MD;  Location: [facility];  Service: ENT;  Laterality: Left;  . HEART STENTS  06/2010   2  . LEFT FOOT SURGERY    . MAXILLARY ANTROSTOMY Left 09/28/2013   Procedure: LEFT ENDOSCOPIC MAXILLARY ANTROSTOMY WITH REMOVAL OF TISSUE;  Surgeon: [name], MD;  Location: [facility];  Service: ENT;  Laterality: Left;  . POSTERIOR FUSION CERVICAL SPINE    . PROSTATECTOMY  02/28/2006   [name] in [place], [place].    Current Outpatient Medications  Medication Sig Dispense Refill  . albuterol (PROAIR HFA) 108 (90 Base) MCG/ACT inhaler inhale 1 puff every 4 hours WITH AEROCHAMBER 18 g 2  . ALPRAZolam (XANAX) 0.5 MG tablet TAKE 1 TABLET BY MOUTH FOUR TIMES DAILY AS NEEDED FOR ANXIETY 120 tablet 5  . aspirin EC 81 MG tablet Take 81 mg by mouth daily. Swallow whole.    . calcipotriene (DOVONOX) 0.005 % cream Apply topically 2 (two) times daily.     Marland Kitchen  calcipotriene-betamethasone (TACLONEX) ointment Apply 1 application topically daily. 60 g 3  . Clobetasol Propionate (TEMOVATE) 0.05 % external spray APPLY EXTERNALLY TO THE AFFECTED AREA UP TO TWICE DAILY AS NEEDED 59 mL 0  . Clobetasol Propionate 0.05 % shampoo USE SHAMPOO AS NEEDED 118 mL 11  . Elastic Bandages & Supports (LUMBAR BACK BRACE/SUPPORT PAD) MISC 1 each by Does not apply route as directed. 1 each 1  . fluticasone (FLONASE) 50 MCG/ACT nasal spray Place 1 spray into both nostrils daily. 16 g 2  . mupirocin ointment (BACTROBAN) 2 % Apply 1 application topically 2 (two) times daily. To rash 30 g 0  . nitroGLYCERIN (NITROSTAT) 0.4 MG SL tablet Place 1 tablet (0.4 mg total) under the tongue every 5 (five) minutes as needed for chest pain. 25 tablet 3  . oxyCODONE  (OXYCONTIN) 60 MG 12 hr tablet Take 60 mg by mouth every 8 (eight) hours. 90 each 0  . Oxycodone HCl 10 MG TABS Take 1-2 tablets (10-20 mg total) by mouth every 4 (four) hours as needed. 180 tablet 0  . pantoprazole (PROTONIX) 40 MG tablet TAKE 1 TABLET BY MOUTH ONCE DAILY. 30 tablet 1  . rosuvastatin (CRESTOR) 10 MG tablet Take 1 tablet (10 mg total) by mouth daily. 90 tablet 1   No current facility-administered medications for this visit.   Allergies:  Other, Zoledronic acid, and Pamidronate   ROS: No palpitations or syncope.  Physical Exam: VS:  BP 118/82   Pulse 72   Ht 5\' 9"  (1.753 m)   Wt 172 lb (78 kg)   SpO2 99%   BMI 25.40 kg/m , BMI Body mass index is 25.4 kg/m.  Wt Readings from Last 3 Encounters:  03/24/20 172 lb (78 kg)  09/01/19 185 lb (83.9 kg)  08/24/19 187 lb (84.8 kg)    General: Chronically ill-appearing male, no distress.   HEENT: Conjunctiva and lids normal, wearing a mask. Neck: Supple, no elevated JVP or carotid bruits, no thyromegaly. Lungs: Clear to auscultation, nonlabored breathing at rest. Cardiac: Regular rate and rhythm, no S3 or significant systolic murmur, no pericardial rub. Extremities: No pitting edema, distal pulses 2+.  ECG:  An ECG dated 01/27/2019 was personally reviewed today and demonstrated:  Sinus bradycardia.  Recent Labwork: 05/12/2019: ALT 16; AST 22; BUN 7; Creatinine, Ser 0.91; Potassium 4.5; Sodium 140     Component Value Date/Time   CHOL 144 05/12/2019 1418   TRIG 116 05/12/2019 1418   HDL 26 (L) 05/12/2019 1418   CHOLHDL 5.5 (H) 05/12/2019 1418   CHOLHDL 3.2 03/17/2014 1245   VLDL 30 03/17/2014 1245   [place] 97 05/12/2019 1418    Other Studies Reviewed Today:  Echocardiogram 10/23/2012: - Left ventricle: The cavity size was normal. There was mild  focal basal hypertrophy of the septum. Systolic function  was normal. Wall motion was normal; there were no regional  wall motion abnormalities.  - Aortic valve:  Mildly calcified annulus. Trileaflet; normal  thickness leaflets.  - Atrial septum: No defect or patent foramen ovale was  identified.  - Pulmonary arteries: PA peak pressure: 75mm Hg (S).  Impressions:   - Compared to the prior study of 06/28/10, there has been no  significant interval change.  Assessment and Plan:  1.  CAD with history of DES to the LAD in 2012.  ECG is normal today, he does not describe any recurring angina symptoms or nitroglycerin use.  Beta-blocker was discontinued due to bradycardia previously.  I asked  him to resume a coated baby aspirin at least every other day, also switching from Pravachol to Crestor 10 mg daily.  2.  Mixed hyperlipidemia, last LDL was 97.  Switch from Pravachol to Crestor 10 mg daily.  Follow-up FLP for his next visit.  3.  Essential hypertension by history, blood pressure is normal today.  Medication Adjustments/Labs and Tests Ordered: Current medicines are reviewed at length with the patient today.  Concerns regarding medicines are outlined above.   Tests Ordered: Orders Placed This Encounter  Procedures  . EKG 12-Lead    Medication Changes: Meds ordered this encounter  Medications  . rosuvastatin (CRESTOR) 10 MG tablet    Sig: Take 1 tablet (10 mg total) by mouth daily.    Dispense:  90 tablet    Refill:  1    NEW 03/24/2020    Disposition:  Follow up 6 months in the [place] office.  Signed, Satira Sark, MD, [facility] 03/24/2020 11:18 AM    [place] at [place], [place], [place] [address] Phone: [phone]; Fax: [phone]

## 2020-03-24 NOTE — Patient Instructions (Addendum)
Your physician wants you to follow-up in: [place] will receive a reminder letter in the mail two months in advance. If you don't receive a letter, please call our office to schedule the follow-up appointment.  Your physician has recommended you make the following change in your medication:   FINISH PRAVASTATIN  START CRESTOR 10 MG DAILY   START COATED ASPIRIN [address]   Your physician recommends that you return for lab work in: [place] FAST 6-8 HOURS PRIOR TO LAB WORK   Thank you for choosing [facility]!!

## 2020-04-06 ENCOUNTER — Telehealth: Payer: Self-pay | Admitting: Cardiology

## 2020-04-06 NOTE — Telephone Encounter (Signed)
Please give pt a call concerning his pantoprazole (PROTONIX) 40 MG tablet [[id number]]  -- he's having some problems and is unable to take it but he's still having heart burn  [phone]

## 2020-04-06 NOTE — Telephone Encounter (Signed)
Pt says he stopped taking pantoprazole a week ago says it has been making him SOB - since stopping SOB has resolved but it still having acid reflux

## 2020-04-06 NOTE — Telephone Encounter (Signed)
Pt aware and will try 20 mg of Protonix for now

## 2020-04-06 NOTE — Telephone Encounter (Signed)
I am not certain that Protonix would necessarily be making him short of breath, not a typical side effect.  He might try going back on half the dose, 20 mg daily.

## 2020-04-21 ENCOUNTER — Telehealth: Payer: Self-pay

## 2020-04-21 DIAGNOSIS — Z13220 Encounter for screening for lipoid disorders: Secondary | ICD-10-CM

## 2020-04-21 DIAGNOSIS — Z79899 Other long term (current) drug therapy: Secondary | ICD-10-CM

## 2020-04-21 DIAGNOSIS — Z125 Encounter for screening for malignant neoplasm of prostate: Secondary | ICD-10-CM

## 2020-04-21 NOTE — Telephone Encounter (Signed)
Phy on 12/27 need full blood work  Pt call back [phone]

## 2020-04-21 NOTE — Telephone Encounter (Signed)
Last labs 05/12/19:  Lipid, Liver, Met 7, PSA

## 2020-04-25 NOTE — Telephone Encounter (Signed)
Pls order those labs and add cbc. Thx. Dr. Lovena Le

## 2020-04-26 NOTE — Telephone Encounter (Signed)
Blood work ordered in Epic. Patient notified. 

## 2020-05-17 DIAGNOSIS — Z79899 Other long term (current) drug therapy: Secondary | ICD-10-CM | POA: Diagnosis not present

## 2020-05-17 DIAGNOSIS — Z13220 Encounter for screening for lipoid disorders: Secondary | ICD-10-CM | POA: Diagnosis not present

## 2020-05-17 DIAGNOSIS — Z125 Encounter for screening for malignant neoplasm of prostate: Secondary | ICD-10-CM | POA: Diagnosis not present

## 2020-05-18 LAB — LIPID PANEL
Chol/HDL Ratio: 4.3 ratio (ref 0.0–5.0)
Cholesterol, Total: 152 mg/dL (ref 100–199)
HDL: 35 mg/dL — ABNORMAL LOW (ref >39)
LDL Chol Calc (NIH): 94 mg/dL (ref 0–99)
Triglycerides: 129 mg/dL (ref 0–149)
VLDL Cholesterol Cal: 23 mg/dL (ref 5–40)

## 2020-05-18 LAB — HEPATIC FUNCTION PANEL
ALT: 10 IU/L (ref 0–44)
AST: 17 IU/L (ref 0–40)
Albumin: 4.6 g/dL (ref 3.8–4.8)
Alkaline Phosphatase: 73 IU/L (ref 44–121)
Bilirubin Total: 0.4 mg/dL (ref 0.0–1.2)
Bilirubin, Direct: 0.12 mg/dL (ref 0.00–0.40)
Total Protein: 6.6 g/dL (ref 6.0–8.5)

## 2020-05-18 LAB — BASIC METABOLIC PANEL
BUN/Creatinine Ratio: 6 — ABNORMAL LOW (ref 10–24)
BUN: 6 mg/dL — ABNORMAL LOW (ref 8–27)
CO2: 28 mmol/L (ref 20–29)
Calcium: 9.3 mg/dL (ref 8.6–10.2)
Chloride: 99 mmol/L (ref 96–106)
Creatinine, Ser: 0.99 mg/dL (ref 0.76–1.27)
GFR calc Af Amer: 91 mL/min/{1.73_m2} (ref >59)
GFR calc non Af Amer: 79 mL/min/{1.73_m2} (ref >59)
Glucose: 90 mg/dL (ref 65–99)
Potassium: 4.4 mmol/L (ref 3.5–5.2)
Sodium: 139 mmol/L (ref 134–144)

## 2020-05-18 LAB — PSA: Prostate Specific Ag, Serum: <0.1 ng/mL (ref 0.0–4.0)

## 2020-05-20 ENCOUNTER — Other Ambulatory Visit: Payer: Self-pay | Admitting: Family Medicine

## 2020-05-20 DIAGNOSIS — J449 Chronic obstructive pulmonary disease, unspecified: Secondary | ICD-10-CM

## 2020-05-30 ENCOUNTER — Ambulatory Visit (INDEPENDENT_AMBULATORY_CARE_PROVIDER_SITE_OTHER): Payer: PPO | Admitting: Family Medicine

## 2020-05-30 ENCOUNTER — Encounter: Payer: Self-pay | Admitting: Family Medicine

## 2020-05-30 ENCOUNTER — Other Ambulatory Visit: Payer: Self-pay

## 2020-05-30 VITALS — BP 124/86 | HR 72 | Temp 98.4°F | Wt 176.2 lb

## 2020-05-30 DIAGNOSIS — F419 Anxiety disorder, unspecified: Secondary | ICD-10-CM

## 2020-05-30 DIAGNOSIS — Z0000 Encounter for general adult medical examination without abnormal findings: Secondary | ICD-10-CM | POA: Diagnosis not present

## 2020-05-30 DIAGNOSIS — J449 Chronic obstructive pulmonary disease, unspecified: Secondary | ICD-10-CM | POA: Diagnosis not present

## 2020-05-30 MED ORDER — ALBUTEROL SULFATE HFA 108 (90 BASE) MCG/ACT IN AERS: INHALATION_SPRAY | RESPIRATORY_TRACT | 0 refills | Status: DC

## 2020-05-30 NOTE — Progress Notes (Signed)
Patient ID: [name] [name], male    DOB: 1955-12-09, 64 y.o.   MRN: [id number]   Chief Complaint  Patient presents with  . Annual Exam   Subjective:    HPI AWV- Annual Wellness Visit  The patient was seen for their annual wellness visit. The patient's past medical history, surgical history, and family history were reviewed. Pertinent vaccines were reviewed ( tetanus, pneumonia, shingles, flu) The patient's medication list was reviewed and updated.  The height and weight were entered.  BMI recorded in electronic record elsewhere  Cognitive screening was completed. Outcome of Mini - Cog: Pass   Falls /depression screening electronically recorded within record elsewhere  Recent listing of emergency department/hospitalizations over the past year were reviewed.  current specialist the patient sees on a regular basis: None   Medicare annual wellness visit patient questionnaire was reviewed.  A written screening schedule for the patient for the next 5-10 years was given. Appropriate discussion of followup regarding next visit was discussed.  Just saw the heart doctor and doing well.  Pt was feeling bad after the covid vaccine.  And feeling better know. Pt was given penicillin and then didn't help then switched to levaquin and feeling better.  Pt stating certain pain medications make him sleepy, but this combination doesn't make him sleepy.  Pt stating doesn't like diazepam and feeling too sleepy.  Seeing PA Keflas for xanax and pain medications from his oncologist. Pt taking oxycodone and oxycontin, from Oncology. Pt has chronic pain due to DJD, DDD, and developed osseous necrosis in jaw after taking Eligard (bisphosphonate therapy).    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   Outpatient Encounter Medications as of 05/30/2020  Medication Sig  . albuterol (VENTOLIN HFA) 108 (90 Base) MCG/ACT inhaler INHLAE 2 PUFF EVERY 2- 4 HOURS WITH AEROCHAMBER.  Marland Kitchen ALPRAZolam (XANAX) 0.5 MG tablet TAKE 1 TABLET BY MOUTH FOUR TIMES DAILY AS NEEDED FOR ANXIETY  . aspirin EC 81 MG tablet Take 81 mg by mouth daily. Swallow whole.  . calcipotriene (DOVONOX) 0.005 % cream Apply topically 2 (two) times daily.   . calcipotriene-betamethasone (TACLONEX) ointment Apply 1 application topically daily.  . Clobetasol Propionate (TEMOVATE) 0.05 % external spray APPLY EXTERNALLY TO THE AFFECTED AREA UP TO TWICE DAILY AS NEEDED  . Clobetasol Propionate 0.05 % shampoo USE SHAMPOO AS NEEDED  . Elastic Bandages & Supports (LUMBAR BACK BRACE/SUPPORT PAD) MISC 1 each by Does not apply route as directed.  . fluticasone (FLONASE) 50 MCG/ACT nasal spray Place 1 spray into both nostrils daily.  . mupirocin ointment (BACTROBAN) 2 % Apply 1 application topically 2 (two) times daily. To rash  . nitroGLYCERIN (NITROSTAT) 0.4 MG SL tablet Place 1 tablet (0.4 mg total) under the tongue every 5 (five) minutes as needed for chest pain.  Marland Kitchen oxyCODONE (OXYCONTIN) 60 MG 12 hr tablet Take 60 mg by mouth every 8 (eight) hours.  . Oxycodone HCl 10 MG TABS Take 1-2 tablets (10-20 mg total) by mouth every 4 (four) hours as needed.  . pantoprazole (PROTONIX) 20 MG tablet Take 20 mg by mouth daily.  . rosuvastatin (CRESTOR) 10 MG tablet Take 1 tablet (10 mg total) by  mouth daily.  . [DISCONTINUED] albuterol (VENTOLIN HFA) 108 (90 Base) MCG/ACT inhaler INHLAE 1 PUFF EVERY 4 HOURS WITH AEROCHAMBER.   No facility-administered encounter medications on file as of 05/30/2020.     Review of  Systems  Constitutional: Negative for chills and fever.  HENT: Negative for congestion, rhinorrhea and sore throat.   Respiratory: Negative for cough, shortness of breath and wheezing.   Cardiovascular: Negative for chest pain and leg swelling.  Gastrointestinal: Negative for abdominal pain, diarrhea, nausea and vomiting.  Genitourinary: Negative for dysuria and frequency.  Musculoskeletal:       +chronic pain syndrome   Skin: Negative for rash.  Neurological: Negative for dizziness, weakness and headaches.     Vitals BP 124/86   Pulse 72   Temp 98.4 F (36.9 C)   Wt 176 lb 3.2 oz (79.9 kg)   SpO2 97%   BMI 26.02 kg/m   Objective:   Physical Exam Vitals and nursing note reviewed.  Constitutional:      General: He is not in acute distress.    Appearance: Normal appearance. He is not ill-appearing.  HENT:     Head: Normocephalic.     Nose: Nose normal. No congestion.     Mouth/Throat:     Mouth: Mucous membranes are moist.     Pharynx: No oropharyngeal exudate.  Eyes:     Extraocular Movements: Extraocular movements intact.     Conjunctiva/sclera: Conjunctivae normal.     Pupils: Pupils are equal, round, and reactive to light.  Cardiovascular:     Rate and Rhythm: Normal rate and regular rhythm.     Pulses: Normal pulses.     Heart sounds: Normal heart sounds. No murmur heard.   Pulmonary:     Effort: Pulmonary effort is normal.     Breath sounds: Normal breath sounds. No wheezing, rhonchi or rales.  Musculoskeletal:        General: Normal range of motion.     Right lower leg: No edema.     Left lower leg: No edema.  Skin:    General: Skin is warm and dry.     Findings: No rash.  Neurological:     General: No focal deficit present.     Mental Status: He is alert and oriented to person, place, and time.     Cranial Nerves: No cranial nerve deficit.  Psychiatric:        Mood and Affect: Mood normal.        Behavior: Behavior normal.        Thought Content:  Thought content normal.        Judgment: Judgment normal.     Assessment and Plan   1. Medicare annual wellness visit, subsequent  2. Chronic obstructive pulmonary disease, unspecified COPD type (HCC) - albuterol (VENTOLIN HFA) 108 (90 Base) MCG/ACT inhaler; INHLAE 2 PUFF EVERY 2- 4 HOURS WITH AEROCHAMBER.  Dispense: 8.5 g; Refill: 0  3. Anxiety   HM- PCV 13 due.  Copd- stable. Cont meds.  Anxiety- cont medications from his Oncologist, [place], [place], who is prescribing Xanax 0.5mg  qid. Prn.  Copd-stable- cont albuterol and flonase.  Chronic pain and history of metastatic prostate cancer- cont to see pain management for this, seeing PA Kefalas, at oncology office.  Seeing cardiology for CAD.  F/u 78mo or prn.

## 2020-06-06 DIAGNOSIS — J310 Chronic rhinitis: Secondary | ICD-10-CM | POA: Diagnosis not present

## 2020-06-06 DIAGNOSIS — H6983 Other specified disorders of Eustachian tube, bilateral: Secondary | ICD-10-CM | POA: Diagnosis not present

## 2020-06-06 DIAGNOSIS — H903 Sensorineural hearing loss, bilateral: Secondary | ICD-10-CM | POA: Diagnosis not present

## 2020-06-06 DIAGNOSIS — J343 Hypertrophy of nasal turbinates: Secondary | ICD-10-CM | POA: Diagnosis not present

## 2020-06-06 DIAGNOSIS — J342 Deviated nasal septum: Secondary | ICD-10-CM | POA: Diagnosis not present

## 2020-07-12 DIAGNOSIS — F419 Anxiety disorder, unspecified: Secondary | ICD-10-CM | POA: Diagnosis not present

## 2020-07-12 DIAGNOSIS — C772 Secondary and unspecified malignant neoplasm of intra-abdominal lymph nodes: Secondary | ICD-10-CM | POA: Diagnosis not present

## 2020-07-12 DIAGNOSIS — C61 Malignant neoplasm of prostate: Secondary | ICD-10-CM | POA: Diagnosis not present

## 2020-07-12 DIAGNOSIS — Z87891 Personal history of nicotine dependence: Secondary | ICD-10-CM | POA: Diagnosis not present

## 2020-07-12 DIAGNOSIS — M4725 Other spondylosis with radiculopathy, thoracolumbar region: Secondary | ICD-10-CM | POA: Diagnosis not present

## 2020-07-12 DIAGNOSIS — J441 Chronic obstructive pulmonary disease with (acute) exacerbation: Secondary | ICD-10-CM | POA: Diagnosis not present

## 2020-07-12 DIAGNOSIS — M810 Age-related osteoporosis without current pathological fracture: Secondary | ICD-10-CM | POA: Diagnosis not present

## 2020-07-12 DIAGNOSIS — G894 Chronic pain syndrome: Secondary | ICD-10-CM | POA: Diagnosis not present

## 2020-07-12 DIAGNOSIS — M8710 Osteonecrosis due to drugs, unspecified bone: Secondary | ICD-10-CM | POA: Diagnosis not present

## 2020-08-08 ENCOUNTER — Other Ambulatory Visit: Payer: Self-pay | Admitting: Cardiology

## 2020-08-10 DIAGNOSIS — H9042 Sensorineural hearing loss, unilateral, left ear, with unrestricted hearing on the contralateral side: Secondary | ICD-10-CM | POA: Diagnosis not present

## 2020-08-10 DIAGNOSIS — H9312 Tinnitus, left ear: Secondary | ICD-10-CM | POA: Diagnosis not present

## 2020-08-12 ENCOUNTER — Telehealth: Payer: Self-pay | Admitting: Cardiology

## 2020-08-12 NOTE — Telephone Encounter (Signed)
Pt c/o of Chest Pain: 1. Are you having CP right now? Some  2. Are you experiencing any other symptoms (ex. SOB, nausea, vomiting, sweating)? No 3. How long have you been experiencing CP? Approximately a week ago  4. Is your CP continuous or coming and going? Comes and goes.  5. Have you taken Nitroglycerin? No   Patient states that for a week now he is having heaviness on his heart.

## 2020-08-12 NOTE — Telephone Encounter (Signed)
Pt c/o mild chest pain and pressure for several days lasting around an hour at a time - denies active chest pain - has had about 3 episodes in the last week - denies dizziness/SOB/palpitations/swelling weight gain - doesn't know what HR/BP has been - hasn't taken any NTG - pt aware to monitor symptoms and if return/worsen to have ED evaluation - has upcoming appt with Dr Domenic Polite in April

## 2020-08-24 ENCOUNTER — Ambulatory Visit (INDEPENDENT_AMBULATORY_CARE_PROVIDER_SITE_OTHER): Payer: PPO | Admitting: Family Medicine

## 2020-08-24 ENCOUNTER — Other Ambulatory Visit: Payer: Self-pay

## 2020-08-24 ENCOUNTER — Encounter: Payer: Self-pay | Admitting: Family Medicine

## 2020-08-24 VITALS — HR 69 | Temp 98.7°F | Resp 20

## 2020-08-24 DIAGNOSIS — R058 Other specified cough: Secondary | ICD-10-CM | POA: Diagnosis not present

## 2020-08-24 DIAGNOSIS — J441 Chronic obstructive pulmonary disease with (acute) exacerbation: Secondary | ICD-10-CM

## 2020-08-24 DIAGNOSIS — J45901 Unspecified asthma with (acute) exacerbation: Secondary | ICD-10-CM | POA: Diagnosis not present

## 2020-08-24 DIAGNOSIS — R059 Cough, unspecified: Secondary | ICD-10-CM | POA: Insufficient documentation

## 2020-08-24 MED ORDER — LEVOFLOXACIN 500 MG PO TABS: 500.0000 mg | ORAL_TABLET | Freq: Every day | ORAL | 0 refills | Status: DC

## 2020-08-24 NOTE — Patient Instructions (Signed)

## 2020-08-24 NOTE — Progress Notes (Signed)
Patient ID: [name] [name], male    DOB: [birth year], 65 y.o.   MRN: [id number]   Chief Complaint  Patient presents with  . Cough    Congestion- coughing up white mucus Covid test was negative   Subjective:  CC: cough and congestion, has asthma and COPD   Patient states he normally given Levaquin by oncologist and Amoxil and such doesn't help him This is a new problem.  Presents today for an acute visit with cough, congestion with productive sputum, shortness of breath with exertion.  Symptoms started Sunday.  Has a history of asthma and COPD, quit smoking in 2007.  Has been using his albuterol inhaler for symptoms.  Denies fever.  Endorses chills, congestion, cough, and shortness of breath with exertion.    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   Outpatient Encounter Medications as of 08/24/2020  Medication Sig  . levofloxacin (LEVAQUIN) 500 MG tablet Take 1 tablet (500 mg total) by mouth daily.  Marland Kitchen albuterol (VENTOLIN HFA) 108 (90 Base) MCG/ACT inhaler INHLAE 2 PUFF EVERY 2- 4 HOURS WITH AEROCHAMBER.  Marland Kitchen ALPRAZolam (XANAX) 0.5 MG tablet TAKE 1 TABLET BY MOUTH FOUR TIMES DAILY AS NEEDED FOR ANXIETY  . aspirin EC 81 MG tablet Take 81 mg by mouth daily. Swallow whole.  Water engineer Bandages & Supports (LUMBAR BACK BRACE/SUPPORT PAD) MISC 1 each by Does not apply route as directed.  . fluticasone (FLONASE) 50 MCG/ACT nasal spray Place 1 spray into both nostrils daily.  . nitroGLYCERIN (NITROSTAT) 0.4 MG SL tablet Place 1 tablet (0.4 mg total) under the  tongue every 5 (five) minutes as needed for chest pain.  Marland Kitchen oxyCODONE (OXYCONTIN) 60 MG 12 hr tablet Take 60 mg by mouth every 8 (eight) hours.  . Oxycodone HCl 10 MG TABS Take 1-2 tablets (10-20 mg total) by mouth every 4 (four) hours as needed.  . pantoprazole (PROTONIX) 20 MG tablet Take 20 mg by mouth daily.  . pantoprazole (PROTONIX) 40 MG tablet TAKE 1 TABLET BY MOUTH ONCE DAILY.  . rosuvastatin (CRESTOR) 10 MG tablet Take 1 tablet (10 mg total) by mouth daily.   No facility-administered encounter medications on file as of 08/24/2020.     Review of Systems  Constitutional: Positive for chills. Negative for fever.  HENT: Positive for congestion.   Respiratory: Positive for cough and shortness of breath.        With exertion.   Cardiovascular: Negative for chest pain.  Gastrointestinal: Negative for abdominal pain.  Neurological: Positive for headaches.     Vitals Pulse 69   Temp 98.7 F (37.1 C)   Resp 20   SpO2 95%   Objective:   Physical Exam Vitals reviewed.  Cardiovascular:     Rate and Rhythm: Normal rate and regular rhythm.     Heart sounds: Normal heart sounds.  Pulmonary:     Effort: Pulmonary effort is normal.     Breath sounds: Wheezing present.     Comments: Expiratory wheezing.  Skin:    General: Skin is warm and dry.  Neurological:     General: No focal deficit present.     Mental Status: He is alert.  Psychiatric:  Behavior: Behavior normal.      Assessment and Plan   1. Asthma with COPD with exacerbation ([place]) - levofloxacin (LEVAQUIN) 500 MG tablet; Take 1 tablet (500 mg total) by mouth daily.  Dispense: 7 tablet; Refill: 0  2. Cough with sputum   Due to history of COPD, will have a low threshold to treat with antibiotics.  Levofloxacin for 7 days sent to pharmacy.  Oxygen saturation 95% in office today.  Lungs with expiratory wheezing.  He will continue using his inhalers as prescribed, start his antibiotic today.  Agrees with plan  of care discussed today. Understands warning signs to seek further care: chest pain, shortness of breath, any significant change in health.  Understands to follow-up in 2 to 3 days if symptoms are not improving, or worsen.  Will consider chest x-ray at that time.  Reports that he usually responds to levofloxacin well.  [name] Ades, NP 08/24/20

## 2020-08-26 ENCOUNTER — Other Ambulatory Visit: Payer: Self-pay | Admitting: Family Medicine

## 2020-08-26 ENCOUNTER — Telehealth: Payer: Self-pay

## 2020-08-26 DIAGNOSIS — J449 Chronic obstructive pulmonary disease, unspecified: Secondary | ICD-10-CM

## 2020-08-26 MED ORDER — FLUTICASONE PROPIONATE 50 MCG/ACT NA SUSP: 1.0000 | Freq: Every day | NASAL | 1 refills | Status: DC

## 2020-08-26 MED ORDER — ALBUTEROL SULFATE HFA 108 (90 BASE) MCG/ACT IN AERS: INHALATION_SPRAY | RESPIRATORY_TRACT | 1 refills | Status: DC

## 2020-08-26 NOTE — Telephone Encounter (Signed)
Patient is requesting refill on albuterol -ventolin HFA with 3 puffs and Fluticasone 50 MCG called in to [facility] . Patient was seen on 3/23 for asthma /COPD. Please advise

## 2020-09-01 ENCOUNTER — Telehealth: Payer: Self-pay

## 2020-09-01 NOTE — Telephone Encounter (Signed)
Patient was seen 3/23 with cough and congestion. He finished up medication but still has cough and congested in his chest. Please advise [employer]

## 2020-09-01 NOTE — Telephone Encounter (Signed)
[name] [name] wants to know what [name] [name] can get over the counter [name] [name] has a lot of copngestion [name] [name] is a cancer patient and wants to know what is safe. [name] [name] was seen last week pt still has congestion from that and not improved.   Pt call back [phone]

## 2020-09-02 NOTE — Telephone Encounter (Signed)
My advice is to ask the pharmacist what would be best for his current  health conditions and symptoms. KD

## 2020-09-02 NOTE — Telephone Encounter (Signed)
Patient notified and stated he will contact his oncologist for recommendations on what is safe given his cancer treatment

## 2020-09-12 ENCOUNTER — Other Ambulatory Visit: Payer: Self-pay | Admitting: Family Medicine

## 2020-09-12 ENCOUNTER — Telehealth: Payer: Self-pay | Admitting: Family Medicine

## 2020-09-12 DIAGNOSIS — R059 Cough, unspecified: Secondary | ICD-10-CM

## 2020-09-12 MED ORDER — GUAIFENESIN-CODEINE 100-10 MG/5ML PO SOLN: 5.0000 mL | Freq: Three times a day (TID) | ORAL | 0 refills | Status: DC | PRN

## 2020-09-12 NOTE — Telephone Encounter (Signed)
Patient is states still has a cough and congestion. He has cough so much that his chest hurts and stomach pain. [employer]

## 2020-09-12 NOTE — Progress Notes (Signed)
persistent cough.

## 2020-09-13 ENCOUNTER — Telehealth: Payer: Self-pay | Admitting: *Deleted

## 2020-09-13 ENCOUNTER — Telehealth: Payer: Self-pay

## 2020-09-13 ENCOUNTER — Other Ambulatory Visit: Payer: Self-pay | Admitting: Family Medicine

## 2020-09-13 DIAGNOSIS — R059 Cough, unspecified: Secondary | ICD-10-CM

## 2020-09-13 MED ORDER — BENZONATATE 100 MG PO CAPS: 100.0000 mg | ORAL_CAPSULE | Freq: Two times a day (BID) | ORAL | 0 refills | Status: DC | PRN

## 2020-09-13 NOTE — Telephone Encounter (Signed)
Discussed with pt. Pt verbalized understanding.  °

## 2020-09-13 NOTE — Telephone Encounter (Signed)
Pt states he thinks he may have pneumonia and having some wheezing. Wanted to know if cxr could be ordered for him. No available appts today. Advised him to go to urgent care. Pt verbalized understanding.

## 2020-09-13 NOTE — Progress Notes (Signed)
Cough

## 2020-09-13 NOTE — Telephone Encounter (Signed)
Santiago Glad sent guaiFENesin-codeine 100-10 MG/5ML syrup  For Cough and Pt is wanting to know if this is going to interfere with the oxyCODONE (OXYCONTIN) 60 MG 12 hr tablet,  Oxycodone HCl 10 MG TABS he is a cancer pt   Pt call back [phone]

## 2020-09-13 NOTE — Telephone Encounter (Signed)
It can cause drowsiness to use both together. The cough medicine I wanted to use was not covered by his insurance.  Will send Perles.

## 2020-09-22 ENCOUNTER — Other Ambulatory Visit: Payer: Self-pay

## 2020-09-22 ENCOUNTER — Ambulatory Visit: Payer: PPO | Admitting: Cardiology

## 2020-09-22 ENCOUNTER — Encounter: Payer: Self-pay | Admitting: Cardiology

## 2020-09-22 VITALS — BP 120/70 | HR 71 | Ht 69.0 in | Wt 166.0 lb

## 2020-09-22 DIAGNOSIS — I25119 Atherosclerotic heart disease of native coronary artery with unspecified angina pectoris: Secondary | ICD-10-CM | POA: Diagnosis not present

## 2020-09-22 DIAGNOSIS — I10 Essential (primary) hypertension: Secondary | ICD-10-CM | POA: Diagnosis not present

## 2020-09-22 DIAGNOSIS — E782 Mixed hyperlipidemia: Secondary | ICD-10-CM | POA: Diagnosis not present

## 2020-09-22 NOTE — Patient Instructions (Addendum)

## 2020-09-22 NOTE — Progress Notes (Signed)
Cardiology Office Note  Date: 09/22/2020   ID: [name] [name], [name] [name] 06/21/1955, MRN [id number]  PCP:  Erven Colla, DO  Cardiologist:  Rozann Lesches, MD Electrophysiologist:  None   Chief Complaint  Patient presents with  . Cardiac follow-up    History of Present Illness: [name] [name] is a 65 y.o. male last seen in October 2021.  He is here for a routine follow-up visit.  Reports no recent angina symptoms or nitroglycerin use.  He has been having a lot of trouble with pollen season, allergy symptoms with intermittent cough.  I reviewed his medications which are outlined below.  He has been tolerating Crestor, reports compliance with aspirin as well.  I reviewed his interval lab work as noted below.  Past Medical History:  Diagnosis Date  . Anxiety   . Asthma   . COPD (chronic obstructive pulmonary disease) ([place])   . Coronary artery disease    DES to LAD 1/12  . DDD (degenerative disc disease)   . Depression   . Drug reaction    Pamidronate - induced skin toxicity, Zometa - induced toxicity  . Essential hypertension   . Gastritis   . GERD (gastroesophageal reflux disease)   . Horseshoe kidney    Single kidney  . Internal hemorrhoids    Colonoscopy 1/12  . Myocardial infarction ([place])    2012  . Osteoarthritis   . 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 Prostate cancer ([place])    Metastatic, stage IV  - [name]  . Psoriasis   . Sigmoid diverticulosis   . Tubular adenoma    Colonoscopy 1/12    Past Surgical History:  Procedure Laterality Date  . ANTERIOR FUSION CERVICAL SPINE    . APPENDECTOMY    . COLONOSCOPY  08/24/2019  . ETHMOIDECTOMY Left 09/28/2013   Procedure: LEFT ENDOSCOPIC ANTERIOR ETHMOIDECTOMY;  Surgeon: [name], MD;  Location: [facility];  Service: ENT;  Laterality: Left;  . HEART STENTS  06/2010   2  . LEFT FOOT SURGERY    . MAXILLARY ANTROSTOMY Left 09/28/2013   Procedure: LEFT ENDOSCOPIC MAXILLARY ANTROSTOMY  WITH REMOVAL OF TISSUE;  Surgeon: [name], MD;  Location: [facility];  Service: ENT;  Laterality: Left;  . POSTERIOR FUSION CERVICAL SPINE    . PROSTATECTOMY  02/28/2006   [name] in [place], [place].    Current Outpatient Medications  Medication Sig Dispense Refill  . albuterol (VENTOLIN HFA) 108 (90 Base) MCG/ACT inhaler INHLAE 2 PUFF EVERY 2- 4 HOURS WITH AEROCHAMBER. 8.5 g 1  . ALPRAZolam (XANAX) 0.5 MG tablet TAKE 1 TABLET BY MOUTH FOUR TIMES DAILY AS NEEDED FOR ANXIETY 120 tablet 5  . aspirin EC 81 MG tablet Take 81 mg by mouth daily. Swallow whole.    . benzonatate (TESSALON) 100 MG capsule Take 1 capsule (100 mg total) by mouth 2 (two) times daily as needed for cough. 20 capsule 0  . Elastic Bandages & Supports (LUMBAR BACK BRACE/SUPPORT PAD) MISC 1 each by Does not apply route as directed. 1 each 1  . fluticasone (FLONASE) 50 MCG/ACT nasal spray Place 1 spray into both nostrils daily. 16 g 1  . nitroGLYCERIN (NITROSTAT) 0.4 MG SL tablet Place 1 tablet (0.4 mg total) under the tongue every 5 (five) minutes as needed for chest pain. 25 tablet 3  . oxyCODONE (OXYCONTIN) 60 MG 12 hr tablet Take 60 mg by mouth every 8 (  eight) hours. 90 each 0  . Oxycodone HCl 10 MG TABS Take 1-2 tablets (10-20 mg total) by mouth every 4 (four) hours as needed. 180 tablet 0  . pantoprazole (PROTONIX) 20 MG tablet Take 20 mg by mouth daily.    . pantoprazole (PROTONIX) 40 MG tablet TAKE 1 TABLET BY MOUTH ONCE DAILY. 90 tablet 1  . rosuvastatin (CRESTOR) 10 MG tablet Take 1 tablet (10 mg total) by mouth daily. 90 tablet 1   No current facility-administered medications for this visit.   Allergies:  Other, Zoledronic acid, and Pamidronate   ROS: No syncope.  Physical Exam: VS:  BP 120/70   Pulse 71   Ht 5\' 9"  (1.753 m)   Wt 166 lb (75.3 kg)   SpO2 96%   BMI 24.51 kg/m , BMI Body mass index is 24.51 kg/m.  Wt Readings from Last 3 Encounters:  09/22/20 166 lb (75.3 kg)  05/30/20  176 lb 3.2 oz (79.9 kg)  03/24/20 172 lb (78 kg)    General: Patient appears comfortable at rest. HEENT: Conjunctiva and lids normal, wearing a mask. Neck: Supple, no elevated JVP or carotid bruits, no thyromegaly. Lungs: Clear to auscultation, nonlabored breathing at rest. Cardiac: Regular rate and rhythm, no S3 or significant systolic murmur, no pericardial rub. Extremities: No pitting edema.  ECG:  An ECG dated 03/24/2020 was personally reviewed today and demonstrated:  Sinus rhythm.  Recent Labwork: 05/17/2020: ALT 10; AST 17; BUN 6; Creatinine, Ser 0.99; Potassium 4.4; Sodium 139     Component Value Date/Time   CHOL 152 05/17/2020 1331   TRIG 129 05/17/2020 1331   HDL 35 (L) 05/17/2020 1331   CHOLHDL 4.3 05/17/2020 1331   CHOLHDL 3.2 03/17/2014 1245   VLDL 30 03/17/2014 1245   [place] 94 05/17/2020 1331    Other Studies Reviewed Today:  Echocardiogram 10/23/2012: - Left ventricle: The cavity size was normal. There was mild  focal basal hypertrophy of the septum. Systolic function  was normal. Wall motion was normal; there were no regional  wall motion abnormalities.  - Aortic valve: Mildly calcified annulus. Trileaflet; normal  thickness leaflets.  - Atrial septum: No defect or patent foramen ovale was  identified.  - Pulmonary arteries: PA peak pressure: 66mm Hg (S).  Impressions:   - Compared to the prior study of 06/28/10, there has been no  significant interval change.  Assessment and Plan:  1.  CAD status post DES to the LAD in 2012.  He does not describe any active angina at this time and reports compliance with medications.  Continue aspirin, Crestor, and as needed nitroglycerin.  2.  Mixed hyperlipidemia, tolerating switch from Pravachol to Crestor.  Most recent LDL down to 94.  Continue with further up titration over time.  3.  Mixed hyperlipidemia, blood pressure is well controlled today.  Medication Adjustments/Labs and Tests  Ordered: Current medicines are reviewed at length with the patient today.  Concerns regarding medicines are outlined above.   Tests Ordered: No orders of the defined types were placed in this encounter.   Medication Changes: No orders of the defined types were placed in this encounter.   Disposition:  Follow up 6 months.  Signed, Satira Sark, MD, [facility] 09/22/2020 3:02 PM    [facility] [place], [place], [place] [address] Phone: [phone]; Fax: [phone]

## 2020-09-27 ENCOUNTER — Other Ambulatory Visit: Payer: Self-pay | Admitting: Cardiology

## 2020-11-10 DIAGNOSIS — G894 Chronic pain syndrome: Secondary | ICD-10-CM | POA: Diagnosis not present

## 2020-11-10 DIAGNOSIS — C61 Malignant neoplasm of prostate: Secondary | ICD-10-CM | POA: Diagnosis not present

## 2020-11-10 DIAGNOSIS — C772 Secondary and unspecified malignant neoplasm of intra-abdominal lymph nodes: Secondary | ICD-10-CM | POA: Diagnosis not present

## 2020-11-10 DIAGNOSIS — M8710 Osteonecrosis due to drugs, unspecified bone: Secondary | ICD-10-CM | POA: Diagnosis not present

## 2020-12-12 DIAGNOSIS — M8710 Osteonecrosis due to drugs, unspecified bone: Secondary | ICD-10-CM | POA: Diagnosis not present

## 2020-12-12 DIAGNOSIS — C61 Malignant neoplasm of prostate: Secondary | ICD-10-CM | POA: Diagnosis not present

## 2020-12-12 DIAGNOSIS — N451 Epididymitis: Secondary | ICD-10-CM | POA: Diagnosis not present

## 2020-12-12 DIAGNOSIS — N50811 Right testicular pain: Secondary | ICD-10-CM | POA: Diagnosis not present

## 2020-12-12 DIAGNOSIS — N433 Hydrocele, unspecified: Secondary | ICD-10-CM | POA: Diagnosis not present

## 2020-12-12 DIAGNOSIS — G894 Chronic pain syndrome: Secondary | ICD-10-CM | POA: Diagnosis not present

## 2020-12-12 DIAGNOSIS — K4090 Unilateral inguinal hernia, without obstruction or gangrene, not specified as recurrent: Secondary | ICD-10-CM | POA: Diagnosis not present

## 2020-12-12 DIAGNOSIS — M4725 Other spondylosis with radiculopathy, thoracolumbar region: Secondary | ICD-10-CM | POA: Diagnosis not present

## 2020-12-12 DIAGNOSIS — C772 Secondary and unspecified malignant neoplasm of intra-abdominal lymph nodes: Secondary | ICD-10-CM | POA: Diagnosis not present

## 2020-12-26 DIAGNOSIS — C772 Secondary and unspecified malignant neoplasm of intra-abdominal lymph nodes: Secondary | ICD-10-CM | POA: Diagnosis not present

## 2020-12-26 DIAGNOSIS — C61 Malignant neoplasm of prostate: Secondary | ICD-10-CM | POA: Diagnosis not present

## 2020-12-26 DIAGNOSIS — N50811 Right testicular pain: Secondary | ICD-10-CM | POA: Diagnosis not present

## 2020-12-26 DIAGNOSIS — R309 Painful micturition, unspecified: Secondary | ICD-10-CM | POA: Diagnosis not present

## 2020-12-26 DIAGNOSIS — R1031 Right lower quadrant pain: Secondary | ICD-10-CM | POA: Diagnosis not present

## 2020-12-30 DIAGNOSIS — N50811 Right testicular pain: Secondary | ICD-10-CM | POA: Diagnosis not present

## 2020-12-30 DIAGNOSIS — R1031 Right lower quadrant pain: Secondary | ICD-10-CM | POA: Diagnosis not present

## 2020-12-30 DIAGNOSIS — Q631 Lobulated, fused and horseshoe kidney: Secondary | ICD-10-CM | POA: Diagnosis not present

## 2020-12-30 DIAGNOSIS — R161 Splenomegaly, not elsewhere classified: Secondary | ICD-10-CM | POA: Diagnosis not present

## 2020-12-30 DIAGNOSIS — C61 Malignant neoplasm of prostate: Secondary | ICD-10-CM | POA: Diagnosis not present

## 2020-12-30 DIAGNOSIS — C772 Secondary and unspecified malignant neoplasm of intra-abdominal lymph nodes: Secondary | ICD-10-CM | POA: Diagnosis not present

## 2021-04-03 DIAGNOSIS — C61 Malignant neoplasm of prostate: Secondary | ICD-10-CM | POA: Diagnosis not present

## 2021-04-03 DIAGNOSIS — M4725 Other spondylosis with radiculopathy, thoracolumbar region: Secondary | ICD-10-CM | POA: Diagnosis not present

## 2021-04-03 DIAGNOSIS — G894 Chronic pain syndrome: Secondary | ICD-10-CM | POA: Diagnosis not present

## 2021-04-03 DIAGNOSIS — I25119 Atherosclerotic heart disease of native coronary artery with unspecified angina pectoris: Secondary | ICD-10-CM | POA: Diagnosis not present

## 2021-04-03 DIAGNOSIS — J441 Chronic obstructive pulmonary disease with (acute) exacerbation: Secondary | ICD-10-CM | POA: Diagnosis not present

## 2021-04-03 DIAGNOSIS — M8710 Osteonecrosis due to drugs, unspecified bone: Secondary | ICD-10-CM | POA: Diagnosis not present

## 2021-04-03 DIAGNOSIS — F419 Anxiety disorder, unspecified: Secondary | ICD-10-CM | POA: Diagnosis not present

## 2021-04-03 DIAGNOSIS — C772 Secondary and unspecified malignant neoplasm of intra-abdominal lymph nodes: Secondary | ICD-10-CM | POA: Diagnosis not present

## 2021-04-03 DIAGNOSIS — M810 Age-related osteoporosis without current pathological fracture: Secondary | ICD-10-CM | POA: Diagnosis not present

## 2021-04-26 DIAGNOSIS — H903 Sensorineural hearing loss, bilateral: Secondary | ICD-10-CM | POA: Diagnosis not present

## 2021-04-26 DIAGNOSIS — H9313 Tinnitus, bilateral: Secondary | ICD-10-CM | POA: Diagnosis not present

## 2021-05-04 ENCOUNTER — Other Ambulatory Visit: Payer: Self-pay | Admitting: Family Medicine

## 2021-05-04 DIAGNOSIS — J449 Chronic obstructive pulmonary disease, unspecified: Secondary | ICD-10-CM

## 2021-05-12 ENCOUNTER — Other Ambulatory Visit: Payer: Self-pay | Admitting: Cardiology

## 2021-07-12 ENCOUNTER — Telehealth: Payer: Self-pay | Admitting: Family Medicine

## 2021-07-12 DIAGNOSIS — E782 Mixed hyperlipidemia: Secondary | ICD-10-CM

## 2021-07-12 DIAGNOSIS — Z79899 Other long term (current) drug therapy: Secondary | ICD-10-CM

## 2021-07-12 DIAGNOSIS — Z0000 Encounter for general adult medical examination without abnormal findings: Secondary | ICD-10-CM

## 2021-07-12 NOTE — Telephone Encounter (Signed)
Last labs completed 05/17/20: PSA, HEPATIC, BMET, LIPID. Please advise. Thank you

## 2021-07-12 NOTE — Telephone Encounter (Signed)
Patient has physical on 2/28 and needing labs done.

## 2021-07-14 NOTE — Telephone Encounter (Signed)
Lab orders placed. Left message to return call  

## 2021-07-19 NOTE — Telephone Encounter (Signed)
Pt contacted and verbalized understanding.  

## 2021-08-01 ENCOUNTER — Encounter: Payer: PPO | Admitting: Family Medicine

## 2021-08-01 LAB — CMP14+EGFR
ALT: 15 IU/L (ref 0–44)
AST: 17 IU/L (ref 0–40)
Albumin/Globulin Ratio: 2.6 — ABNORMAL HIGH (ref 1.2–2.2)
Albumin: 4.7 g/dL (ref 3.8–4.8)
Alkaline Phosphatase: 72 IU/L (ref 44–121)
BUN/Creatinine Ratio: 11 (ref 10–24)
BUN: 12 mg/dL (ref 8–27)
Bilirubin Total: 0.4 mg/dL (ref 0.0–1.2)
CO2: 27 mmol/L (ref 20–29)
Calcium: 9.2 mg/dL (ref 8.6–10.2)
Chloride: 100 mmol/L (ref 96–106)
Creatinine, Ser: 1.1 mg/dL (ref 0.76–1.27)
Globulin, Total: 1.8 g/dL (ref 1.5–4.5)
Glucose: 95 mg/dL (ref 70–99)
Potassium: 5.1 mmol/L (ref 3.5–5.2)
Sodium: 140 mmol/L (ref 134–144)
Total Protein: 6.5 g/dL (ref 6.0–8.5)
eGFR: 74 mL/min/{1.73_m2} (ref >59)

## 2021-08-01 LAB — CBC WITH DIFFERENTIAL/PLATELET
Basophils Absolute: 0.1 10*3/uL (ref 0.0–0.2)
Basos: 1 %
EOS (ABSOLUTE): 0.5 10*3/uL — ABNORMAL HIGH (ref 0.0–0.4)
Eos: 7 %
Hematocrit: 41.2 % (ref 37.5–51.0)
Hemoglobin: 13.9 g/dL (ref 13.0–17.7)
Immature Grans (Abs): 0 10*3/uL (ref 0.0–0.1)
Immature Granulocytes: 0 %
Lymphocytes Absolute: 2.4 10*3/uL (ref 0.7–3.1)
Lymphs: 31 %
MCH: 30.2 pg (ref 26.6–33.0)
MCHC: 33.7 g/dL (ref 31.5–35.7)
MCV: 89 fL (ref 79–97)
Monocytes Absolute: 0.8 10*3/uL (ref 0.1–0.9)
Monocytes: 10 %
Neutrophils Absolute: 4 10*3/uL (ref 1.4–7.0)
Neutrophils: 51 %
Platelets: 258 10*3/uL (ref 150–450)
RBC: 4.61 x10E6/uL (ref 4.14–5.80)
RDW: 13.1 % (ref 11.6–15.4)
WBC: 7.8 10*3/uL (ref 3.4–10.8)

## 2021-08-01 LAB — LIPID PANEL
Chol/HDL Ratio: 3.2 ratio (ref 0.0–5.0)
Cholesterol, Total: 163 mg/dL (ref 100–199)
HDL: 51 mg/dL (ref >39)
LDL Chol Calc (NIH): 86 mg/dL (ref 0–99)
Triglycerides: 149 mg/dL (ref 0–149)
VLDL Cholesterol Cal: 26 mg/dL (ref 5–40)

## 2021-08-17 ENCOUNTER — Ambulatory Visit (INDEPENDENT_AMBULATORY_CARE_PROVIDER_SITE_OTHER): Payer: PPO | Admitting: Family Medicine

## 2021-08-17 ENCOUNTER — Other Ambulatory Visit: Payer: Self-pay

## 2021-08-17 VITALS — BP 139/87 | HR 74 | Temp 98.2°F | Ht 69.0 in | Wt 181.0 lb

## 2021-08-17 DIAGNOSIS — Z23 Encounter for immunization: Secondary | ICD-10-CM | POA: Diagnosis not present

## 2021-08-17 DIAGNOSIS — L409 Psoriasis, unspecified: Secondary | ICD-10-CM | POA: Diagnosis not present

## 2021-08-17 DIAGNOSIS — Z0000 Encounter for general adult medical examination without abnormal findings: Secondary | ICD-10-CM

## 2021-08-17 DIAGNOSIS — J449 Chronic obstructive pulmonary disease, unspecified: Secondary | ICD-10-CM

## 2021-08-17 MED ORDER — ALBUTEROL SULFATE HFA 108 (90 BASE) MCG/ACT IN AERS: 1.0000 | INHALATION_SPRAY | Freq: Four times a day (QID) | RESPIRATORY_TRACT | 1 refills | Status: DC | PRN

## 2021-08-17 NOTE — Patient Instructions (Signed)
Continue your current medications. ? ?If you continue to have issues with wheezing, please let me know and we will start a controller medication. ? ?Follow up in 6 months. ? ?Take care ? ?Dr. Lacinda Axon  ?

## 2021-08-17 NOTE — Progress Notes (Addendum)
Subjective:   [name] [name] is a 66 y.o. male who presents for Medicare Annual/Subsequent preventive examination.  Patient states that he is doing fairly well at this time.  He does note recent wheezing.  He is using his albuterol but is in need of an additional prescription.  He uses this once nightly.  Patient has underlying coronary disease, COPD, osteoporosis.  He is followed closely regarding his history of prostate cancer.  Patient also follows closely with physician who manages his chronic pain.  Review of Systems:  Patient reports recent wheezing.       Objective:    Today's Vitals   08/17/21 1343  BP: 139/87  Pulse: 74  Temp: 98.2 F (36.8 C)  SpO2: 99%  Weight: 181 lb (82.1 kg)  Height: '5\' 9"'$  (1.753 m)   Body mass index is 26.73 kg/m.  Physical Exam   Gen.: Well-appearing, no acute distress. HENT: Normocephalic atraumatic. Normal TMs bilaterally. Oropharynx clear.  Eyes: No scleral icterus. Normal conjunctiva. Neck: Supple. No thyromegaly or adenopathy. Heart: Regular rate and rhythm. No murmurs appreciated. No lower extremity edema. Lungs: Wheezing noted. Abdomen: Soft, nontender, nondistended. No palpable organomegaly. No rebound or guarding. MSK: Normal range of motion. Neuro: No focal deficits. Psych: Normal mood and affect.      07/17/2018    9:29 AM 06/29/2016    1:18 PM 06/18/2016    2:06 PM 03/16/2016    1:12 PM 12/15/2015    3:44 PM 11/12/2014    9:31 PM 08/19/2014   12:04 AM  Advanced Directives  Does Patient Have a Medical Advance Directive? No No No No No No No  Would patient like information on creating a medical advance directive? No - Patient declined No - Patient declined Yes (MAU/Ambulatory/Procedural Areas - Information given) No - patient declined information Yes - Educational materials given  No - patient declined information    Current Medications (verified) Outpatient Encounter Medications as of 08/17/2021  Medication Sig    ALPRAZolam (XANAX) 0.5 MG tablet TAKE 1 TABLET BY MOUTH FOUR TIMES DAILY AS NEEDED FOR ANXIETY   aspirin EC 81 MG tablet Take 81 mg by mouth daily. Swallow whole.   Elastic Bandages & Supports (LUMBAR BACK BRACE/SUPPORT PAD) MISC 1 each by Does not apply route as directed.   levofloxacin (LEVAQUIN) 500 MG tablet Take 500 mg by mouth daily. (Patient not taking: Reported on 11/28/2021)   oxyCODONE (OXYCONTIN) 60 MG 12 hr tablet Take 60 mg by mouth every 8 (eight) hours.   Oxycodone HCl 10 MG TABS Take 1-2 tablets (10-20 mg total) by mouth every 4 (four) hours as needed.   potassium chloride SA (KLOR-CON M) 20 MEQ tablet Take by mouth. (Patient not taking: Reported on 11/28/2021)   [DISCONTINUED] albuterol (VENTOLIN HFA) 108 (90 Base) MCG/ACT inhaler INHLAE 2 PUFF EVERY 2- 4 HOURS WITH AEROCHAMBER.   [DISCONTINUED] fluticasone (FLONASE) 50 MCG/ACT nasal spray PLACE (1) SPRAY INTO BOTH NOSTRILS DAILY.   [DISCONTINUED] nitroGLYCERIN (NITROSTAT) 0.4 MG SL tablet Place 1 tablet (0.4 mg total) under the tongue every 5 (five) minutes as needed for chest pain.   [DISCONTINUED] pantoprazole (PROTONIX) 40 MG tablet TAKE 1 TABLET BY MOUTH ONCE DAILY.   [DISCONTINUED] rosuvastatin (CRESTOR) 10 MG tablet TAKE ONE TABLET BY MOUTH ONCE DAILY.   [DISCONTINUED] albuterol (VENTOLIN HFA) 108 (90 Base) MCG/ACT inhaler Inhale 1-2 puffs into the lungs every 6 (six) hours as needed for wheezing or shortness of breath.   [DISCONTINUED] benzonatate (TESSALON) 100 MG  capsule Take 1 capsule (100 mg total) by mouth 2 (two) times daily as needed for cough. (Patient not taking: Reported on 08/17/2021)   [DISCONTINUED] pantoprazole (PROTONIX) 20 MG tablet Take 20 mg by mouth daily. (Patient not taking: Reported on 08/17/2021)   No facility-administered encounter medications on file as of 08/17/2021.    Allergies (verified) Other, Zoledronic acid, and Pamidronate   History: Past Medical History:  Diagnosis Date   Anxiety     Asthma    COPD (chronic obstructive pulmonary disease) ([facility])    Coronary artery disease    DES to LAD 1/12   DDD (degenerative disc disease)    Depression    Drug reaction    Pamidronate - induced skin toxicity, Zometa - induced toxicity   Essential hypertension    Gastritis    GERD (gastroesophageal reflux disease)    Horseshoe kidney    Single kidney   Internal hemorrhoids    Colonoscopy 1/12   Myocardial infarction [facility])    2012   Osteoarthritis    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   Psoriasis    Sigmoid diverticulosis    Tubular adenoma    Colonoscopy 1/12   Past Surgical History:  Procedure Laterality Date   ANTERIOR FUSION CERVICAL SPINE     APPENDECTOMY     COLONOSCOPY  08/24/2019   ETHMOIDECTOMY Left 09/28/2013   Procedure: LEFT ENDOSCOPIC ANTERIOR ETHMOIDECTOMY;  Surgeon: [name], MD;  Location: [facility];  Service: ENT;  Laterality: Left;   HEART STENTS  06/2010   2   LEFT FOOT SURGERY     MAXILLARY ANTROSTOMY Left 09/28/2013   Procedure: LEFT ENDOSCOPIC MAXILLARY ANTROSTOMY WITH REMOVAL OF TISSUE;  Surgeon: [name], MD;  Location: [facility];  Service: ENT;  Laterality: Left;   POSTERIOR FUSION CERVICAL SPINE     PROSTATECTOMY  02/28/2006   [name] in [place], [place].   Family History  Problem Relation Age of Onset   Lung cancer Father    Heart attack Brother    Alzheimer's disease Mother    Colon cancer Maternal Uncle    Esophageal cancer Neg Hx    Stomach cancer Neg Hx    Rectal cancer Neg Hx    Social History   Socioeconomic History   Marital status: Legally Separated    Spouse name: Not on file   Number of children: 3   Years of education: Not on file   Highest education level: Not on file  Occupational History   Occupation: Part-time, small jobs    Comment: Disable, Environmental health practitioner: UNEMPLOYED  Tobacco Use   Smoking status:  Former    Packs/day: 1.00    Years: 15.00    Total pack years: 15.00    Types: Cigarettes    Quit date: 02/20/2006    Years since quitting: 15.8   Smokeless tobacco: Never  Vaping Use   Vaping Use: Never used  Substance and Sexual Activity   Alcohol use: No    Alcohol/week: 0.0 standard drinks of alcohol    Comment: Quit in 2007. Previous 12 pack of beer per week.   Drug use: No   Sexual activity: Never  Other Topics Concern   Not on file  Social History Narrative   Not on file   Social Determinants of Health   Financial Resource Strain: Not  on file  Food Insecurity: Not on file  Transportation Needs: Not on file  Physical Activity: Not on file  Stress: Not on file  Social Connections: Not on file    Tobacco Counseling Patient has quit smoking as of years ago.  Activities of Daily Living     No data to display          Patient Care Team: Coral Spikes, DO as PCP - General (Family Medicine) Satira Sark, MD as PCP - Cardiology (Cardiology) Lenn Cal, DDS (Inactive) as Consulting Physician (Dentistry) Leta Baptist, MD as Consulting Physician (Otolaryngology)  Indicate any recent Medical Services you may have received from other than [facility] providers in the past year (date may be approximate). Patient sees a physician regarding his chronic pain.  [facility].    Assessment:   This is a routine wellness examination for [place].  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  Sees a eye specialist yearly regarding his vision.  Depression Screen    08/17/2021    1:47 PM 05/30/2020   10:12 AM 05/12/2019    1:39 PM 07/17/2018    9:18 AM 08/26/2017    1:39 PM  PHQ 2/9 Scores  PHQ - 2 Score 0 0 0 1 1  PHQ- 9 Score  8       Fall Risk    08/17/2021    1:47 PM 05/30/2020    9:35 AM 11/10/2019    2:13 PM 05/12/2019    1:39 PM 08/26/2017    1:39 PM  [place] in the past year? 0 1 0 0 No   Number falls in past yr: 0 0     Injury with Fall? 0 0     Risk for fall due to : No Fall Risks      Follow up Falls evaluation completed Falls evaluation completed Falls evaluation completed      [place]: Patient is not a fall risk at this time.  He ambulates unassisted without difficulty.  ASSISTIVE DEVICES UTILIZED TO PREVENT FALLS: No need for assistive devices at this time.  Cognitive Function: Mini cog normal.       Immunizations Immunization History  Administered Date(s) Administered   Fluad Quad(high Dose 65+) 03/12/2020   Influenza Split 04/30/2011, 07/04/2012, 03/11/2013   Influenza,inj,Quad PF,6+ Mos 03/01/2014, 03/08/2015, 03/16/2016, 03/15/2017, 03/04/2018, 02/28/2019   Influenza-Unspecified 03/17/2020   Moderna Sars-Covid-2 Vaccination 08/06/2019, 09/11/2019, 05/05/2020   PNEUMOCOCCAL CONJUGATE-20 08/17/2021   Pneumococcal Polysaccharide-23 07/01/2018   Tdap 11/12/2014, 03/28/2019   Zoster Recombinat (Shingrix) 10/31/2017, 05/18/2018    TDAP status: Up to date  Flu vaccine is due.  We do not have any vaccines available in our clinic at this time.  There has been limited flu as of late in our community.  We will need vaccine at the start of flu season later this year.  Pneumococcal vaccine status: Completed during today's visit.  COVID-19 vaccine status -patient has had 3 vaccinations.  Shingrix has been completed.  Screening Tests Health Maintenance  Topic Date Due   COVID-19 Vaccine (4 - Booster for Moderna series) 06/30/2020   INFLUENZA VACCINE  01/02/2022   COLONOSCOPY (Pts 45-21yr Insurance coverage will need to be confirmed)  08/24/2026   TETANUS/TDAP  03/27/2029   Pneumonia Vaccine 66 Years old  Completed   Hepatitis C Screening  Completed   Zoster Vaccines- Shingrix  Completed   HPV VACCINES  Aged Out    Colorectal cancer screening: Up-to-date.  Lung Cancer Screening: Patient quit 15 years ago.  Not  indicated at this time.  Follows with oncology.  Additional Screening:  Hepatitis C Screening: Has been completed.  Vision Screening: Recommended annual ophthalmology exams for early detection of glaucoma and other disorders of the eye.  Dental Screening: Patient is in edentulous    Plan:     I have personally reviewed and noted the following in the patient's chart:   Medical and social history Use of alcohol, tobacco or illicit drugs  Current medications and supplements Functional ability and status Nutritional status Physical activity Advanced directives List of other physicians Hospitalizations, surgeries, and ER visits in previous 12 months Vitals Screenings to include cognitive, depression, and falls Referrals and appointments  In addition, I have reviewed and discussed with patient certain preventive protocols, quality metrics, and best practice recommendations. A written personalized care plan for preventive services as well as general preventive health recommendations were provided to patient.    Coral Spikes, DO   01/08/2022

## 2021-09-01 ENCOUNTER — Telehealth: Payer: Self-pay

## 2021-09-01 ENCOUNTER — Other Ambulatory Visit: Payer: Self-pay | Admitting: Nurse Practitioner

## 2021-09-01 DIAGNOSIS — J449 Chronic obstructive pulmonary disease, unspecified: Secondary | ICD-10-CM

## 2021-09-01 MED ORDER — PREDNISONE 20 MG PO TABS: ORAL_TABLET | ORAL | 0 refills | Status: DC

## 2021-09-01 MED ORDER — AZITHROMYCIN 250 MG PO TABS: ORAL_TABLET | ORAL | 0 refills | Status: DC

## 2021-09-01 NOTE — Telephone Encounter (Signed)
Nurses-please talk with [name] [name] regarding how he is doing what type of symptoms he is having in terms of coughing wheezing shortness of breath?  Also in regards to fever?  Also in regards to productive cough? ?What is he been utilizing currently? ?It is important to run this message by me if I do not respond by [phone] please remind me to do so thank you ?

## 2021-09-01 NOTE — Telephone Encounter (Signed)
Pt not having any shortness of breath but states he is always short winded due to COPD, non productive cough(unable to cough up anything), some wheezing, congestion, no fever. Has used half of his inhaler (states it was 200 puff now he is on 78) also has been using Flonase nasal spray. Please advise. Thank you.  ?

## 2021-09-01 NOTE — Telephone Encounter (Signed)
I would recommend prednisone 20 mg tablet, 3 tablets/day for 2 days, then 2 tablets a day for 2 days then 1 tablet/day for 2 days, #12 ?Also Z-Pak ?Please put on the prescription for COPD exacerbation ?If ongoing troubles problems or issues follow-up with [place] if severe shortness of breath or severe issues go to ER ?

## 2021-09-01 NOTE — Addendum Note (Signed)
Addended by: Vicente Males on: 09/01/2021 04:22 PM ? ? Modules accepted: Orders ? ?

## 2021-09-01 NOTE — Telephone Encounter (Signed)
Medications sent to pharmacy and pt is aware. Pt verbalized understanding ?

## 2021-09-13 ENCOUNTER — Other Ambulatory Visit: Payer: Self-pay | Admitting: Family Medicine

## 2021-09-13 DIAGNOSIS — J449 Chronic obstructive pulmonary disease, unspecified: Secondary | ICD-10-CM

## 2021-09-19 ENCOUNTER — Other Ambulatory Visit: Payer: Self-pay | Admitting: Cardiology

## 2021-09-27 ENCOUNTER — Other Ambulatory Visit: Payer: Self-pay | Admitting: Family Medicine

## 2021-09-27 DIAGNOSIS — J449 Chronic obstructive pulmonary disease, unspecified: Secondary | ICD-10-CM

## 2021-10-04 IMAGING — NM NM HEPATO W/GB/PHARM/[PERSON_NAME]
2 series · 12 of 12 positions shown · non-contrast
Comparison: None

CLINICAL DATA: RIGHT upper quadrant pain with nausea for 6 weeks

EXAM:
NUCLEAR MEDICINE HEPATOBILIARY IMAGING WITH GALLBLADDER EF
TECHNIQUE: Sequential images of the abdomen were obtained [DATE] minutes
following intravenous administration of radiopharmaceutical. After
oral ingestion of Ensure, gallbladder ejection fraction was
determined. At 60 min, normal ejection fraction is greater than 33%.
RADIOPHARMACEUTICALS:  5.2 mCi 8c-IIm  Choletec IV

[Series 1: biliary · 3.25mm/px · 6 of 60 frames shown]
[frame 6/60]
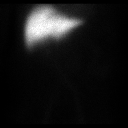
[frame 16/60]
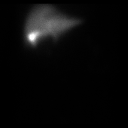
[frame 26/60]
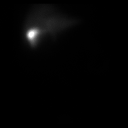
[frame 36/60]
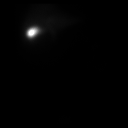
[frame 46/60]
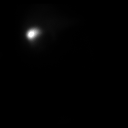
[frame 56/60]
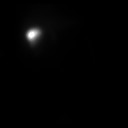

[Series 2: gbef · 3.25mm/px · 6 of 60 frames shown]
[frame 6/60]
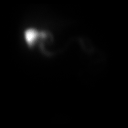
[frame 16/60]
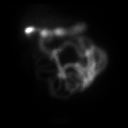
[frame 26/60]
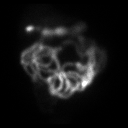
[frame 36/60]
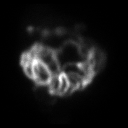
[frame 46/60]
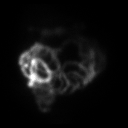
[frame 56/60]
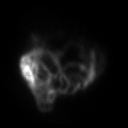

[12 of 12 positions shown; findings below may reference images not displayed]

FINDINGS: Normal tracer extraction from bloodstream indicating normal
hepatocellular function.

Normal excretion of tracer into biliary tree.

Gallbladder visualized at 12 min.

Small bowel visualized at 13 min.

No hepatic retention of tracer.

Subjectively normal emptying of tracer from gallbladder following
fatty meal stimulation.

Calculated gallbladder ejection fraction is 99%, normal.

Patient reported no symptoms following Ensure ingestion.

Normal gallbladder ejection fraction following Ensure ingestion is
greater than 33% at 1 hour.
IMPRESSION: Normal exam.

## 2021-11-06 ENCOUNTER — Other Ambulatory Visit: Payer: Self-pay | Admitting: Cardiology

## 2021-11-28 ENCOUNTER — Encounter: Payer: Self-pay | Admitting: Cardiology

## 2021-11-28 ENCOUNTER — Ambulatory Visit (INDEPENDENT_AMBULATORY_CARE_PROVIDER_SITE_OTHER): Payer: PPO | Admitting: Cardiology

## 2021-11-28 VITALS — BP 116/88 | HR 62 | Ht 69.0 in | Wt 193.6 lb

## 2021-11-28 DIAGNOSIS — E782 Mixed hyperlipidemia: Secondary | ICD-10-CM | POA: Diagnosis not present

## 2021-11-28 DIAGNOSIS — I25119 Atherosclerotic heart disease of native coronary artery with unspecified angina pectoris: Secondary | ICD-10-CM

## 2021-11-28 DIAGNOSIS — I10 Essential (primary) hypertension: Secondary | ICD-10-CM | POA: Diagnosis not present

## 2021-11-28 MED ORDER — PANTOPRAZOLE SODIUM 40 MG PO TBEC: 40.0000 mg | DELAYED_RELEASE_TABLET | Freq: Every day | ORAL | 3 refills | Status: DC

## 2021-11-28 MED ORDER — ROSUVASTATIN CALCIUM 10 MG PO TABS: 10.0000 mg | ORAL_TABLET | Freq: Every day | ORAL | 3 refills | Status: DC

## 2021-12-14 ENCOUNTER — Other Ambulatory Visit: Payer: Self-pay | Admitting: Family Medicine

## 2021-12-14 DIAGNOSIS — J449 Chronic obstructive pulmonary disease, unspecified: Secondary | ICD-10-CM

## 2022-01-10 ENCOUNTER — Encounter: Payer: Self-pay | Admitting: Family Medicine

## 2022-02-19 ENCOUNTER — Ambulatory Visit: Payer: Self-pay | Admitting: Family Medicine

## 2022-04-01 ENCOUNTER — Other Ambulatory Visit: Payer: Self-pay | Admitting: Family Medicine

## 2022-04-01 DIAGNOSIS — J449 Chronic obstructive pulmonary disease, unspecified: Secondary | ICD-10-CM

## 2022-06-20 ENCOUNTER — Other Ambulatory Visit: Payer: Self-pay | Admitting: Family Medicine

## 2022-06-20 DIAGNOSIS — J449 Chronic obstructive pulmonary disease, unspecified: Secondary | ICD-10-CM

## 2022-09-05 ENCOUNTER — Telehealth: Payer: Self-pay | Admitting: Cardiology

## 2022-09-05 NOTE — Telephone Encounter (Signed)
Pt would like a callback to find out if he's able to have a Spirometry test done in the office per requested from the [facility] and he's unsure about where he's supposed to schedule to have it done. Please advise.

## 2022-09-05 NOTE — Telephone Encounter (Signed)
Requesting spirometry testing be ordered by cardiologist and that its needed for DMV in order to get his drivers license back. Recommended he request this from his PCP but says he no longer has a PCP and the office he was going to is no longer excepting new patients. Gave number to call [facility]  [phone]) to request establishment. Also advised that [place] was located in same office. Verbalized understanding.

## 2022-09-06 ENCOUNTER — Ambulatory Visit: Payer: PPO | Admitting: Internal Medicine

## 2022-09-12 ENCOUNTER — Ambulatory Visit: Payer: PPO | Admitting: Internal Medicine

## 2022-09-18 ENCOUNTER — Ambulatory Visit: Payer: PPO | Admitting: Internal Medicine

## 2022-12-13 ENCOUNTER — Other Ambulatory Visit: Payer: Self-pay | Admitting: Cardiology

## 2022-12-13 ENCOUNTER — Telehealth: Payer: Self-pay | Admitting: Cardiology

## 2022-12-13 MED ORDER — ROSUVASTATIN CALCIUM 10 MG PO TABS: 10.0000 mg | ORAL_TABLET | Freq: Every day | ORAL | 0 refills | Status: DC

## 2022-12-13 NOTE — Telephone Encounter (Signed)
*  STAT* If patient is at the pharmacy, call can be transferred to refill team.   1. Which medications need to be refilled? (please list name of each medication and dose if known) Rosuvastatin  2. Which pharmacy/location (including street and city if local pharmacy) is medication to be sent to?[facility] [address], [place]  3. Do they need a 30 day or 90 day supply? 90 days and refills- he needs this today please, he is completely out

## 2022-12-13 NOTE — Telephone Encounter (Signed)
1 month supply filled. Patient needs an appointment for further refills 1st attempt.

## 2023-01-08 ENCOUNTER — Telehealth: Payer: Self-pay | Admitting: Cardiology

## 2023-01-08 MED ORDER — ROSUVASTATIN CALCIUM 10 MG PO TABS: 10.0000 mg | ORAL_TABLET | Freq: Every day | ORAL | 0 refills | Status: DC

## 2023-01-08 NOTE — Addendum Note (Signed)
Addended by: Lajoyce Lauber on: 01/08/2023 12:13 PM   Modules accepted: Orders

## 2023-01-08 NOTE — Telephone Encounter (Signed)
*  STAT* If patient is at the pharmacy, call can be transferred to refill team.   1. Which medications need to be refilled? (please list name of each medication and dose if known) rosuvastatin (CRESTOR) 10 MG tablet  2. Which pharmacy/location (including street and city if local pharmacy) is medication to be sent to? [facility] - [place], [place] [facility] [place] [address]  3. Do they need a 30 day or 90 day supply?  90 day supply

## 2023-01-09 NOTE — Progress Notes (Unsigned)
    Cardiology Office Note  Date: 01/10/2023   ID: [name] [name], DOB 07-13-1955, MRN [id number]  History of Present Illness: [name] [name] is a 67 y.o. male last seen in June 2023.  He is here for a follow-up visit.  Reports no angina or nitroglycerin use, stable NYHA class I dyspnea.  No palpitations or syncope.  He continues to work at [employer] here in [place].  I reviewed his medications.  Discussed refills for nitroglycerin and Crestor.  He is also due for a follow-up fasting lipid profile.  His LDL was 86 in February of last year.  ECG today shows normal sinus rhythm.  Physical Exam: VS:  BP 112/70   Pulse 73   Ht 5\' 9"  (1.753 m)   Wt 164 lb 6.4 oz (74.6 kg)   SpO2 97%   BMI 24.28 kg/m , BMI Body mass index is 24.28 kg/m.  Wt Readings from Last 3 Encounters:  01/10/23 164 lb 6.4 oz (74.6 kg)  11/28/21 193 lb 9.6 oz (87.8 kg)  08/17/21 181 lb (82.1 kg)    General: Patient appears comfortable at rest. HEENT: Conjunctiva and lids normal. Neck: Supple, no elevated JVP or carotid bruits. Lungs: Clear to auscultation, nonlabored breathing at rest. Cardiac: Regular rate and rhythm, no S3 or significant systolic murmur. Extremities: No pitting edema.  ECG:  An ECG dated 12/28/2021 was personally reviewed today and demonstrated:  Sinus bradycardia.  Labwork: February 2023: BUN 12, creatinine 1.1, potassium 5.1, AST 17, ALT 15    Component Value Date/Time   CHOL 163 07/31/2021 1531   TRIG 149 07/31/2021 1531   HDL 51 07/31/2021 1531   CHOLHDL 3.2 07/31/2021 1531   CHOLHDL 3.2 03/17/2014 1245   VLDL 30 03/17/2014 1245   LDLCALC 86 07/31/2021 1531  April 2024: Hemoglobin 13.3, platelets 229  Other Studies Reviewed Today:  No interval cardiac testing for review today.  Assessment and Plan:  1.  CAD status post myocardial infarction in 2012 with DES to the LAD.  He continues to do well without active angina or nitroglycerin use.  Continue aspirin, Crestor, and as  needed nitroglycerin.  2.  Essential hypertension.  Blood pressure is normal today.  3.  Mixed hyperlipidemia.  LDL 86 in February 2023 on Crestor.  Refill provided for Crestor 10 mg daily, however will recheck FLP as he may need further adjustments going forward.  Disposition:  Follow up  1 year.  Signed, Jonelle Sidle, M.D., F.A.C.C. [facility] at [facility]

## 2023-01-10 ENCOUNTER — Encounter (HOSPITAL_COMMUNITY): Payer: Self-pay | Admitting: Oncology

## 2023-01-10 ENCOUNTER — Encounter: Payer: Self-pay | Admitting: Cardiology

## 2023-01-10 ENCOUNTER — Other Ambulatory Visit (HOSPITAL_COMMUNITY)
Admission: RE | Admit: 2023-01-10 | Discharge: 2023-01-10 | Disposition: A | Discharge status: Home / Self Care | Payer: PPO | Source: Ambulatory Visit | Attending: Cardiology | Admitting: Cardiology

## 2023-01-10 ENCOUNTER — Ambulatory Visit: Payer: PPO | Attending: Cardiology | Admitting: Cardiology

## 2023-01-10 VITALS — BP 112/70 | HR 73 | Ht 69.0 in | Wt 164.4 lb

## 2023-01-10 DIAGNOSIS — I25119 Atherosclerotic heart disease of native coronary artery with unspecified angina pectoris: Secondary | ICD-10-CM | POA: Insufficient documentation

## 2023-01-10 DIAGNOSIS — I10 Essential (primary) hypertension: Secondary | ICD-10-CM

## 2023-01-10 DIAGNOSIS — E782 Mixed hyperlipidemia: Secondary | ICD-10-CM | POA: Diagnosis not present

## 2023-01-10 LAB — LIPID PANEL
Cholesterol: 103 mg/dL (ref 0–200)
HDL: 43 mg/dL (ref >40)
LDL Cholesterol: 54 mg/dL (ref 0–99)
Total CHOL/HDL Ratio: 2.4 RATIO
Triglycerides: 30 mg/dL (ref <150)
VLDL: 6 mg/dL (ref 0–40)

## 2023-01-10 MED ORDER — ROSUVASTATIN CALCIUM 10 MG PO TABS: 10.0000 mg | ORAL_TABLET | Freq: Every day | ORAL | 1 refills | Status: DC

## 2023-01-10 MED ORDER — NITROGLYCERIN 0.4 MG SL SUBL: 0.4000 mg | SUBLINGUAL_TABLET | SUBLINGUAL | 3 refills | Status: DC | PRN

## 2023-01-10 NOTE — Patient Instructions (Signed)
Medication Instructions:   Your physician recommends that you continue on your current medications as directed. Please refer to the Current Medication list given to you today.  I refilled your NTG and Crestor   Labwork:  Fasting Lipids  Testing/Procedures: None today  Follow-Up: 1 year  Any Other Special Instructions Will Be Listed Below (If Applicable).  If you need a refill on your cardiac medications before your next appointment, please call your pharmacy.

## 2023-01-16 ENCOUNTER — Telehealth: Payer: Self-pay | Admitting: Cardiology

## 2023-01-16 MED ORDER — ROSUVASTATIN CALCIUM 10 MG PO TABS: 10.0000 mg | ORAL_TABLET | Freq: Every day | ORAL | 3 refills | Status: AC

## 2023-01-16 NOTE — Telephone Encounter (Signed)
*  STAT* If patient is at the pharmacy, call can be transferred to refill team.   1. Which medications need to be refilled? (please list name of each medication and dose if known) rosuvastatin (CRESTOR) 10 MG tablet   2. Which pharmacy/location (including street and city if local pharmacy) is medication to be sent to? [facility] - [place], [place] - [place] [address]   3. Do they need a 30 day or 90 day supply? 90   Patient was seen on 8/9

## 2023-01-16 NOTE — Telephone Encounter (Signed)
Refill complete. Medication sent to [facility].

## 2023-05-07 ENCOUNTER — Telehealth: Payer: Self-pay | Admitting: *Deleted

## 2023-05-07 NOTE — Patient Outreach (Signed)
  Care Coordination   05/07/2023  Name: [name] [name] MRN: [id number] DOB: Sep 27, 1955   Care Coordination Outreach Attempts:  An unsuccessful telephone outreach was attempted today to offer the patient information about available care coordination services. HIPAA compliant message left on voicemail providing contact information for CSW, encouraging patient to return CSW's call at his earliest convenience.  Follow Up Plan:  Additional outreach attempts will be made to offer the patient care coordination information and services.   Encounter Outcome:  No Answer.   Care Coordination Interventions:  No, not indicated.    Danford Bad, BSW, MSW, Printmaker Social Work Case Set designer Health  [facility], Population Health Direct Dial: [phone]  Fax: [phone] Email: [email or web address]: [email or web address]

## 2023-05-13 ENCOUNTER — Telehealth: Payer: Self-pay | Admitting: *Deleted

## 2023-05-13 NOTE — Patient Outreach (Signed)
  Care Coordination   05/13/2023  Name: [name] [name] MRN: [id number] DOB: 09/13/1955   Care Coordination Outreach Attempts:  A second unsuccessful outreach was attempted today to offer the patient with information about available care coordination services. HIPAA compliant message left on voicemail providing contact information for CSW, encouraging patient to return CSW's call at his earliest convenience.  Follow Up Plan:  Additional outreach attempts will be made to offer the patient care coordination information and services.   Encounter Outcome:  No Answer.   Care Coordination Interventions:  No, not indicated.    Danford Bad, BSW, MSW, Printmaker Social Work Case Set designer Health  [facility], Population Health Direct Dial: [phone]  Fax: [phone] Email: [email or web address]: [email or web address]

## 2023-05-17 ENCOUNTER — Telehealth: Payer: Self-pay | Admitting: *Deleted

## 2023-05-17 NOTE — Patient Outreach (Signed)
  Care Coordination   05/17/2023  Name: [name] [name] MRN: [id number] DOB: 04/01/1956   Care Coordination Outreach Attempts:  A third unsuccessful outreach was attempted today to offer the patient with information about available care coordination services. HIPAA compliant messages left on voicemail providing contact information for CSW, encouraging patient to return CSW's call at his earliest convenience.  Follow Up Plan:  No further outreach attempts will be made at this time. We have been unable to contact the patient to offer or enroll patient in care coordination services.  Encounter Outcome:  No Answer.   Care Coordination Interventions:  No, not indicated.    Danford Bad, BSW, MSW, Printmaker Social Work Case Set designer Health  [facility], Population Health Direct Dial: [phone]  Fax: [phone] Email: [email or web address]: [email or web address]

## 2023-12-02 ENCOUNTER — Telehealth: Payer: Self-pay | Admitting: Cardiology

## 2023-12-02 NOTE — Telephone Encounter (Signed)
   Pt c/o of Chest Pain: STAT if active CP, including tightness, pressure, jaw pain, radiating pain to shoulder/upper arm/back, CP unrelieved by Nitro. Symptoms reported of SOB, nausea, vomiting, sweating.  1. Are you having CP right now? YES    2. Are you experiencing any other symptoms (ex. SOB, nausea, vomiting, sweating)? YES  chest pains, nausea    3. Is your CP continuous or coming and going? yes   4. Have you taken Nitroglycerin ? yes   5. How long have you been experiencing CP? All day    6. If NO CP at time of call then end call with telling Pt to call back or call 911 if Chest pain returns prior to return call from triage team.

## 2023-12-02 NOTE — Telephone Encounter (Signed)
 Patient c/o active chest pain 7-8/10 & has taken 3 NTG without relief.  EMS called for transport to ED for evaluation.  No provider in office at this time.

## 2024-04-24 ENCOUNTER — Telehealth: Payer: Self-pay | Admitting: Cardiology

## 2024-04-24 NOTE — Telephone Encounter (Signed)
 Pt calling to schedule annual appt. Pt felt his January appt was too far out, and state he hopes he does not have a heart attack and die before then. Please advise.

## 2024-04-24 NOTE — Telephone Encounter (Signed)
 Contacted to check symptom status.  Denies chest pain, dizziness or sob.  Report she is not having any cardiac problems. Advised that he has been added to the wait list to be contacted for a sooner appointment if we have a cancellation. Verbalized understanding,.

## 2024-06-24 ENCOUNTER — Encounter: Payer: Self-pay | Admitting: Cardiology

## 2024-06-24 ENCOUNTER — Ambulatory Visit: Attending: Cardiology | Admitting: Cardiology

## 2024-06-24 VITALS — BP 130/84 | HR 68 | Ht 69.0 in | Wt 171.8 lb

## 2024-06-24 DIAGNOSIS — I10 Essential (primary) hypertension: Secondary | ICD-10-CM | POA: Diagnosis not present

## 2024-06-24 DIAGNOSIS — I25119 Atherosclerotic heart disease of native coronary artery with unspecified angina pectoris: Secondary | ICD-10-CM | POA: Diagnosis not present

## 2024-06-24 DIAGNOSIS — E782 Mixed hyperlipidemia: Secondary | ICD-10-CM | POA: Diagnosis not present

## 2024-06-24 MED ORDER — NITROGLYCERIN 0.4 MG SL SUBL: 0.4000 mg | SUBLINGUAL_TABLET | SUBLINGUAL | 3 refills | Status: AC | PRN

## 2024-06-24 NOTE — Progress Notes (Signed)
"  ° ° °  Cardiology Office Note  Date: 06/24/2024   ID: OMKAR STRATMANN, DOB 05-18-1956, MRN [id number]  History of Present Illness: [name] [name] is a 69 y.o. male last seen in August 2024.  He presents overdue for follow-up.  He remains clinically stable from a cardiac perspective, does not describe any angina or interval nitroglycerin  use.  No palpitations or syncope.  We went over his medications today.  No change from a cardiac perspective.  We did discuss getting a refill for a fresh bottle of nitroglycerin .  Lab work from May 2025 showed LDL 57.  His blood pressure is reasonably well-controlled today.  I reviewed his ECG today which shows normal sinus rhythm.  Physical Exam: VS:  BP 130/84 (BP Location: Right Arm)   Pulse 68   Ht 5' 9 (1.753 m)   Wt 171 lb 12.8 oz (77.9 kg)   SpO2 96%   BMI 25.37 kg/m , BMI Body mass index is 25.37 kg/m.  Wt Readings from Last 3 Encounters:  06/24/24 171 lb 12.8 oz (77.9 kg)  01/10/23 164 lb 6.4 oz (74.6 kg)  11/28/21 193 lb 9.6 oz (87.8 kg)    General: Patient appears comfortable at rest. HEENT: Conjunctiva and lids normal. Neck: Supple, no elevated JVP or carotid bruits. Lungs: Clear to auscultation, nonlabored breathing at rest. Cardiac: Regular rate and rhythm, no S3 or significant systolic murmur.  ECG:  An ECG dated August 2024 was personally reviewed today and demonstrated:  Sinus rhythm.  Labwork:  May 2025: Cholesterol 116, triglycerides 71, HDL 44, LDL 57 October 2025: BUN 5, creatinine 1.06, potassium 4.2, AST 15, ALT 12  Other Studies Reviewed Today:  No interval cardiac testing for review today.  Assessment and Plan:  1.  CAD status post myocardial infarction in 2012 with DES to the LAD.  He reports no angina or interval nitroglycerin  use, refill provided for fresh bottle.  Continue aspirin  81 mg daily and statin.  ECG reviewed.   2.  Primary hypertension.  Continue observation and keep follow-up with PCP.   3.   Mixed hyperlipidemia.  LDL 57 in May 2025.  Continue Crestor  10 mg daily.  Disposition:  Follow up 1 year.  Signed, Jayson JUDITHANN Sierras, M.D., F.A.C.C. [facility] at [facility]

## 2024-06-24 NOTE — Patient Instructions (Addendum)
